# Patient Record
Sex: Female | Born: 1970 | Race: White | Smoking: Never smoker
Health system: Northeastern US, Community
[De-identification: ages and names within clinical notes are randomized; demographics above are authoritative.]

## PROBLEM LIST (undated history)

## (undated) DIAGNOSIS — L719 Rosacea, unspecified: Secondary | ICD-10-CM

## (undated) DIAGNOSIS — B029 Zoster without complications: Secondary | ICD-10-CM

## (undated) DIAGNOSIS — E039 Hypothyroidism, unspecified: Secondary | ICD-10-CM

## (undated) DIAGNOSIS — F419 Anxiety disorder, unspecified: Secondary | ICD-10-CM

## (undated) DIAGNOSIS — F329 Major depressive disorder, single episode, unspecified: Secondary | ICD-10-CM

## (undated) DIAGNOSIS — F32A Depression, unspecified: Secondary | ICD-10-CM

## (undated) DIAGNOSIS — I10 Essential (primary) hypertension: Secondary | ICD-10-CM

## (undated) DIAGNOSIS — G90529 Complex regional pain syndrome I of unspecified lower limb: Secondary | ICD-10-CM

## (undated) HISTORY — DX: Hypothyroidism, unspecified: E03.9

## (undated) HISTORY — DX: Anxiety disorder, unspecified: F41.9

## (undated) HISTORY — PX: OB ANTEPARTUM CARE CESAREAN DLVR & POSTPARTUM: REP299

## (undated) HISTORY — PX: TOTAL ABDOMINAL HYSTERECT W/WO RMVL TUBE OVARY: REP152

## (undated) HISTORY — DX: Essential (primary) hypertension: I10

## (undated) HISTORY — DX: Rosacea, unspecified: L71.9

## (undated) HISTORY — PX: ROTATOR CUFF REPAIR: 100040

## (undated) HISTORY — DX: Major depressive disorder, single episode, unspecified: F32.9

## (undated) HISTORY — PX: ACL REPAIR: 100048

## (undated) HISTORY — DX: Depression, unspecified: F32.A

---
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
## 2009-06-17 ENCOUNTER — Encounter (HOSPITAL_BASED_OUTPATIENT_CLINIC_OR_DEPARTMENT_OTHER): Payer: Self-pay | Admitting: Internal Medicine

## 2009-06-17 ENCOUNTER — Telehealth (HOSPITAL_BASED_OUTPATIENT_CLINIC_OR_DEPARTMENT_OTHER): Payer: Self-pay

## 2009-06-17 ENCOUNTER — Ambulatory Visit (HOSPITAL_BASED_OUTPATIENT_CLINIC_OR_DEPARTMENT_OTHER): Payer: PRIVATE HEALTH INSURANCE | Admitting: Internal Medicine

## 2009-06-17 VITALS — BP 110/74 | HR 112 | Temp 98.7°F | Wt 148.8 lb

## 2009-06-17 DIAGNOSIS — Z0000 Encounter for general adult medical examination without abnormal findings: Secondary | ICD-10-CM

## 2009-06-17 DIAGNOSIS — IMO0002 Reserved for concepts with insufficient information to code with codable children: Secondary | ICD-10-CM

## 2009-06-17 DIAGNOSIS — N390 Urinary tract infection, site not specified: Secondary | ICD-10-CM

## 2009-06-17 DIAGNOSIS — Z717 Human immunodeficiency virus [HIV] counseling: Secondary | ICD-10-CM

## 2009-06-17 LAB — URINALYSIS
BILIRUBIN, URINE: NEGATIVE
GLUCOSE, URINE: NEGATIVE MG/DL
KETONE, URINE: NEGATIVE MG/DL
LEUKOCYTE ESTERASE: NEGATIVE
NITRITE, URINE: NEGATIVE
OCCULT BLOOD, URINE: NEGATIVE
PH URINE: 7 (ref 5.0–8.0)
PROTEIN, URINE: NEGATIVE MG/DL
SPECIFIC GRAVITY URINE: 1.01 (ref 1.003–1.035)

## 2009-06-17 LAB — URINE DIP (POINT OF CARE)
BILIRUBIN, URINE: NEGATIVE (ref 0–0)
GLUCOSE, URINE: NEGATIVE mg/dl (ref 0–0)
KETONE, URINE: NEGATIVE mg/dl (ref 0–0)
LEUKOCYTE ESTERASE: NEGATIVE (ref 0–0)
NITRITE, URINE: NEGATIVE
OCCULT BLOOD, URINE: NEGATIVE (ref 0–0)
PH URINE: 7 (ref 5.0–8.0)
PROTEIN, URINE: NEGATIVE mg/dl (ref 0–15)
SPECIFIC GRAVITY URINE: 1.02 (ref 1.003–1.030)
UROBILINOGEN URINE: 0.2 mg/dl (ref 0.2–1.0)

## 2009-06-17 MED ORDER — EPINEPHRINE 0.3 MG/0.3ML IJ DEVI
INTRAMUSCULAR | Status: AC
Start: 2009-06-17 — End: 2010-06-17

## 2009-06-17 MED ORDER — LIDODERM 5 % EX PTCH
MEDICATED_PATCH | CUTANEOUS | Status: AC
Start: 2009-06-17 — End: 2010-06-17

## 2009-06-17 MED ORDER — HYDROCHLOROTHIAZIDE 25 MG PO TABS
ORAL_TABLET | ORAL | Status: DC
Start: 2009-06-17 — End: 2010-04-01

## 2009-06-17 MED ORDER — VENTOLIN HFA 108 (90 BASE) MCG/ACT IN AERS
INHALATION_SPRAY | RESPIRATORY_TRACT | Status: DC
Start: 2009-06-17 — End: 2010-07-06

## 2009-06-17 MED ORDER — NORVASC 5 MG PO TABS
ORAL_TABLET | ORAL | Status: DC
Start: 2009-06-17 — End: 2010-07-06

## 2009-06-17 MED ORDER — B COMPLEX PO TABS
ORAL_TABLET | ORAL | Status: AC
Start: 2009-06-17 — End: 2010-06-17

## 2009-06-17 MED ORDER — ADVAIR DISKUS 500-50 MCG/DOSE IN AEPB
INHALATION_SPRAY | RESPIRATORY_TRACT | Status: DC
Start: 2009-06-17 — End: 2010-07-06

## 2009-06-17 MED ORDER — VITAMIN E 400 UNITS PO TABS
ORAL_TABLET | ORAL | Status: DC
Start: 2009-06-17 — End: 2009-06-20

## 2009-06-17 MED ORDER — TESSALON PERLES 100 MG PO CAPS
ORAL_CAPSULE | ORAL | Status: DC
Start: 2009-06-17 — End: 2009-10-23

## 2009-06-17 MED ORDER — FLONASE 50 MCG/ACT NA SUSP
NASAL | Status: AC
Start: 2009-06-17 — End: 2010-06-17

## 2009-06-17 MED ORDER — OMEGA 3 1000 MG PO CAPS
ORAL_CAPSULE | ORAL | Status: AC
Start: 2009-06-17 — End: 2010-06-17

## 2009-06-17 MED ORDER — M-VIT PO TABS
ORAL_TABLET | ORAL | Status: DC
Start: 2009-06-17 — End: 2009-06-17

## 2009-06-17 MED ORDER — ALPRAZOLAM XR 0.5 MG PO TB24
ORAL_TABLET | ORAL | Status: DC
Start: 2009-06-17 — End: 2010-03-02

## 2009-06-17 MED ORDER — LEVOTHYROXINE SODIUM 137 MCG PO TABS
ORAL_TABLET | ORAL | Status: DC
Start: 2009-06-17 — End: 2009-06-20

## 2009-06-17 MED ORDER — BENZACLIN 1-5 % EX GEL
CUTANEOUS | Status: AC
Start: 2009-06-17 — End: 2010-06-17

## 2009-06-17 MED ORDER — WELLBUTRIN XL 300 MG PO TB24
ORAL_TABLET | ORAL | Status: DC
Start: 2009-06-17 — End: 2010-07-06

## 2009-06-17 MED ORDER — VITAMIN D 1000 UNITS PO TABS
ORAL_TABLET | ORAL | Status: AC
Start: 2009-06-17 — End: 2010-06-17

## 2009-06-17 MED ORDER — ALBUTEROL SULFATE (2.5 MG/3ML) 0.083% IN NEBU
INHALATION_SOLUTION | RESPIRATORY_TRACT | Status: DC
Start: 2009-06-17 — End: 2009-10-23

## 2009-06-17 MED ORDER — HYDROXYZINE HCL 10 MG PO TABS
ORAL_TABLET | ORAL | Status: DC
Start: 2009-06-17 — End: 2010-05-09

## 2009-06-17 NOTE — Progress Notes (Signed)
Madison Mckay is a 39 year old female moved here from [place] about a year ago, and presents for new PCP appointment as well as for evaluation of recent urinary symptoms.     3 days ago she noted cloudy urine, no dysuria/frequency. OTC test revealed negative nitrates and positive leukocytes. The following day, she had crampy lower abdominal pain and mildy red urine.  No fevers, + right flank pain since Monday, constant sometimes sharp, no heavy lifting/trauma, not worse with movement.   Taking cranberry juice pills and drinking lots of fluids.       Review Of Systems    Skin: hives all the time, brought on by stress.   Eyes: negative  Ears/Nose/Throat: negative  Respiratory: no SOB, cough  Cardiovascular: negative  Gastrointestinal: sometimes get acid reflux   Genitourinary: see HPI  Musculoskeletal: negative  Neurologic: negative  Psychiatric: negative  Hematologic/Lymphatic/Immunologic: negative  Endocrine: weight gain, mild (also decreased exercize), no skin changes no hair changes no constipation.      Past Medical History    Hypertension     Hypothyroidism     Anxiety     Depression     Comment: no manic, no suicide attempts, on wellbutrin for years    Asthma     Comment: triggers = smoke, cold air, hospitalized several times, never intubated (refused once)     Endometriosis     Comment: resolved since TAH    Rosacea          Past Surgical History    TAH +-RMVL TUBE +-RMVL OVARY     Comment just TAH no ovary removal, 2005; laparascopies x 2 before TAH for endometriosis    ACL REPAIR     Comment left 2004, cadavaric acl    OB ANTEPARTUM CARE C DLVR&POSTPARTUM     Comment x2, 97 and 98    ROTATOR CUFF REPAIR     Comment Labral repair anchor not sure if plastic or metal but had MRI with this in place without problems, on right shoulder       Current outpatient prescriptions:hydrochlorothiazide (HYDRODIURIL) 25 MG TABS, 1 TABLET DAILY, Disp: 90 tablet, Rfl: 3;  amlodipine (NORVASC) 5 MG TABS, 1 TABLET DAILY,  Disp: 90 tablet, Rfl: 3;  levothyroxine (SYNTHROID, LEVOTHROID) 137 MCG TABS, 1 TABLET DAILY, Disp: 90 tablet, Rfl: 3;  hydrOXYzine (ATARAX) 10 MG TABS, 1 TABLET 4 TIMES DAILY AS NEEDED for itching, Disp: 360 tablet, Rfl: 3  fluticasone-salmeterol (ADVAIR DISKUS) 500-50 MCG/DOSE AEPB, 1 INHALATION EVERY 12 HOURS, Disp: 180 each, Rfl: 3;  VENTOLIN HFA 108 (90 BASE) MCG/ACT AERS, 1 puff every 4 hours as needed for shortness of breath, Disp: 3 Inhaler, Rfl: 3;  Vitamin E 400 UNIT TABS, 1 TABLET DAILY, Disp: 120 tablet, Rfl: 3;  Cholecalciferol (VITAMIN D) 1000 UNIT TABS, 1 TABLET DAILY, Disp: 1 tablet, Rfl: 0  b complex vitamins TABS, 1 tablet once daily, Disp: 1 capsule, Rfl: 0;  OMEGA 3 1000 MG CAPS, 3 CAPSULES DAILY WITH A MEAL, Disp: 30 capsule, Rfl: 0;  buPROPion (WELLBUTRIN XL) 300 MG TB24, 1 TABLET DAILY, Disp: 120 tablet, Rfl: 3;  lidocaine (LIDODERM) 5 % PTCH, 1 PATCH DAILY, Disp: 120 patch, Rfl: 3;  albuterol (PROVENTIL) (2.5 MG/3ML) 0.083% NEBU, 3 ML 4 TIMES DAILY, Disp: 225 mL, Rfl: 3  EPINEPHrine (EPIPEN ADULT 1:1000) 0.3 MG/0.3ML DEVI, 1 TIME ONLY, Disp: 1 Device, Rfl: 0;  alprazolam (ALPRAZOLAM XR) 0.5 MG TB24, 1 TABLET DAILY, Disp: 30 tablet, Rfl: 0;  fluticasone (FLONASE) 50 MCG/ACT SUSP, use 2 sprays intranasally every day, Disp: 36 g, Rfl: 3;  clindamycin-benzoyl peroxide (BENZACLIN) 1-5 % GEL, apply to affected area at night, Disp: 28 g, Rfl: 3  benzonatate (TESSALON PERLES) 100 MG CAPS, 1 CAPSULE 3 TIMES DAILY AS NEEDED, Disp: 120 capsule, Rfl: 3;  MVI (MULTIVITAMIN) TABS, 1 TABLET DAILY, Disp: , Rfl: 0  Review of Patient's Allergies indicates:   Penicillins             Hives   Oxycodone                   Comment:Hives, other narcotics OK   Erythromycin            Nausea Only    Comment:Stomach pain severe no nausea, can take             azithromycin                Physical Exam   Constitutional: She is oriented to person, place, and time. She appears well-developed and well-nourished. No distress.    HENT:   Right Ear: External ear normal.   Left Ear: External ear normal.   Mouth/Throat: Oropharynx is clear and moist.        Missing lower septum, mild irritation on remainder or septum   Neck: No tracheal deviation present. No thyromegaly present.   Cardiovascular: Normal rate, regular rhythm and normal heart sounds.  Exam reveals no gallop and no friction rub.    No murmur heard.  Pulmonary/Chest: Effort normal. No respiratory distress. She has no wheezes. She has no rales. She exhibits no tenderness.   Abdominal: Soft. Bowel sounds are normal. She exhibits no distension and no mass. No tenderness. She has no rebound and no guarding.        Mildly tender lower abdomen   Genitourinary:        No cvat tenderness   Musculoskeletal: She exhibits no edema and no tenderness.        Tender right lumbar region   Lymphadenopathy:     She has no cervical adenopathy.   Neurological: She is alert and oriented to person, place, and time. She displays normal reflexes. No cranial nerve deficit. She exhibits normal muscle tone.   Skin: Skin is warm and dry. No rash noted. She is not diaphoretic. No erythema. No pallor.        tatoo on nape of neck and lower back, no surrounding erythema/tenderness   Psychiatric:        Appears anxious       Madison Mckay is a 39 year old female here for physical and UTI symptoms.     Cloudy urine - resolved, Udip negative here. Reassured. Will send for ua/ucx given residual suprapubic mild tenderness.     625.0 Dyspareunia and feeling of vaginal wall laxity  Comment: reports no emotional difficulty with fiance, wants repair  Plan: REFERRAL TO OBSTETRICS (INT)          V70.0 Routine general medical examination at a health ca  Comment: no PAP indicated (TAH for benign reasons)  Plan: VITAMIN D,25 HYDROXY, THYROID STIM HORMONE, HIV        1 AND 2 PLUS O ANTIBODY    V65.44 Human immunodeficiency virus (HIV) counseling  Comment: Counselled, no questions.   Plan: testing today    Medications  refilled except for alprazolam (limited rx given). Pt is to bring in her records from [place] for verification that she  was on this medication prior,

## 2009-06-17 NOTE — Telephone Encounter (Signed)
Pt reported to pharmacist she has UTI & MD was going to order antibiotics, none ordered.  Will check with PCP for ? Order.

## 2009-06-17 NOTE — Telephone Encounter (Signed)
Message copied by Dineen Kid on Tue Jun 17, 2009  5:50 PM  ------       Message from: AMADO-LOUIS, LISA       Created: Tue Jun 17, 2009  4:15 PM       Regarding: meds questions         [facility]              Sandee Bernath [id number], 39 year old, female, Telephone Information:       Home Phone      [phone]       Work Phone      Not on file.       Mobile          [phone]                     [name] NUMBER: [phone]       Best time to call back:        Cell phone:        Other phone:              Available times:              Patient's language of care: English              Patient does not need an interpreter.              Patient's PCP: [name], MD              Person calling on behalf of patient: pharmacy              Calls today to speak to provider only.       with questions and concerns. About all meds were sent 19 total except antibiotics.        Please advise. thanks              Patient's Preferred Pharmacy:        [id number] [place]              Phone: [phone] Fax: [phone]

## 2009-06-18 LAB — URINE CULTURE/COLONY COUNT: URINE CULTURE/COLONY COUNT: NO GROWTH

## 2009-06-19 LAB — VITAMIN D,25 HYDROXY: VITAMIN D,25 HYDROXY: 22.4 ng/ml — ABNORMAL LOW (ref 30.0–100.0)

## 2009-06-19 LAB — HIV 1 AND 2 PLUS O ANTIBODY: HIV 1 AND 2 PLUS O SCREEN: NONREACTIVE

## 2009-06-19 LAB — TSH (THYROID STIMULATING HORMONE): TSH (THYROID STIM HORMONE): 0.13 u[IU]/mL — ABNORMAL LOW (ref 0.34–5.60)

## 2009-06-20 ENCOUNTER — Telehealth (HOSPITAL_BASED_OUTPATIENT_CLINIC_OR_DEPARTMENT_OTHER): Payer: Self-pay | Admitting: Ambulatory Care

## 2009-06-20 ENCOUNTER — Telehealth (HOSPITAL_BASED_OUTPATIENT_CLINIC_OR_DEPARTMENT_OTHER): Payer: Self-pay | Admitting: Internal Medicine

## 2009-06-20 DIAGNOSIS — Z0000 Encounter for general adult medical examination without abnormal findings: Secondary | ICD-10-CM

## 2009-06-20 MED ORDER — VITAMIN D (ERGOCALCIFEROL) 1.25 MG (50000 UT) PO CAPS
ORAL_CAPSULE | ORAL | Status: DC
Start: 2009-06-20 — End: 2010-03-02

## 2009-06-20 MED ORDER — SYNTHROID 125 MCG PO TABS
ORAL_TABLET | ORAL | Status: AC
Start: 2009-06-20 — End: 2010-06-20

## 2009-06-20 NOTE — Telephone Encounter (Signed)
Spoke to pharmacist and they said that the provider already called back and the issue was taken care of

## 2009-06-20 NOTE — Telephone Encounter (Signed)
I called pt to discuss labs. I recommended:  1. Decreasing syntrhoid to given her TSH is overcorrected, recheck in 6 weeks  2. Vitamin D supplementation with 50000IU x 8 weeks, recheck after completion of 8 weeks  3. Discontinuing vitamin E tablets if they are in MVI.   Pt agreed to plan. I have sent vitamin D and levothyroxine recs, in addition to placing lab requisitions for the above labs.   I also notified her that her urine studies and HIV Ab were negative.

## 2009-06-20 NOTE — Telephone Encounter (Signed)
Message copied by Milus Mallick on Fri Jun 20, 2009  1:46 PM  ------       Message from: [name]       Created: Fri Jun 20, 2009  1:40 PM       Regarding: Clarify Medication         [facility]              Madison Mckay [id number], 39 year old, female, Telephone Information:       Home Phone      [phone]       Work Phone      Not on file.       Mobile          [phone]                     [name] NUMBER: [phone]       Best time to call back: anytime       Cell phone:        Other phone:              Available times:              Patient's language of care: English              Patient does not need an interpreter.              Patient's PCP: [name], MD              Person calling on behalf of patient: Patient (self)              Calls today clarify medication:Vitamin D, Ergocalciferol, 50000 UNIT CAPS              Thanks                     Patient's Preferred Pharmacy:        [facility] [id number] [place]              Phone: [phone] Fax: [phone]

## 2009-06-24 NOTE — Telephone Encounter (Signed)
See note from 1\14\11, handled by S. Armed forces training and education officer.

## 2009-07-23 ENCOUNTER — Telehealth (HOSPITAL_BASED_OUTPATIENT_CLINIC_OR_DEPARTMENT_OTHER): Payer: Self-pay | Admitting: Registered Nurse

## 2009-07-23 NOTE — Telephone Encounter (Signed)
Call to patient. Left voicemail message to return RN call when able.

## 2009-07-23 NOTE — Telephone Encounter (Signed)
Message copied by Olevia Bowens on Wed Jul 23, 2009  8:07 AM  ------       Message from: Binnie Kand A.       Created: Tue Jul 22, 2009  7:16 PM         Can someone call patient and remind her that we are waiting on records from prior PCP- our front desk can help her. In addition, if she wants continuation of her anxiety medications, she can give you the pharmacy name where she last filled them and the phone number, and you can call the pharmacy/verify last rx date and dose, and let me know.               Thanks       [facility]

## 2009-07-23 NOTE — Telephone Encounter (Signed)
Unable to reach pt, LM with Pt's daughter Trinna Post, to tell mother to call clinic, nurse has a message from her doctor. See note bellow.

## 2009-09-30 ENCOUNTER — Ambulatory Visit (HOSPITAL_BASED_OUTPATIENT_CLINIC_OR_DEPARTMENT_OTHER): Payer: Self-pay | Admitting: Internal Medicine

## 2009-10-23 ENCOUNTER — Telehealth (HOSPITAL_BASED_OUTPATIENT_CLINIC_OR_DEPARTMENT_OTHER): Payer: Self-pay | Admitting: Internal Medicine

## 2009-10-23 ENCOUNTER — Ambulatory Visit (HOSPITAL_BASED_OUTPATIENT_CLINIC_OR_DEPARTMENT_OTHER): Payer: PRIVATE HEALTH INSURANCE | Admitting: Internal Medicine

## 2009-10-23 ENCOUNTER — Other Ambulatory Visit (HOSPITAL_BASED_OUTPATIENT_CLINIC_OR_DEPARTMENT_OTHER): Payer: Self-pay | Admitting: Internal Medicine

## 2009-10-23 ENCOUNTER — Telehealth (HOSPITAL_BASED_OUTPATIENT_CLINIC_OR_DEPARTMENT_OTHER): Payer: Self-pay | Admitting: Ambulatory Care

## 2009-10-23 ENCOUNTER — Encounter (HOSPITAL_BASED_OUTPATIENT_CLINIC_OR_DEPARTMENT_OTHER): Payer: Self-pay | Admitting: Internal Medicine

## 2009-10-23 VITALS — BP 100/74 | HR 106 | Temp 97.6°F | Resp 22 | Wt 144.0 lb

## 2009-10-23 DIAGNOSIS — J45909 Unspecified asthma, uncomplicated: Secondary | ICD-10-CM

## 2009-10-23 DIAGNOSIS — R059 Cough, unspecified: Secondary | ICD-10-CM

## 2009-10-23 DIAGNOSIS — R05 Cough: Secondary | ICD-10-CM

## 2009-10-23 MED ORDER — IPRATROPIUM-ALBUTEROL 0.5-2.5 (3) MG/3ML IN SOLN
2.50 mg | Freq: Four times a day (QID) | RESPIRATORY_TRACT | Status: AC
Start: 2009-10-23 — End: 2010-05-01

## 2009-10-23 MED ORDER — GUAIFENESIN-CODEINE 100-10 MG/5ML PO SYRP
5.0000 mL | ORAL_SOLUTION | Freq: Three times a day (TID) | ORAL | Status: AC | PRN
Start: 2009-10-23 — End: 2009-10-30

## 2009-10-23 MED ORDER — PREDNISONE 20 MG PO TABS
ORAL_TABLET | ORAL | Status: AC
Start: 2009-10-23 — End: 2009-10-28

## 2009-10-23 MED ORDER — PREDNISONE 20 MG PO TABS
20.0000 mg | ORAL_TABLET | Freq: Every day | ORAL | Status: DC
Start: 2009-10-23 — End: 2009-10-23
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
## 2009-10-23 NOTE — Telephone Encounter (Signed)
Pharmacy calling to clarify/rxs by Dr. Homero Fellers today.    1) unclear sig on prednisone - changed to prednisone 20mg  x 3 (60mg ) daily x 5 days, #15  2) rx for codeine-guaifenasin not received; tel order taken with promise that staff will fax over hard copy tomorrow morning (S Westly Pam, RN located rx in [employer] and will leave for Hortencia Pilar, MD to sign/fax tomorrow)

## 2009-10-23 NOTE — Telephone Encounter (Signed)
Called and spoke to pt and she said that she have a hx of Asthma and she recently moved from [place] and since she moved have not had any issues, but the past few days she have been having issues with asthma and using her albuterol inhaler with some relief, coughing and diff breathing at times.  Pt said that she also had a fever 100.9 taking tylenol last at 11 am with min relief.  No diff breathing at this time but states that when she starts to cough she brings up green phlegm and it makes the breathing harder.    appt given for today at 340 pm with Dr Homero Fellers.

## 2009-10-23 NOTE — Telephone Encounter (Signed)
Message copied by Milus Mallick on Thu Oct 23, 2009 11:51 AM  ------       Message from: Deirdre Pippins       Created: Thu Oct 23, 2009 10:56 AM       Regarding: Sick Call       Contact: [phone]         [facility]              Madison Mckay [id number], 39 year old, female, Telephone Information:       Home Phone      [phone]       Work Phone      Not on file.       Mobile          [phone]                     [name] NUMBER: [phone]       Best time to call back:  Any time       Cell phone:        Other phone:              Available times:              Patient's language of care: English              Patient does not need an interpreter.              Patient's PCP: [name], MD              Person calling on behalf of patient: Patient (self)              Calls today with a sick call, has Asthma,  Bad cough, hard to breath

## 2009-10-23 NOTE — Progress Notes (Signed)
Spoke with provider and verbal order given for Duoneb.  duoneb administered.

## 2009-11-14 NOTE — Progress Notes (Addendum)
Addended by: Binnie Kand A. on: 11/14/2009      Modules accepted: Orders

## 2009-11-26 ENCOUNTER — Other Ambulatory Visit (HOSPITAL_BASED_OUTPATIENT_CLINIC_OR_DEPARTMENT_OTHER): Payer: Self-pay | Admitting: Internal Medicine

## 2009-11-26 NOTE — Telephone Encounter (Signed)
Madison Mckay is a 39 year old female has requested a refill of prednisone 20 mg  Other Med Adult:  Most Recent BP Reading(s)  10/23/09 : 100/74        No results found for this basename: cholesterol:1    No results found for this basename: LDL:1    No results found for this basename: HDL:1    No results found for this basename: tg:1        No results found for this basename: TSHSC:1        THYROID STIM HORMONE (uIU/mL)   Date     Date  Value    06/17/2009  0.13*   ----------      No results found for this basename: hgba1c:1        No results found for this basename: INR:3       Documented [facility] [id number] [place] [phone] Fax: [phone]

## 2009-11-27 ENCOUNTER — Other Ambulatory Visit (HOSPITAL_BASED_OUTPATIENT_CLINIC_OR_DEPARTMENT_OTHER): Payer: Self-pay | Admitting: Internal Medicine

## 2009-11-27 NOTE — Telephone Encounter (Signed)
Called pt in response to refill request for prednisone 60mg . Asked her to make a sick visit  appointment with any provider at [facility]. If she is having difficulty she should be evaluated. If this is indeed another exacerbation, I would recommend adding singulair to her home regimen.

## 2009-12-14 ENCOUNTER — Emergency Department (HOSPITAL_BASED_OUTPATIENT_CLINIC_OR_DEPARTMENT_OTHER)
Admission: RE | Admit: 2009-12-14 | Disposition: A | Discharge status: Home / Self Care | Payer: Self-pay | Source: Emergency Department | Attending: Physician Assistant | Admitting: Physician Assistant

## 2009-12-14 ENCOUNTER — Encounter (HOSPITAL_BASED_OUTPATIENT_CLINIC_OR_DEPARTMENT_OTHER): Payer: Self-pay

## 2009-12-14 LAB — XR FOOT RIGHT MINIMUM 3 VIEWS

## 2009-12-14 MED ORDER — ACETAMINOPHEN 325 MG PO TABS
ORAL_TABLET | ORAL | Status: AC
Start: 2009-12-14 — End: 2009-12-14
  Filled 2009-12-14: qty 3

## 2009-12-14 MED ORDER — IBUPROFEN 800 MG PO TABS
800.0000 mg | ORAL_TABLET | Freq: Three times a day (TID) | ORAL | Status: DC | PRN
Start: 2009-12-14 — End: 2010-02-18

## 2009-12-14 NOTE — ED Notes (Signed)
Pt with a crushing injury to her R foot after a book shelf full of books fell and landed on her foot (instep) approx 1 hour PTA.

## 2009-12-16 ENCOUNTER — Encounter (HOSPITAL_BASED_OUTPATIENT_CLINIC_OR_DEPARTMENT_OTHER): Payer: Self-pay

## 2009-12-16 ENCOUNTER — Emergency Department (HOSPITAL_BASED_OUTPATIENT_CLINIC_OR_DEPARTMENT_OTHER)
Admission: RE | Admit: 2009-12-16 | Disposition: A | Discharge status: Home / Self Care | Payer: Self-pay | Source: Emergency Department | Admitting: Podiatrist

## 2009-12-16 ENCOUNTER — Ambulatory Visit (HOSPITAL_BASED_OUTPATIENT_CLINIC_OR_DEPARTMENT_OTHER): Payer: PRIVATE HEALTH INSURANCE | Admitting: Internal Medicine

## 2009-12-16 VITALS — BP 100/72 | HR 86 | Temp 98.6°F | Resp 20

## 2009-12-16 DIAGNOSIS — J45909 Unspecified asthma, uncomplicated: Secondary | ICD-10-CM | POA: Insufficient documentation

## 2009-12-16 DIAGNOSIS — S99921A Unspecified injury of right foot, initial encounter: Secondary | ICD-10-CM

## 2009-12-16 LAB — CHG CREATINE KINASE TOTAL: CREATINE KINASE TOTAL: 61 IU/L (ref 21–215)

## 2009-12-16 MED ORDER — TRAMADOL HCL 50 MG PO TABS
50.0000 mg | ORAL_TABLET | Freq: Four times a day (QID) | ORAL | Status: DC | PRN
Start: 2009-12-16 — End: 2009-12-23

## 2009-12-16 MED ORDER — BUPIVACAINE HCL (PF) 0.5 % IJ SOLN
INTRAMUSCULAR | Status: AC
Start: 2009-12-16 — End: 2009-12-16
  Filled 2009-12-16: qty 30

## 2009-12-16 MED ORDER — LIDOCAINE HCL 1 % IJ SOLN
INTRAMUSCULAR | Status: AC
Start: 2009-12-16 — End: 2009-12-16
  Filled 2009-12-16: qty 20

## 2009-12-16 MED ORDER — HYDROCODONE-ACETAMINOPHEN 5-500 MG PO TABS
1.00 | ORAL_TABLET | Freq: Four times a day (QID) | ORAL | Status: AC | PRN
Start: 2009-12-16 — End: 2009-12-23
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
## 2009-12-16 NOTE — ED Notes (Signed)
Bed:RA3<BR> Expected date:12/16/09<BR> Expected time: 6:03 PM<BR> Means of arrival:Self<BR> Comments:<BR> Braydee, Shimkus  [id number]  38yo F    R foot crush injury 2 days ago    Now with Worsening paresthesias    Podiatry aware and will see patient -- please page.

## 2009-12-16 NOTE — ED Notes (Signed)
Patient presents s/p right foot injury 2 days ago - to see Podiatry for increasing paresthesias.

## 2009-12-16 NOTE — Discharge Instructions (Signed)
Please remain non-weightbearing for the next 24 hours then advance weightbearing as tolerated in the surgical shoe provided until seen in the Podiatry clinic.  You may take Vicodin for pain as directed.  If you notice any change in coloration of the toes or worsening of symptoms please return to the emergency room immediately.  You will be contacted in the next 48 hours with a follow-up appointment to see podiatry.

## 2009-12-17 ENCOUNTER — Telehealth (HOSPITAL_BASED_OUTPATIENT_CLINIC_OR_DEPARTMENT_OTHER): Payer: Self-pay | Admitting: Family Medicine

## 2009-12-17 NOTE — Telephone Encounter (Signed)
Message copied by Dyke Maes on Wed Dec 17, 2009  3:41 PM  ------       Message from: Larena Glassman       Created: Wed Dec 17, 2009  3:37 PM       Regarding: Medication       Contact: [phone]         [facility]              Madison Mckay [id number], 39 year old, female, Telephone Information:       Home Phone      [phone]       Work Phone      Not on file.       Mobile          [phone]                     CALL BACK NUMBER: [phone]       Best time to call back:        Cell phone:        Other phone:              Available times:              Patient's language of care: English              Patient does not need an interpreter.              Patient's PCP: [name], MD              Person calling on behalf of patient: Patient (self)              Calls today        Pt saw Dr.Frank yesterday and was sent to the er for a foot injury,pt calling w/ questions about the medication she was given.              Patient's Preferred Pharmacy:        [facility] [id number]               Phone: [phone] Fax: [phone]

## 2009-12-17 NOTE — Telephone Encounter (Signed)
Letter completed and signed by MD.  Pt called and notified that letter is at [facility] ready to be picked up.

## 2009-12-17 NOTE — Telephone Encounter (Signed)
Call to pt.  Pt requesting letter for work stating she cannot go back to work until next week due to a foot injury.  Informed pt I will write letter and have Dr. Homero Fellers sign it.  Pt can pick it up at the clinic tomorrow.  Pt agrees with plan and verbalizes understanding.

## 2009-12-19 ENCOUNTER — Ambulatory Visit (HOSPITAL_BASED_OUTPATIENT_CLINIC_OR_DEPARTMENT_OTHER): Payer: Self-pay | Admitting: Occupational Medicine

## 2009-12-23 ENCOUNTER — Ambulatory Visit (HOSPITAL_BASED_OUTPATIENT_CLINIC_OR_DEPARTMENT_OTHER): Payer: No Typology Code available for payment source | Admitting: Podiatrist

## 2009-12-23 VITALS — BP 121/91 | Temp 98.7°F

## 2009-12-23 DIAGNOSIS — S99921A Unspecified injury of right foot, initial encounter: Secondary | ICD-10-CM

## 2009-12-23 DIAGNOSIS — M79671 Pain in right foot: Secondary | ICD-10-CM

## 2009-12-23 DIAGNOSIS — S9031XA Contusion of right foot, initial encounter: Secondary | ICD-10-CM

## 2009-12-23 LAB — SURG SPEC CLINIC NOTE

## 2009-12-23 MED ORDER — TRAMADOL HCL 50 MG PO TABS
50.0000 mg | ORAL_TABLET | Freq: Four times a day (QID) | ORAL | Status: DC | PRN
Start: 2009-12-23 — End: 2010-01-14

## 2009-12-23 NOTE — Progress Notes (Signed)
This office note has been dictated. Account number [id number]

## 2010-01-02 NOTE — Progress Notes (Addendum)
Addended by: Easter Schinke on: 01/02/2010      Modules accepted: Orders

## 2010-01-04 LAB — EMERGENCY ROOM NOTE

## 2010-01-06 ENCOUNTER — Other Ambulatory Visit (HOSPITAL_BASED_OUTPATIENT_CLINIC_OR_DEPARTMENT_OTHER): Payer: Self-pay | Admitting: Podiatrist

## 2010-01-07 ENCOUNTER — Ambulatory Visit (HOSPITAL_BASED_OUTPATIENT_CLINIC_OR_DEPARTMENT_OTHER): Payer: PRIVATE HEALTH INSURANCE

## 2010-01-07 DIAGNOSIS — S9000XA Contusion of unspecified ankle, initial encounter: Secondary | ICD-10-CM

## 2010-01-07 LAB — MRI FOOT RIGHT WO CONTRAST

## 2010-01-07 NOTE — Patient Instructions (Signed)
Rehabilitation Treatment Flowsheet    Precautions:    Date: 01/07/10         Initials: CJ         Visit #: 1         POC Due Date:          Time: 17         HEP yes         Treatment(s)                 IE complete                Pt education

## 2010-01-07 NOTE — Progress Notes (Signed)
OUTPATIENT EVALUATION    REFERRING PROVIDER: Naoma Diener, DPM  [address] - PODIATRY  [place], [place] [address]   Hx OF PRESENT ILLNESS: Patient was stacking boxes on a shelf on 12/14/09 when the shelf collapsed and everything fell onto her foot.  She was seen in the emergency room where x-ray were negative for a fracture.  She d/c to home but continued to have severe pain and excessive swelling and hematoma.  She went to her PCP where she was given a walking boot and referred to PT.  I assessed her today 01/07/10 and swelling was significantly reduced.  There were no visual deformities or discoloration, and I was able to get a dorsal pedal pulse and a post tib pulse. The patients pain was extreme with any time of compressive forces along her first met, plantar aspect of her foot along the MTP joints, and the dorsal aspect of the foot along the first met.  It was difficult to assess motion and strength 2/2 exacerbated pain. 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 the latter. She had an MRI taken yesterday and we are awaiting results.    PRECAUTIONS:    MEDICATIONS (Rx Comments, concerns): For a list of current medications review the Medication activity. See chart   LEARNS BEST: verbal   MENTAL STATUS/COMMUNICATION: WNL     PRIMARY LANGUAGE: English     REQUIRES INTERPRETER: No   INTERPRETER PRESENT DURING EVAL: No   DRESSING/GROOMING: IMPAIRED: patient has difficulty with ADLs as she is unable to bear full weight on her foot     DRIVING: IMPAIRED: patient unable to drive 2/2 pain     SLEEPING: IMPAIRED: patient wakes frequently and is unable to fall asleep due to pain and decreased activity     POSTURE/ALIGNMENT: patient unable to completely WB on R foot 2/2 pain     OTHER: patient uses a rocker boot when ambulating  outside her household, calluses noted along the plantar first hallux     NEUROLOGICAL: IMPAIRED: patient has decreased sensation along the medial border of her foot, and dorsalmedial aspect of her foot, and the lateral plantar aspect of the foot.  Sensation is Absent in the plantar and dorsal hallux.     PALPATION: incomplete assessment 2/2 pain     GAIT: antalgic with rocker boot. Patient does not pronate nor foot-flat, keeps all pressure on her heel     SKIN INTEGRITY: DRY & INTACT       Please Note: Only populated fields were assessed by provider, fields left blank were not assessed.    GIRTH LEFT RIGHT GIRTH LEFT RIGHT GIRTH LEFT RIGHT   Left and right figure 8 were equal                        OTHER:    USE IMAGES ACTIVITY. IMAGE AVAILABLE: No      LEFT RIGHT  LEFT RIGHT   SHOULD A/PROM MMT A/PROM MMT HIP A/PROM MMT A/PROM MMT   FLEX.     FLEX.       EXT.     EXT.       IR     IR       ER     ER  ABD     ABD       H. ABD     ADD       H. ADD     KNEE       ELBOW     FLEX       FLEX.     EXT       EXT.     ANKLE       WRIST     DF -5      FLEX.     PF 10      EXT.     IV 3      SUP/  PRON     EV -3         SPECIAL TEST LEFT RIGHT SPECIAL TEST LEFT RIGHT SPECIAL TEST LEFT RIGHT                                      The patients ROM and MMTs are incomplete 2/2 severity of pain.  She is unable to dorsiflex to neutral, unable to plantarflex against pressure, and unable to actively invert or evert.  PROM was extremely painful as apply any type of compressive force to any portion of her foot was pain provoking.      Physical Therapy Plan of Care    ZO:XWRU Angelique Blonder, MD  Referring Provider: Naoma Diener DPM  Diagnosis: No diagnosis found.    Assessment/Objective Findings: Patient is a 39 year old female who sustained a significant crush injury after boxes/shelves >75lbs fell onto her foot.  X-rays negative for fracture.  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  She will benefit from skilled PT to restore motion, strength, sensation, functional mobility, pain tolerance. She has been educated that neural regeneration takes significant time, and that she is encouraged to try to send as much neural input to her foot at all times during the day.  We will use electrical stimulation and other techniques to increase neural signals.  Short Term Goals:  Decrease pain to a 4/10 in 2-3 weeks  Increase ROM 25% in 3 weeks  Able to tolerate MMT in 3 weeks  Able to distinguish Sharp/Dull sensation along plantar and dorsal aspect of the foot  Long Term Goal:   Walking without walker boot in 8-12 weeks  Full sensation throughout the foot in 8-12 weeks  Full MMT and ROM in 8-12 weeks  Full functional mobility in 8-12 weeks    Treatment Plan: ** Stretching/ROM Exercise  ** Therapeutic Exercise  ** Home Exercise Program  ** Soft Tissue Mobilization  ** Electrical Stimulation/TENS  ** Hot/Cold Rx  ** Gait Training    Recommend Physical Therapy be continued 3 times per week for 12 weeks.  The rehabilitation potential for this patient is good    Patient is agreeable to treatment plan and is aware of attendance policy.    Derry Lory, PT

## 2010-01-12 ENCOUNTER — Ambulatory Visit (HOSPITAL_BASED_OUTPATIENT_CLINIC_OR_DEPARTMENT_OTHER): Payer: No Typology Code available for payment source

## 2010-01-12 DIAGNOSIS — S9000XA Contusion of unspecified ankle, initial encounter: Secondary | ICD-10-CM

## 2010-01-12 NOTE — Patient Instructions (Signed)
Rehabilitation Treatment Flowsheet    Precautions:    Date: 01/07/10 8/8        Initials: CJ CJ        Visit #: 1 2        POC Due Date:          Time: 63 30        HEP yes         Treatment(s)                 IE complete E-stim x 10 minutes               Pt education Sensory stimulation                marble pick up                Gentle weight bearing

## 2010-01-12 NOTE — Progress Notes (Signed)
S: reports pain has persisted, unable to anteriorly WB, walking with antalgic gait in walker boot  O: Refer to Rehabilitation Treatment Flowsheet  A: the patient's sensation has made slight progress. She is able to recognize light touch throughout her entire foot, but it is still weaker L vs R. She is still extremely sensitive to any pressure and has little to no motor control.  During "marble pick-up" there-ex the patient displayed slight muscular activiation.  P: continue sensory and motor neural input with desensitizing techniques as well. I will confer with OT's as to different strategies for managing nerve damage

## 2010-01-13 ENCOUNTER — Ambulatory Visit (HOSPITAL_BASED_OUTPATIENT_CLINIC_OR_DEPARTMENT_OTHER): Payer: PRIVATE HEALTH INSURANCE

## 2010-01-13 DIAGNOSIS — S9000XA Contusion of unspecified ankle, initial encounter: Secondary | ICD-10-CM

## 2010-01-13 NOTE — Patient Instructions (Signed)
Rehabilitation Treatment Flowsheet    Precautions:    Date: 01/07/10 8/8 01/13/10       Initials: Irven Easterly CJ CJ       Visit #: 1 2 3        POC Due Date:          Time: 60 30 30       HEP yes         Treatment(s)                 IE complete E-stim x 10 minutes Tens unit x 20 minutes               Pt education Sensory stimulation Sensory stimulation with different textures               marble pick up Marble pick up               Gentle weight bearing

## 2010-01-13 NOTE — Progress Notes (Signed)
S: pain has been persistent, patient reports more pins and needles versus pain    O: Refer to Rehabilitation Treatment Flowsheet  A: increased pain tolerance to passive planterflexion but still very pain with passive dorsiflexion, very TTP.  Trialing TENS unit to send home with patient for increasing pain tolerance.  Working on Sports administrator and dorsal aspects of the foot. Will try to encourage weight bearing  P: as above

## 2010-01-14 ENCOUNTER — Ambulatory Visit (HOSPITAL_BASED_OUTPATIENT_CLINIC_OR_DEPARTMENT_OTHER): Payer: PRIVATE HEALTH INSURANCE | Admitting: Podiatrist

## 2010-01-14 DIAGNOSIS — M79673 Pain in unspecified foot: Secondary | ICD-10-CM

## 2010-01-14 DIAGNOSIS — S99921A Unspecified injury of right foot, initial encounter: Secondary | ICD-10-CM

## 2010-01-14 DIAGNOSIS — T148XXA Other injury of unspecified body region, initial encounter: Secondary | ICD-10-CM

## 2010-01-14 MED ORDER — TRAMADOL HCL 50 MG PO TABS
50.0000 mg | ORAL_TABLET | Freq: Four times a day (QID) | ORAL | Status: DC | PRN
Start: 2010-01-14 — End: 2010-02-18

## 2010-01-14 MED ORDER — METHYLPREDNISOLONE 4 MG PO KIT
PACK | ORAL | Status: AC
Start: 2010-01-14 — End: 2010-01-21

## 2010-01-14 MED ORDER — ZOLPIDEM TARTRATE 5 MG PO TABS
5.0000 mg | ORAL_TABLET | Freq: Every evening | ORAL | Status: DC | PRN
Start: 2010-01-14 — End: 2010-01-28

## 2010-01-14 NOTE — Progress Notes (Signed)
This is a 39 year old female who presents to the to the clinic for follow up after dropping a heavy metal shelf on her right foot on August 10th. Patient was ruled out for compartment syndrome and has been treated conservatively with physical therapy and a short CAM walker. Patient states that she is unable to feel anything distal to the balls of her feet plantarly and distal to the sulcus of the toes dorsally. The patient also states that TENS unit does not illicit any sensations at the plantar right foot. Patient also states that she has been having issues with falling asleep and staying asleep throughout the night due to the pain- stating she would like a prescription for a sleep aid and more tramadol.     ROS: unremarkable, no NVFCD, SOB/CP    Allergies: PCN, Oxycodone, Erythromycin    PMH/Meds reviewed with patient.     PE: A&Ox3, NAD - ambulating in short leg CAM walker  Pedal pulses palpable.  CFT < 3 seconds, no edema or erythema. No open wounds, no varicosities noted. Pain upon palpation at the dorsum of the foot- mild ecchymosis noted to the site of injury. Patient is unable to flex toes and light touch sensation decreased to the plantar and dorsal aspect of the forefoot.    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   -patient examined and evaluated.   -medrol dose pak and Tramadol Rx dispensed  -Ambien 5mg  Rx dispensed  -Neuro consult made for [facility]  -order entered for PT consult x 1 month  -letter for work   -RTC in 2 weeks

## 2010-01-15 NOTE — Progress Notes (Signed)
Patient was seen with resident Dr. Oh.  I concur with their assessment and treatment plan.

## 2010-01-16 ENCOUNTER — Ambulatory Visit (HOSPITAL_BASED_OUTPATIENT_CLINIC_OR_DEPARTMENT_OTHER): Payer: No Typology Code available for payment source

## 2010-01-16 DIAGNOSIS — S9000XA Contusion of unspecified ankle, initial encounter: Secondary | ICD-10-CM

## 2010-01-16 NOTE — Patient Instructions (Signed)
Rehabilitation Treatment Flowsheet    Precautions:    Date: 01/07/10 8/8 01/13/10 01/16/10      Initials: Irven Easterly CJ CJ CJ      Visit #: 1 2 3 4       POC Due Date:          Time: 75 30 30 30       HEP yes         Treatment(s)                 IE complete E-stim x 10 minutes Tens unit x 20 minutes  x             Pt education Sensory stimulation Sensory stimulation with different textures x              marble pick up Marble pick up x              Gentle weight bearing  Weight bearing against PB

## 2010-01-16 NOTE — Progress Notes (Signed)
S: patient states pain has been consistent, although she is displaying increased motion in her foot   O: Refer to Rehabilitation Treatment Flowsheet  A: she went for a follow up with her doctor who agrees that a TENS unit would be beneficial for her to use at home.  I will fill out the necessary paperwork and send it out for her to receive a unit.    P: as above

## 2010-01-19 ENCOUNTER — Ambulatory Visit (HOSPITAL_BASED_OUTPATIENT_CLINIC_OR_DEPARTMENT_OTHER): Payer: No Typology Code available for payment source

## 2010-01-19 DIAGNOSIS — S9000XA Contusion of unspecified ankle, initial encounter: Secondary | ICD-10-CM

## 2010-01-19 NOTE — Progress Notes (Signed)
S:Pt stated 8/10 pain in in Left Foot.  O: Refer to rehab Tx flowsheet.  A: Pt continues to have diffuse pain in L foot.  Working with TENS unit and desensitization.  Pt continues to have shooting pain up her lower leg with any contact on the plantar surface of her big toe, first med head and 4th med head.    P: Continue with PWB and desensitization.

## 2010-01-20 ENCOUNTER — Encounter (HOSPITAL_BASED_OUTPATIENT_CLINIC_OR_DEPARTMENT_OTHER): Payer: No Typology Code available for payment source | Admitting: Orthopaedic Surgery

## 2010-01-21 ENCOUNTER — Ambulatory Visit (HOSPITAL_BASED_OUTPATIENT_CLINIC_OR_DEPARTMENT_OTHER): Payer: No Typology Code available for payment source

## 2010-01-21 DIAGNOSIS — S9000XA Contusion of unspecified ankle, initial encounter: Secondary | ICD-10-CM

## 2010-01-21 NOTE — Progress Notes (Signed)
.    S: patient reports minor movements in her toes and ankle  O: Refer to Rehabilitation Treatment Flowsheet  A: 9-20 dorsiflexion-planterflexion actively.  She was given TENS unit to take home with her and encouraged to use it frequently.  We trialed walking in the parallel bars today which was painful but manageable.  Shooting pain with pressure to the met heads, soreness on the dorsal side. Unable to perform marble pick up.  P: continue with POC

## 2010-01-21 NOTE — Patient Instructions (Signed)
Rehabilitation Treatment Flowsheet    Precautions:    Date: 01/07/10 8/8 01/13/10 01/16/10 01/21/10     Initials: CJ CJ CJ CJ CJ     Visit #: 1 2 3 4 5      POC Due Date:          Time: 60 30 30 30  45     HEP yes         Treatment(s)                 IE complete E-stim x 10 minutes Tens unit x 20 minutes  x x            Pt education Sensory stimulation Sensory stimulation with different textures x x             marble pick up Marble pick up x x             Gentle weight bearing  Weight bearing against PB x                Gait training in // bars

## 2010-01-26 ENCOUNTER — Ambulatory Visit (HOSPITAL_BASED_OUTPATIENT_CLINIC_OR_DEPARTMENT_OTHER): Payer: PRIVATE HEALTH INSURANCE

## 2010-01-26 DIAGNOSIS — S9000XA Contusion of unspecified ankle, initial encounter: Secondary | ICD-10-CM

## 2010-01-26 NOTE — Progress Notes (Signed)
.    S: patient continues to experience radiating pain in her feet when weight bearing on the metatarsal heads.    O: Refer to Rehabilitation Treatment Flowsheet  A: patient has increased dorsiflexion and plantarflexion. 7-35 degrees. Began using russian electrical stimulation   P: continue trying to encourage WB through approximation and desensitization

## 2010-01-26 NOTE — Patient Instructions (Signed)
Rehabilitation Treatment Flowsheet    Precautions:    Date: 01/07/10 8/8 01/13/10 01/16/10 01/21/10 01/26/10    Initials: CJ CJ CJ CJ CJ CJ    Visit #: 1 2 3 4 5 6     POC Due Date:          Time: 60 30 30 30  45     HEP yes         Treatment(s)                 IE complete E-stim x 10 minutes Tens unit x 20 minutes  x x [place] estim to ant tibilais ms.           Pt education Sensory stimulation Sensory stimulation with different textures x x x            marble pick up Marble pick up x x             Gentle weight bearing  Weight bearing against PB x Against PODs               Gait training in // bars x

## 2010-01-28 ENCOUNTER — Ambulatory Visit (HOSPITAL_BASED_OUTPATIENT_CLINIC_OR_DEPARTMENT_OTHER): Payer: PRIVATE HEALTH INSURANCE

## 2010-01-28 ENCOUNTER — Ambulatory Visit (HOSPITAL_BASED_OUTPATIENT_CLINIC_OR_DEPARTMENT_OTHER): Payer: PRIVATE HEALTH INSURANCE | Admitting: Podiatrist

## 2010-01-28 ENCOUNTER — Encounter (HOSPITAL_BASED_OUTPATIENT_CLINIC_OR_DEPARTMENT_OTHER): Payer: Self-pay | Admitting: Podiatrist

## 2010-01-28 DIAGNOSIS — M79673 Pain in unspecified foot: Secondary | ICD-10-CM

## 2010-01-28 DIAGNOSIS — M792 Neuralgia and neuritis, unspecified: Secondary | ICD-10-CM

## 2010-01-28 DIAGNOSIS — S9030XA Contusion of unspecified foot, initial encounter: Secondary | ICD-10-CM

## 2010-01-28 DIAGNOSIS — S9000XA Contusion of unspecified ankle, initial encounter: Secondary | ICD-10-CM

## 2010-01-28 MED ORDER — ZOLPIDEM TARTRATE 5 MG PO TABS
5.0000 mg | ORAL_TABLET | Freq: Every evening | ORAL | Status: AC | PRN
Start: 2010-01-28 — End: 2010-02-28

## 2010-01-28 MED ORDER — PREGABALIN 150 MG PO CAPS
150.0000 mg | ORAL_CAPSULE | Freq: Two times a day (BID) | ORAL | Status: DC
Start: 2010-01-28 — End: 2010-02-18

## 2010-01-28 NOTE — Progress Notes (Signed)
Subjective: 39 year old female patient presents to clinic for on-going care to Right foot after dropping a heavy metal shelf on her foot on December 14, 2009 at work. Patient states that she is still having pins and needle feelings in her Right heel and distally under the ball of her foot with bearing weight. Patient also complains of shooting pain that from time to time wakes her up at night. Patient has been taking RX Tramadol and Ambien with some relief; pain still wakes her up at night requiring her to take more Ambien. Patient also states that the Medrol dose pack that was Rx last visit helped. Patient has also been using her TENS unit for 2hrs a day with some relief. Patient has had TENS unit at home for about 1 week now. Patient also states that Physical therapy is helping her ability to be more active and tolerable to the pain. She is now walking at least a mile a day and doing the thera-band stretches. Patient ranks pain 6.5/10. Patient denies any other consitutional symptoms.    Review of Patient's Allergies indicates:   Penicillins             Hives   Oxycodone                   Comment:Hives, other narcotics OK   Erythromycin            Nausea Only    Comment:Stomach pain severe no nausea, can take             azithromycin      Past Medical History    Hypertension     Hypothyroidism     Anxiety     Depression     Comment: no manic, no suicide attempts, on wellbutrin for years    Asthma     Comment: triggers = smoke, cold air, hospitalized several times, never intubated (refused once)     Endometriosis     Comment: resolved since TAH    Rosacea            Past Surgical History    TAH +-RMVL TUBE +-RMVL OVARY     Comment just TAH no ovary removal, 2005; laparascopies x 2 before TAH for endometriosis    ACL REPAIR     Comment left 2004, cadavaric acl    OB ANTEPARTUM CARE C DLVR&POSTPARTUM     Comment x2, 97 and 98    ROTATOR CUFF REPAIR     Comment Labral repair anchor not sure if plastic or metal  but had MRI with this in place without problems, on right shoulder         Current outpatient prescriptions ordered prior to encounter:  traMADol (ULTRAM) 50 MG tablet Take 1 tablet by mouth every 6 (six) hours as needed for Pain. Disp: 30 tablet Rfl: 0   ibuprofen (MOTRIN) 800 MG tablet Take 1 tablet by mouth every 8 (eight) hours as needed for Pain. with food or milk Disp: 20 tablet Rfl: 0   ALBUTEROL-IPRATROPIUM (DUO-NEB) 0.5-2.5 (3) MG/3ML SOLN Inhalation Solution Inhale 3 mLs into the lungs 4 (four) times daily. Disp: 360 mL Rfl: 1   levothyroxine (SYNTHROID) 125 MCG TABS 1 TABLET DAILY Disp: 90 tablet Rfl: 3   hydrochlorothiazide (HYDRODIURIL) 25 MG TABS 1 TABLET DAILY Disp: 90 tablet Rfl: 3   amlodipine (NORVASC) 5 MG TABS 1 TABLET DAILY Disp: 90 tablet Rfl: 3   hydrOXYzine (ATARAX) 10 MG TABS 1 TABLET 4  TIMES DAILY AS NEEDED for itching Disp: 360 tablet Rfl: 3   fluticasone-salmeterol (ADVAIR DISKUS) 500-50 MCG/DOSE AEPB 1 INHALATION EVERY 12 HOURS Disp: 180 each Rfl: 3   VENTOLIN HFA 108 (90 BASE) MCG/ACT AERS 1 puff every 4 hours as needed for shortness of breath Disp: 3 Inhaler Rfl: 3   Cholecalciferol (VITAMIN D) 1000 UNIT TABS 1 TABLET DAILY Disp: 1 tablet Rfl: 0   b complex vitamins TABS 1 tablet once daily Disp: 1 capsule Rfl: 0   OMEGA 3 1000 MG CAPS 3 CAPSULES DAILY WITH A MEAL Disp: 30 capsule Rfl: 0   buPROPion (WELLBUTRIN XL) 300 MG TB24 1 TABLET DAILY Disp: 120 tablet Rfl: 3   lidocaine (LIDODERM) 5 % PTCH 1 PATCH DAILY Disp: 120 patch Rfl: 3   EPINEPHrine (EPIPEN ADULT 1:1000) 0.3 MG/0.3ML DEVI 1 TIME ONLY Disp: 1 Device Rfl: 0   alprazolam (ALPRAZOLAM XR) 0.5 MG TB24 1 TABLET DAILY Disp: 30 tablet Rfl: 0   fluticasone (FLONASE) 50 MCG/ACT SUSP use 2 sprays intranasally every day Disp: 36 g Rfl: 3   clindamycin-benzoyl peroxide (BENZACLIN) 1-5 % GEL apply to affected area at night Disp: 28 g Rfl: 3   MVI (MULTIVITAMIN) TABS 1 TABLET DAILY Disp:  Rfl: 0   pregabalin (LYRICA) 150 MG capsule Take  1 capsule by mouth 2 (two) times daily. Disp: 60 capsule Rfl: 1   Vitamin D, Ergocalciferol, 50000 UNIT CAPS 1 CAPSULE weekly x 8 weeks Disp: 8 capsule Rfl: 0       Objective: Patient is assisted by her husband and ambulates into clinic with CAM walker on Right and crocs sandal on her left foot. Patient is AAOx3, NAD. Nails 1-5 on Right are well manicured. Skin of normal color, texture, and turgor on Right. No open lesions, ecchymosis, rashes, or webspace macerations noted on Right. Skin temperature warm to cool from proximal to distal on Right. Dorsalis pedis and posterior tibialis pedal pulses 1/4 on Right. Capillary fill time <3 seconds in all 5 digits on Right. Sensate grossly to light touch. Pain with plantarflexion of toes 1-5 on Right. Pain and tingling sensations with palpation plantar surface of heel, sub-metatarsals 1-5, and toes distally.     Assessment: s/p Right foot injury (heavy object trauma- December 14, 2009) with neuropraxia     Plan: Patient seen and evaluated. Rx: Ambien 5mg , Rx: Lyrica 150mg - patient advised to take at a titrated dose until side effects (dizziness, etc) if any are tolerable. NCV consult scheduled for 02/28/2010. A note was written to extend physical therapy for another 3 weeks. A note was also written to return to work on 01/29/2010 with restrictions. Patient to return to clinic x 3 weeks following physical therapy.

## 2010-01-28 NOTE — Progress Notes (Signed)
S: patient reports noticing increased motion in her foot.  O: Refer to Rehabilitation Treatment Flowsheet  A: DF-PF 6-36. Able to perform B weightbearing in the // bars  P: continue desensitizing and increasing ROM/weight bearing

## 2010-01-28 NOTE — Patient Instructions (Signed)
Rehabilitation Treatment Flowsheet    Precautions:    Date: 01/07/10 8/8 01/13/10 01/16/10 01/21/10 01/26/10 01/28/10   Initials: CJ CJ CJ CJ CJ CJ CJ   Visit #: 1 2 3 4 5 6 7    POC Due Date:          Time: 8 30 30 30  45  45   HEP yes         Treatment(s)                 IE complete E-stim x 10 minutes Tens unit x 20 minutes  x x [place] estim to ant tibilais ms. x          Pt education Sensory stimulation Sensory stimulation with different textures x x x x           marble pick up Marble pick up x x             Gentle weight bearing  Weight bearing against PB x Against PODs x              Gait training in // bars x x                STM to plantar foot

## 2010-01-28 NOTE — Progress Notes (Signed)
Patient was seen with PMS4 Stover.  I concur with their assessment and treatment plan.

## 2010-01-30 ENCOUNTER — Ambulatory Visit (HOSPITAL_BASED_OUTPATIENT_CLINIC_OR_DEPARTMENT_OTHER): Payer: No Typology Code available for payment source

## 2010-01-30 DIAGNOSIS — S9000XA Contusion of unspecified ankle, initial encounter: Secondary | ICD-10-CM

## 2010-01-30 NOTE — Patient Instructions (Signed)
Rehabilitation Treatment Flowsheet    Precautions:    Date: 01/30/10         Initials: CJ         Visit #: 8         POC Due Date: 02/07/10         Time: 40         HEP          Treatment(s)                 Marble pick up                Calc/STJ mobs                AA DF/PF with overpressure                Sitting weight bearing ex                Walking in //bars                                                                             Rehabilitation Treatment Flowsheet    Precautions:    Date: 01/07/10 8/8 01/13/10 01/16/10 01/21/10 01/26/10 01/28/10   Initials: CJ CJ CJ CJ CJ CJ CJ   Visit #: 1 2 3 4 5 6 7    POC Due Date:          Time: 60 30 30 30  45  45   HEP yes         Treatment(s)                 IE complete E-stim x 10 minutes Tens unit x 20 minutes  x x [place] estim to ant tibilais ms. x          Pt education Sensory stimulation Sensory stimulation with different textures x x x x           marble pick up Marble pick up x x             Gentle weight bearing  Weight bearing against PB x Against PODs x              Gait training in // bars x x                STM to plantar foot

## 2010-01-30 NOTE — Progress Notes (Signed)
S: patient reports pins and needles in her foot recently, has been using TENS unit at home for pain relief and neural stimulation  O: Refer to Rehabilitation Treatment Flowsheet  A: performed some aggressive mobilzation to calc, flexor retinaculum and AA plantarflexion/dorsiflexion.  All motions extremely painful  P: continue with POC

## 2010-02-02 ENCOUNTER — Ambulatory Visit (HOSPITAL_BASED_OUTPATIENT_CLINIC_OR_DEPARTMENT_OTHER): Payer: No Typology Code available for payment source

## 2010-02-02 DIAGNOSIS — S9000XA Contusion of unspecified ankle, initial encounter: Secondary | ICD-10-CM

## 2010-02-02 NOTE — Progress Notes (Signed)
.    S: patient states increased motion in her foot, able to place more weight bearing onto the forefoot   O: Refer to Rehabilitation Treatment Flowsheet  A: 5-39 degrees of df-pf  P: continue encouraging ROM and weight bearing

## 2010-02-02 NOTE — Patient Instructions (Signed)
Rehabilitation Treatment Flowsheet    Precautions:    Date: 01/30/10 02/02/10        Initials: Irven Easterly CJ        Visit #: 8         POC Due Date: 02/07/10         Time: 40 30        HEP          Treatment(s)                 Marble pick up                Calc/STJ mobs x               AA DF/PF with overpressure x               Sitting weight bearing ex x               Walking in //bars x                                                                            Rehabilitation Treatment Flowsheet    Precautions:    Date: 01/07/10 8/8 01/13/10 01/16/10 01/21/10 01/26/10 01/28/10   Initials: CJ CJ CJ CJ CJ CJ CJ   Visit #: 1 2 3 4 5 6 7    POC Due Date:          Time: 60 30 30 30  45  45   HEP yes         Treatment(s)                 IE complete E-stim x 10 minutes Tens unit x 20 minutes  x x [place] estim to ant tibilais ms. x          Pt education Sensory stimulation Sensory stimulation with different textures x x x x           marble pick up Marble pick up x x             Gentle weight bearing  Weight bearing against PB x Against PODs x              Gait training in // bars x x                STM to plantar foot

## 2010-02-03 ENCOUNTER — Ambulatory Visit (HOSPITAL_BASED_OUTPATIENT_CLINIC_OR_DEPARTMENT_OTHER): Payer: No Typology Code available for payment source | Admitting: Rehabilitative and Restorative Service Providers"

## 2010-02-06 ENCOUNTER — Ambulatory Visit (HOSPITAL_BASED_OUTPATIENT_CLINIC_OR_DEPARTMENT_OTHER): Payer: No Typology Code available for payment source

## 2010-02-11 ENCOUNTER — Ambulatory Visit (HOSPITAL_BASED_OUTPATIENT_CLINIC_OR_DEPARTMENT_OTHER): Payer: PRIVATE HEALTH INSURANCE

## 2010-02-11 DIAGNOSIS — S9030XA Contusion of unspecified foot, initial encounter: Secondary | ICD-10-CM

## 2010-02-11 NOTE — Progress Notes (Signed)
[name]  OUTPATIENT Re-EVALUATION    REFERRING PROVIDER: Naoma Diener, DPM  [address] - PODIATRY  [place], [place] [address]   Hx OF PRESENT ILLNESS: Patient was stacking boxes on a shelf on 12/14/09 when the shelf collapsed and everything fell onto her foot.  She was seen in the emergency room where x-ray were negative for a fracture.  She d/c to home but continued to have severe pain and excessive swelling and hematoma.  She went to her PCP where she was given a walking boot and referred to PT.  I assessed her today 01/07/10 and swelling was significantly reduced.  There were no visual deformities or discoloration, and I was able to get a dorsal pedal pulse and a post tib pulse. The patients pain was extreme with any time of compressive forces along her first met, plantar aspect of her foot along the MTP joints, and the dorsal aspect of the foot along the first met.  It was difficult to assess motion and strength 2/2 exacerbated pain. 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 the latter. She had an MRI taken yesterday and we are awaiting results.     02/11/10  Patient has made good progress in her therapy. She is displaying significant ROM improvements, increased pain threshold, increased sensation, and increased WB capacity.  Today however she comes in poorly rested, with her foot atrophied, stiff and discolored.  The patient has recently returned to work where she is on her feet 40+ hours a week, and is unable to take her boot off until she gets home. She has noticed increased swelling at night, increased pain, and difficulty falling asleep. All this is accountable to her work schedule. I spoke at length with the patient, and emailed Dr. Rosilyn Mings about discontinuing her rigorous work schedule. I will follow up.    PRECAUTIONS:    MEDICATIONS (Rx Comments, concerns): For a list of current medications review the Medication activity. See chart   LEARNS BEST: verbal   MENTAL STATUS/COMMUNICATION: WNL     PRIMARY LANGUAGE: English     REQUIRES INTERPRETER: No   INTERPRETER PRESENT DURING EVAL: No   DRESSING/GROOMING: IMPAIRED: patient has difficulty with ADLs as she is unable to bear full weight on her foot     DRIVING: IMPAIRED: patient unable to drive 2/2 pain     SLEEPING: IMPAIRED: patient wakes frequently and is unable to fall asleep due to pain and decreased activity    02/11/10  Patient reported better sleeping habits lately, up until this past week     POSTURE/ALIGNMENT: patient unable to completely WB on R foot 2/2 pain     OTHER: patient uses a rocker boot when ambulating outside her household, calluses noted along the plantar first hallux    02/11/10  Patient's callus pattern significantly decreased.      NEUROLOGICAL: IMPAIRED: patient has decreased sensation along the medial border of her foot, and dorsalmedial aspect of her foot, and the lateral plantar aspect of the foot.  Sensation is Absent in the plantar and dorsal hallux.    02/11/10  Patient with improved light touch sensation plantar aspect of foot. Sensation intact on the hallux.  Patient with improved sharp/dull recognition     PALPATION: incomplete assessment 2/2 pain    02/11/10  Patient with increased sensitive to palpation plantar aspect of foot. Stiff tibialias anterior tendon, peroneals and achilles tendons.   GAIT: antalgic with rocker boot. Patient does not pronate nor foot-flat, keeps all pressure on her heel    02/11/10  Patient has displayed increased WB capacity. Still unable to completely pronate or toe-off   SKIN INTEGRITY: DRY & INTACT       Please Note: Only populated fields were assessed by provider, fields left blank were not assessed.    GIRTH LEFT RIGHT GIRTH LEFT RIGHT GIRTH LEFT RIGHT   Left and right figure 8 were equal           <1 cm diff R  vs L             OTHER:    USE IMAGES ACTIVITY. IMAGE AVAILABLE: No      LEFT RIGHT  LEFT RIGHT   SHOULD A/PROM MMT A/PROM MMT HIP A/PROM MMT A/PROM MMT   FLEX.     FLEX.       EXT.     EXT.       IR     IR       ER     ER       ABD     ABD       H. ABD     ADD       H. ADD     KNEE       ELBOW     FLEX       FLEX.     EXT       EXT.     ANKLE       WRIST     DF   -2    FLEX.     PF   35    EXT.     IV   10    SUP/  PRON     EV   0       SPECIAL TEST LEFT RIGHT SPECIAL TEST LEFT RIGHT SPECIAL TEST LEFT RIGHT                                          Physical Therapy Plan of Care    ZO:[id number] Angelique Blonder, MD  Referring Provider: Naoma Diener DPM  Diagnosis: No diagnosis found.    Assessment/Objective Findings: Patient is a 39 year old female who sustained a significant crush injury after boxes/shelves >75lbs fell onto her foot.  X-rays negative for fracture.  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  She will benefit from skilled PT to restore motion, strength, sensation, functional mobility, pain tolerance. She has been educated that neural regeneration takes significant time, and that she is encouraged to try to send as much neural input to her foot at all times during the day.  We will use electrical stimulation and other techniques to increase neural signals.    The patient has progressing well with her rehabilitation. She displays increased ROM, increased WB capacity, increased sensation, increased joint mobility and increased functional mobility.  The patient continues to have difficulty fully WB due to pain, still has pins in needles in her foot, and has some decreased senstion and strength in her foot and ankle. Most recently she has had minor setback due to her rejoining her  work, but we will address that with her MD and work. She will continue to benefit from skilled PT to address her deficits and return her foot to function.    Short  Term Goals:  Decrease pain to a 4/10 in 2-3 weeks  Increase ROM 25% in 3 weeks met  Able to tolerate MMT in 3 weeks not met  Able to distinguish Sharp/Dull sensation along plantar and dorsal aspect of the foot met  Long Term Goal:   Walking without walker boot in 8-12 weeks not met  Full sensation throughout the foot in 8-12 weeks not met  Full MMT and ROM in 8-12 weeks not met  Full functional mobility in 8-12 weeks not met    Treatment Plan: ** Stretching/ROM Exercise  ** Therapeutic Exercise  ** Home Exercise Program  ** Soft Tissue Mobilization  ** Electrical Stimulation/TENS  ** Hot/Cold Rx  ** Gait Training    Recommend Physical Therapy be continued 2-3 times per week for 12 weeks.  The rehabilitation potential for this patient is good    Patient is agreeable to treatment plan and is aware of attendance policy.    Derry Lory, PT

## 2010-02-11 NOTE — Patient Instructions (Addendum)
Rehabilitation Treatment Flowsheet    Precautions:    Date: 01/30/10 02/02/10 02/11/10       Initials: Trenton Gammon CJ       Visit #: 8         POC Due Date: 02/07/10         Time: 40 30 30       HEP          Treatment(s)                 Marble pick up                Calc/STJ mobs x               AA DF/PF with overpressure x               Sitting weight bearing ex x               Walking in //bars x                                                                            Rehabilitation Treatment Flowsheet    Precautions:    Date: 01/07/10 8/8 01/13/10 01/16/10 01/21/10 01/26/10 01/28/10   Initials: CJ CJ CJ CJ CJ CJ CJ   Visit #: 1 2 3 4 5 6 7    POC Due Date:          Time: 60 30 30 30  45  45   HEP yes         Treatment(s)                 IE complete E-stim x 10 minutes Tens unit x 20 minutes  x x [place] estim to ant tibilais ms. x          Pt education Sensory stimulation Sensory stimulation with different textures x x x x           marble pick up Marble pick up x x             Gentle weight bearing  Weight bearing against PB x Against PODs x              Gait training in // bars x x                STM to plantar foot                                                                                       Rehabilitation Treatment Flowsheet    Precautions:    Date: 01/30/10 02/02/10 02/07/10       Initials: Trenton Gammon CJ       Visit #: 8         POC Due Date: 02/07/10         Time: 40 30 30  HEP          Treatment(s)                 Marble pick up  Re-eval complete              Calc/STJ mobs x STM, gentle ROM              AA DF/PF with overpressure x               Sitting weight bearing ex x               Walking in //bars x                                                                            Rehabilitation Treatment Flowsheet    Precautions:    Date: 01/07/10 8/8 01/13/10 01/16/10 01/21/10 01/26/10 01/28/10   Initials: CJ CJ CJ CJ CJ CJ CJ   Visit #: 1 2 3 4 5 6 7    POC Due Date:          Time: 60 30 30 30  45  45   HEP yes         Treatment(s)                  IE complete E-stim x 10 minutes Tens unit x 20 minutes  x x [place] estim to ant tibilais ms. x          Pt education Sensory stimulation Sensory stimulation with different textures x x x x           marble pick up Marble pick up x x             Gentle weight bearing  Weight bearing against PB x Against PODs x              Gait training in // bars x x                STM to plantar foot

## 2010-02-11 NOTE — Progress Notes (Deleted)
.    S: patient with poor mobility in her foot today. Reports loss of sleep. Reports spending prolonged periods on her feet, in the boot. Noted atrophy. Noted stiffness. Decreased AROM. Unable to perform any therapy today due to exacerbation of pain, and poor physical and general and emotional health. Will re-eval next tx.   O: Refer to Rehabilitation Treatment Flowsheet  A: discussed at length the detriments of her returning to work. Emailed Dr. Rosilyn Mings. Advised to rest her foot as much as possible. Possibly cancel next appointment in lieu of recent setback  P: as above

## 2010-02-13 ENCOUNTER — Ambulatory Visit (HOSPITAL_BASED_OUTPATIENT_CLINIC_OR_DEPARTMENT_OTHER): Payer: No Typology Code available for payment source

## 2010-02-13 DIAGNOSIS — S9030XA Contusion of unspecified foot, initial encounter: Secondary | ICD-10-CM

## 2010-02-13 NOTE — Progress Notes (Deleted)
[name]  OUTPATIENT RE-EVALUATION    REFERRING PROVIDER: Naoma Diener, DPM  [address] - PODIATRY  [place], [place] [address]   Hx OF PRESENT ILLNESS: Patient was stacking boxes on a shelf on 12/14/09 when the shelf collapsed and everything fell onto her foot.  She was seen in the emergency room where x-ray were negative for a fracture.  She d/c to home but continued to have severe pain and excessive swelling and hematoma.  She went to her PCP where she was given a walking boot and referred to PT.  I assessed her today 01/07/10 and swelling was significantly reduced.  There were no visual deformities or discoloration, and I was able to get a dorsal pedal pulse and a post tib pulse. The patients pain was extreme with any time of compressive forces along her first met, plantar aspect of her foot along the MTP joints, and the dorsal aspect of the foot along the first met.  It was difficult to assess motion and strength 2/2 exacerbated pain. 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 the latter. She had an MRI taken yesterday and we are awaiting results.     02/13/10  Patient has made progress in her rehab. She displays an increased pain tolerance, much greater ROM, increased muscle bulk and increased weight bearing capacity. She states she has been wearing her TENS unit consistently for pain and neural regeneration. She has to continue walking in her walker boot for protection as her foot continues to be extremely sensitive to impact. The patient returned to work, however it was obviously detrimental to her health as her foot function sharply decreased in the clinic.   PRECAUTIONS:    MEDICATIONS (Rx Comments, concerns): For a list of current medications review the Medication activity. See chart   LEARNS BEST: verbal   MENTAL  STATUS/COMMUNICATION: WNL     PRIMARY LANGUAGE: English     REQUIRES INTERPRETER: No   INTERPRETER PRESENT DURING EVAL: No   DRESSING/GROOMING: IMPAIRED: patient has difficulty with ADLs as she is unable to bear full weight on her foot     DRIVING: IMPAIRED: patient unable to drive 2/2 pain     SLEEPING: IMPAIRED: patient wakes frequently and is unable to fall asleep due to pain and decreased activity    02/13/10  Improved sleeping patterns, still wakes occasionally due to pain   POSTURE/ALIGNMENT: patient unable to completely WB on R foot 2/2 pain     OTHER: patient uses a rocker boot when ambulating outside her household, calluses noted along the plantar first hallux    02/13/10  No callus pattern     NEUROLOGICAL: IMPAIRED: patient has decreased sensation along the medial border of her foot, and dorsalmedial aspect of her foot, and the lateral plantar aspect of the foot.  Sensation is Absent in the plantar and dorsal hallux.     PALPATION: incomplete assessment 2/2 pain     GAIT: antalgic with rocker boot. Patient does not pronate nor foot-flat, keeps all pressure on her heel     SKIN INTEGRITY: DRY & INTACT       Please Note: Only populated fields were assessed by provider, fields left blank were not assessed.    GIRTH LEFT RIGHT GIRTH LEFT RIGHT GIRTH LEFT RIGHT  Left and right figure 8 were equal                        OTHER:    USE IMAGES ACTIVITY. IMAGE AVAILABLE: No      LEFT RIGHT  LEFT RIGHT   SHOULD A/PROM MMT A/PROM MMT HIP A/PROM MMT A/PROM MMT   FLEX.     FLEX.       EXT.     EXT.       IR     IR       ER     ER       ABD     ABD       H. ABD     ADD       H. ADD     KNEE       ELBOW     FLEX       FLEX.     EXT       EXT.     ANKLE       WRIST     DF -5      FLEX.     PF 10      EXT.     IV 3      SUP/  PRON     EV -3         SPECIAL TEST LEFT RIGHT SPECIAL TEST LEFT RIGHT SPECIAL TEST LEFT RIGHT                                      The patients ROM and MMTs are incomplete 2/2 severity of pain.  She is  unable to dorsiflex to neutral, unable to plantarflex against pressure, and unable to actively invert or evert.  PROM was extremely painful as apply any type of compressive force to any portion of her foot was pain provoking.      Physical Therapy Plan of Care    IH:[id number] Angelique Blonder, MD  Referring Provider: Naoma Diener DPM  Diagnosis: No diagnosis found.    Assessment/Objective Findings: Patient is a 39 year old female who sustained a significant crush injury after boxes/shelves >75lbs fell onto her foot.  X-rays negative for fracture.  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  She will benefit from skilled PT to restore motion, strength, sensation, functional mobility, pain tolerance. She has been educated that neural regeneration takes significant time, and that she is encouraged to try to send as much neural input to her foot at all times during the day.  We will use electrical stimulation and other techniques to increase neural signals.  Short Term Goals:  Decrease pain to a 4/10 in 2-3 weeks  Increase ROM 25% in 3 weeks  Able to tolerate MMT in 3 weeks  Able to distinguish Sharp/Dull sensation along plantar and dorsal aspect of the foot  Long Term Goal:   Walking without walker boot in 8-12 weeks  Full sensation throughout the foot in 8-12 weeks  Full MMT and ROM in 8-12 weeks  Full functional mobility in 8-12 weeks    Treatment Plan: ** Stretching/ROM Exercise  ** Therapeutic Exercise  ** Home Exercise Program  ** Soft Tissue Mobilization  ** Electrical Stimulation/TENS  ** Hot/Cold Rx  ** Gait Training    Recommend Physical Therapy be  continued 3 times per week for 12 weeks.  The rehabilitation potential for this patient is good    Patient is agreeable to treatment plan and is aware of attendance policy.    Derry Lory, PT

## 2010-02-13 NOTE — Progress Notes (Signed)
.    S: patient's foot in much better condition vs. Last treatment. She has no callus patterns, her tendons are less stiff and normal color has been restored  O: Refer to Rehabilitation Treatment Flowsheet  A: continue encouraging weight bearing, trial hot/cold sharp/dull sensation testing  P: as above

## 2010-02-13 NOTE — Patient Instructions (Signed)
Rehabilitation Treatment Flowsheet    Precautions:    Date: 01/30/10 02/02/10 02/11/10 02/13/10      Initials: Clint Bolder CJ      Visit #: 8         POC Due Date:    03/15/10      Time: 40 30 30 45      HEP          Treatment(s)                 Marble pick up   x             Calc/STJ mobs x  x             AA DF/PF with overpressure x  x             Sitting weight bearing ex x  x             Walking in //bars x  x                MFR flexor retinaculum and ankle fascia                                                          Rehabilitation Treatment Flowsheet    Precautions:    Date: 01/07/10 8/8 01/13/10 01/16/10 01/21/10 01/26/10 01/28/10   Initials: CJ CJ CJ CJ CJ CJ CJ   Visit #: 1 2 3 4 5 6 7    POC Due Date:          Time: 60 30 30 30  45  45   HEP yes         Treatment(s)                 IE complete E-stim x 10 minutes Tens unit x 20 minutes  x x [place] estim to ant tibilais ms. x          Pt education Sensory stimulation Sensory stimulation with different textures x x x x           marble pick up Marble pick up x x             Gentle weight bearing  Weight bearing against PB x Against PODs x              Gait training in // bars x x                STM to plantar foot                                                                                       Rehabilitation Treatment Flowsheet    Precautions:    Date: 01/30/10 02/02/10 02/07/10       Initials: Trenton Gammon CJ       Visit #: 8         POC Due Date: 02/07/10  Time: 40 30 30       HEP          Treatment(s)                 Marble pick up  Re-eval complete              Calc/STJ mobs x STM, gentle ROM              AA DF/PF with overpressure x               Sitting weight bearing ex x               Walking in //bars x                                                                            Rehabilitation Treatment Flowsheet    Precautions:    Date: 01/07/10 8/8 01/13/10 01/16/10 01/21/10 01/26/10 01/28/10   Initials: CJ CJ CJ CJ CJ CJ CJ   Visit #: 1 2 3 4 5 6 7    POC Due Date:           Time: 60 30 30 30  45  45   HEP yes         Treatment(s)                 IE complete E-stim x 10 minutes Tens unit x 20 minutes  x x [place] estim to ant tibilais ms. x          Pt education Sensory stimulation Sensory stimulation with different textures x x x x           marble pick up Marble pick up x x             Gentle weight bearing  Weight bearing against PB x Against PODs x              Gait training in // bars x x                STM to plantar foot

## 2010-02-16 ENCOUNTER — Ambulatory Visit (HOSPITAL_BASED_OUTPATIENT_CLINIC_OR_DEPARTMENT_OTHER): Payer: No Typology Code available for payment source

## 2010-02-16 DIAGNOSIS — S9030XA Contusion of unspecified foot, initial encounter: Secondary | ICD-10-CM

## 2010-02-16 NOTE — Patient Instructions (Addendum)
Rehabilitation Treatment Flowsheet    Precautions:    Date: 01/30/10 02/02/10 02/11/10 02/13/10      Initials: Clint Bolder CJ      Visit #: 8         POC Due Date:    03/15/10      Time: 40 30 30 45      HEP          Treatment(s)                 Marble pick up   x             Calc/STJ mobs x  x             AA DF/PF with overpressure x  x             Sitting weight bearing ex x  x             Walking in //bars x  x                MFR flexor retinaculum and ankle fascia                                                          Rehabilitation Treatment Flowsheet    Precautions:    Date: 01/07/10 8/8 01/13/10 01/16/10 01/21/10 01/26/10 01/28/10   Initials: CJ CJ CJ CJ CJ CJ CJ   Visit #: 1 2 3 4 5 6 7    POC Due Date:          Time: 60 30 30 30  45  45   HEP yes         Treatment(s)                 IE complete E-stim x 10 minutes Tens unit x 20 minutes  x x [place] estim to ant tibilais ms. x          Pt education Sensory stimulation Sensory stimulation with different textures x x x x           marble pick up Marble pick up x x             Gentle weight bearing  Weight bearing against PB x Against PODs x              Gait training in // bars x x                STM to plantar foot                                                                                       Rehabilitation Treatment Flowsheet    Precautions:    Date: 01/30/10 02/02/10 02/07/10 02/16/10      Initials: Clint Bolder CJ      Visit #: 8   11      POC Due Date: 02/07/10  Time: 40 30 30 45      HEP          Treatment(s)                 Marble pick up  Re-eval complete              Calc/STJ mobs x STM, gentle ROM x             AA DF/PF with overpressure x  x             Sitting weight bearing ex x  x             Walking in //bars x  x                Marble pick-up                HEP program for hip strength                                          Rehabilitation Treatment Flowsheet    Precautions:    Date: 01/07/10 8/8 01/13/10 01/16/10 01/21/10 01/26/10 01/28/10   Initials: CJ CJ CJ CJ  CJ CJ CJ   Visit #: 1 2 3 4 5 6 7    POC Due Date:          Time: 60 30 30 30  45  45   HEP yes         Treatment(s)                 IE complete E-stim x 10 minutes Tens unit x 20 minutes  x x [place] estim to ant tibilais ms. x          Pt education Sensory stimulation Sensory stimulation with different textures x x x x           marble pick up Marble pick up x x             Gentle weight bearing  Weight bearing against PB x Against PODs x              Gait training in // bars x x                STM to plantar foot

## 2010-02-16 NOTE — Progress Notes (Signed)
.    S: patient worked 8 hour day yesterday and her foot was visibly stiffened, tendons were taut and foot appeared atrophied. She also reports pain in B hips, likely due to her impaired gait mechanics. Will continue to manage and monitor  O: Refer to Rehabilitation Treatment Flowsheet  A: DF-PF 6-30  P: continue per POC

## 2010-02-18 ENCOUNTER — Ambulatory Visit (HOSPITAL_BASED_OUTPATIENT_CLINIC_OR_DEPARTMENT_OTHER): Payer: PRIVATE HEALTH INSURANCE | Admitting: Podiatrist

## 2010-02-18 ENCOUNTER — Ambulatory Visit (HOSPITAL_BASED_OUTPATIENT_CLINIC_OR_DEPARTMENT_OTHER): Payer: No Typology Code available for payment source

## 2010-02-18 DIAGNOSIS — S9030XA Contusion of unspecified foot, initial encounter: Secondary | ICD-10-CM

## 2010-02-18 DIAGNOSIS — S99921A Unspecified injury of right foot, initial encounter: Secondary | ICD-10-CM

## 2010-02-18 DIAGNOSIS — G9050 Complex regional pain syndrome I, unspecified: Secondary | ICD-10-CM

## 2010-02-18 DIAGNOSIS — M792 Neuralgia and neuritis, unspecified: Secondary | ICD-10-CM

## 2010-02-18 MED ORDER — IBUPROFEN 800 MG PO TABS
800.0000 mg | ORAL_TABLET | Freq: Three times a day (TID) | ORAL | Status: AC | PRN
Start: 2010-02-18 — End: 2010-05-01

## 2010-02-18 MED ORDER — TRAMADOL HCL 50 MG PO TABS
50.0000 mg | ORAL_TABLET | Freq: Four times a day (QID) | ORAL | Status: DC | PRN
Start: 2010-02-18 — End: 2010-05-19

## 2010-02-18 MED ORDER — PREGABALIN 75 MG PO CAPS
75.00 mg | ORAL_CAPSULE | Freq: Two times a day (BID) | ORAL | Status: AC
Start: 2010-02-18 — End: 2010-05-01

## 2010-02-18 NOTE — Patient Instructions (Signed)
Rehabilitation Treatment Flowsheet    Precautions:    Date: 01/30/10 02/02/10 02/07/10 02/16/10 02/18/10     Initials: Ludwig Lean CJ     Visit #: 8   11 12      POC Due Date: 02/07/10         Time: 40 30 30 45 30     HEP          Treatment(s)                 Marble pick up  Re-eval complete              Calc/STJ mobs x STM, gentle ROM x x            AA DF/PF with overpressure x  x x            Sitting weight bearing ex x  x x            Walking in //bars x  x                Marble pick-up                HEP program for hip strength x                FR hip and LE                         Rehabilitation Treatment Flowsheet    Precautions:    Date: 01/07/10 8/8 01/13/10 01/16/10 01/21/10 01/26/10 01/28/10   Initials: CJ CJ CJ CJ CJ CJ CJ   Visit #: 1 2 3 4 5 6 7    POC Due Date:          Time: 60 30 30 30  45  45   HEP yes         Treatment(s)                 IE complete E-stim x 10 minutes Tens unit x 20 minutes  x x [place] estim to ant tibilais ms. x          Pt education Sensory stimulation Sensory stimulation with different textures x x x x           marble pick up Marble pick up x x             Gentle weight bearing  Weight bearing against PB x Against PODs x              Gait training in // bars x x                STM to plantar foot

## 2010-02-18 NOTE — Progress Notes (Signed)
.    S: patient c/o hip and leg pain and numbness.  O: Refer to Rehabilitation Treatment Flowsheet  A: gave patient extensive FR exercises today  P: as indicated

## 2010-02-19 LAB — SURG SPEC CLINIC NOTE

## 2010-02-19 NOTE — Progress Notes (Signed)
This office note has been dictated. Account number [id number]

## 2010-02-20 ENCOUNTER — Ambulatory Visit (HOSPITAL_BASED_OUTPATIENT_CLINIC_OR_DEPARTMENT_OTHER): Payer: No Typology Code available for payment source

## 2010-02-23 ENCOUNTER — Ambulatory Visit (HOSPITAL_BASED_OUTPATIENT_CLINIC_OR_DEPARTMENT_OTHER): Payer: No Typology Code available for payment source

## 2010-02-25 ENCOUNTER — Ambulatory Visit (HOSPITAL_BASED_OUTPATIENT_CLINIC_OR_DEPARTMENT_OTHER): Payer: No Typology Code available for payment source

## 2010-02-25 ENCOUNTER — Ambulatory Visit (HOSPITAL_BASED_OUTPATIENT_CLINIC_OR_DEPARTMENT_OTHER): Payer: PRIVATE HEALTH INSURANCE | Admitting: Podiatrist

## 2010-02-25 DIAGNOSIS — S9030XA Contusion of unspecified foot, initial encounter: Secondary | ICD-10-CM

## 2010-02-25 DIAGNOSIS — M792 Neuralgia and neuritis, unspecified: Secondary | ICD-10-CM

## 2010-02-25 DIAGNOSIS — G90529 Complex regional pain syndrome I of unspecified lower limb: Secondary | ICD-10-CM

## 2010-02-25 NOTE — Progress Notes (Signed)
This is a 39 year old female who presents to the office for follow up of RSD. Patient has been seen in the office for serial posterior tibial nerve injections. Patient states that she is taking Lyrica 75mg  daily before going to bed to control the pain - she admits that she's tolerating this much better. Patient is also taking motrin and tramadol as needed. She also admits to walking as much as possible and states that overall her foot feels much improved.     ROS: unremarkable, no NVFCD, SOB    Allergies: PCN, Oxycodone, Erythromycin    PMH/Meds: no changes since the last visit.     PE: Gen A&Ox3, NAD  Pedal pulses palpable, hair growth present with CFT < 3 seconds. Nails and webspaces intact and within normal limits. No open wounds, no varicosities noted. No edema or erythema. Mild decrease in light touch sensation at the forefoot region with no pain upon palpation. No gross deformities noted otherwise.     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

## 2010-02-27 ENCOUNTER — Ambulatory Visit (HOSPITAL_BASED_OUTPATIENT_CLINIC_OR_DEPARTMENT_OTHER): Payer: No Typology Code available for payment source

## 2010-02-27 DIAGNOSIS — S9030XA Contusion of unspecified foot, initial encounter: Secondary | ICD-10-CM

## 2010-02-27 NOTE — Progress Notes (Signed)
Patient was seen with resident Dr. Oh.  I concur with their assessment and treatment plan.

## 2010-02-27 NOTE — Progress Notes (Signed)
.    S: patient received a nerve block from Dr Rosilyn Mings and reports significant relief of pain  O: Refer to Rehabilitation Treatment Flowsheet  A: continue with POC. Patient going for nerve conduction study on Monday  P: per POC

## 2010-02-27 NOTE — Patient Instructions (Addendum)
Rehabilitation Treatment Flowsheet    Precautions:    Date: 01/30/10 02/02/10 02/07/10 02/16/10 02/18/10 02/27/10    Initials: Irven Easterly CJ CJ CJ CJ CJ    Visit #: 8   11 12 13     POC Due Date: 02/07/10         Time: 40 30 30 45 30 30    HEP          Treatment(s)                 Marble pick up  Re-eval complete              Calc/STJ mobs x STM, gentle ROM x x            AA DF/PF with overpressure x  x x x           Sitting weight bearing ex x  x x Standing SLS on airex           Walking in //bars x  x  Walking on treadmill              Marble pick-up  Rope workout               HEP program for hip strength x Manual gastroc/ soleus stretch               FR hip and LE                         Rehabilitation Treatment Flowsheet    Precautions:    Date: 01/07/10 8/8 01/13/10 01/16/10 01/21/10 01/26/10 01/28/10   Initials: CJ CJ CJ CJ CJ CJ CJ   Visit #: 1 2 3 4 5 6 7    POC Due Date:          Time: 60 30 30 30  45  45   HEP yes         Treatment(s)                 IE complete E-stim x 10 minutes Tens unit x 20 minutes  x x [place] estim to ant tibilais ms. x          Pt education Sensory stimulation Sensory stimulation with different textures x x x x           marble pick up Marble pick up x x             Gentle weight bearing  Weight bearing against PB x Against PODs x              Gait training in // bars x x                STM to plantar foot                                                                                                           Rehabilitation Treatment Flowsheet    Precautions:    Date: 01/30/10 02/02/10 02/07/10 02/16/10 02/18/10  Initials: CJ CJ CJ CJ CJ     Visit #: 8   11 12      POC Due Date: 02/07/10         Time: 40 30 30 45 30     HEP          Treatment(s)                 Marble pick up  Re-eval complete              Calc/STJ mobs x STM, gentle ROM x x            AA DF/PF with overpressure x  x x            Sitting weight bearing ex x  x x            Walking in //bars x  x                Marble pick-up                 HEP program for hip strength x                FR hip and LE                         Rehabilitation Treatment Flowsheet    Precautions:    Date: 01/07/10 8/8 01/13/10 01/16/10 01/21/10 01/26/10 01/28/10   Initials: CJ CJ CJ CJ CJ CJ CJ   Visit #: 1 2 3 4 5 6 7    POC Due Date:          Time: 60 30 30 30  45  45   HEP yes         Treatment(s)                 IE complete E-stim x 10 minutes Tens unit x 20 minutes  x x [place] estim to ant tibilais ms. x          Pt education Sensory stimulation Sensory stimulation with different textures x x x x           marble pick up Marble pick up x x             Gentle weight bearing  Weight bearing against PB x Against PODs x              Gait training in // bars x x                STM to plantar foot

## 2010-03-02 ENCOUNTER — Ambulatory Visit (HOSPITAL_BASED_OUTPATIENT_CLINIC_OR_DEPARTMENT_OTHER): Payer: PRIVATE HEALTH INSURANCE | Admitting: Internal Medicine

## 2010-03-02 VITALS — BP 100/78 | HR 80

## 2010-03-02 DIAGNOSIS — S9030XA Contusion of unspecified foot, initial encounter: Secondary | ICD-10-CM

## 2010-03-02 MED ORDER — CAPSAICIN 0.025 % EX CREA
TOPICAL_CREAM | Freq: Two times a day (BID) | CUTANEOUS | Status: AC
Start: 2010-03-02 — End: 2010-05-02

## 2010-03-02 NOTE — Progress Notes (Signed)
Neurology Clinic Note    Patient Name: Madison Mckay  MRN:   [id number]  Date:  03/02/2010    Reason for Visit  Pain in the right foot and leg    Dear Dr. Michele Rockers,      Madison Mckay is a 39 year old right-handed woman with history of asthma, Raynaud's syndrome, and HTN who presents to neurology clinic today for pain in the right foot and leg.    The patient injured her right foot on December 14, 2009.  The patient was in the basement at work and heavy files fell on her foot.  She came to [facility] after this.  As the swelling came down, the pain in the patient's foot transformed from a pressure feeling to a sharp stabbing feeling.  This sharp pain radiates from the right foot up to the leg (medial side involving the lower and upper legs). The sharp pain is triggered by the patient rolling her foot onto the ball of her foot. The patient has had lower back pain since the injury to her foot (she thinks this is because she walks awkwardly now 2/2 the pain in her right foot).  The patient has had numbness/tingling in her right foot since the injury, this is present all the time.  The patient has been having the sharp pain in her foot/leg about 15-30 times a day.  There has been no sensation loss in the right leg.    There are no symptoms in the left leg.  The right foot gets red and swollen at times, the patient is unsure whether this happens before or after she has pain in the foot and leg, but the foot/leg get red when the foot/leg are painful.        ROS:  There has been no visual loss.  No reduction in hearing.  No headaches, neck pain, vertigo, weakness, numbness, difficulty with comprehension or speaking, difficulty with swallowing, or difficulty with balance or gait.  On general review of systems, there have been no fevers, chills, rashes, change in weight, energy level or appetite, chest pain, palpitations, shortness of breath, cough, abdominal pain, nausea, vomiting, or change in bowel/bladder  habits (i.e. incontinence).    PMHx  Asthma, HTN, Raynaud's syndrome (patient had white hands)    Medications  Motrin prn, ultram prn, lyrica 75 mg BID, duo-neb, synthroid, HCTZ, norvasc, atarax, advair, ventolin, vitamin D, vitamin B complex vitamins, omega 3, wellbutrin XL, lidoderm, flonase, MVI    Allergies/Adverse reactions  PCN, oxycodone, erythromycin    Family History  No family history of Raynaud's.  Grandmother with Rheumatoid arthritis.  No family history of lupus.  +family history of cardiovascular disease.    Social History  Lives in [place], [place].  Works in Administrator, sports.  Denies smoking.  Denies alcohol.    Physical Exam  General:    Appearance: NAD  Skin:  Right foot appears slightly more red compared to left.  HEENT:  No evidence of trauma to the head or neck, neck supple.    Mental status:  Gen:   Alert, appropriately interactive, normal affect.  Orientation:  Full.  Attention:  Names months backwards correctly.  Speech/Lang: Fluent w/o paraphasic errors; Follows simple and complex commands without  L/R confusion.     CN:  I:  Not tested.  II:  VFFC. PERRL 4 mm   2 mm. No RAPD.  III,IV,VI:  EOMI w/o nystagmus (or diplopia). No ptosis.  V:   Sensation intact to  LT.   VII:   Face symmetric without weakness.  VIII:  Hears finger rub equally and bilaterally.  IX,X:  Voice normal. Palate elevates symmetrically.  XI:  SCM and trapezii full.  XII:  Tongue protrudes midline.    Motor:  Normal bulk and tone; no tremor, rigidity, or bradykinesia. No pronator drift.   Give way weakness in the right ankle to dorsiflexion/plantarflexion/inversion and eversion.  5/5 right hip flexors and right leg extension/flexion.  5/5 left ankle dorsi/plantar flexion/inversion/eversion.  5/5 left hip flexors and left leg extension/flexion.    Coord:  Finger-to-nose-finger movements without dysmetria. No truncal ataxia.    Reflex:  2+ biceps, triceps, brachioradialis, patella bilaterally, 1+ ankle jerks bilaterally, toes  downgoing bilaterally.    Sens:   Diminished sensation to LT, temperature, and pinprick in the entire right foot, as well as medial right lower/upper leg.   Intact LT, temperature, and pinprick sensation in the left foot.  Normal vibratory sensation in the feet bilaterally.  Normal LT and temperature sensation in the hands bilaterally.      Gait:  Antalgic gait.  Romberg negative.    Assessment and Plan  Madison Mckay is a 39 year old right-handed woman with history of asthma, Raynaud's syndrome, and HTN who presents to neurology clinic today for pain in the right foot and leg.  The patient had an injury to her right foot (files and a shelf fell on her right foot) about 3 months ago.  Since this injury, the patient has had episodic pain in her right foot with sharp pain radiating into her right lower and upper leg.  She says the skin of her right foot often appears more red compared to the left and at times her right foot becomes swollen.      On exam, the patient has give-way weakness in the right foot at the ankle (dorsiflexion/plantarflexion/ankle inversion/eversion) secondary to pain.  There is also weakness of toe extension and flexion in the right foot, again, limited by pain.  The patient has diminished sensation to LT, temperature, and pinprick in the entire right foot, as well as medial right lower/upper leg.  Reflexes are normal throughout in the right and left legs.  The patient's right foot is slightly more red compared to the left.    Given the history and examination, this sounds like reflex sympathetic dystrophy (RSD).  RSD often evolves as has happened in this patient, usually after an injury to a limb.  RSD is thought to result from an injury, and then a automonic reflex arc.  This reflex arc is thought to be produced from the pain of an injury, and as a result, the patient's limb often is in sympathetic overdrive (limb can turn red, be painful, and be swollen, as we see in this patient).  Other  possibilities for this patient's pain include a lumbo-sacral radiculopathy.  The patient does have some back pain since injuring her foot 3 months ago, but the patient feels this is because she is now walking differently (because of the foot pain).  Her reflexes are normal today, arguing against a radiculopathy, and her sensory loss is not exactly in a dermatome (the sensory loss is almost in the L4 dermatome).  Given the lack of symptoms/signs in any of the other extremities, I think a peripheral neuropathy is unlikely. A central nervous system process (stroke, ICH) is unlikely given the lack of signs on exam.       I told the patient that  agents like lyrica (which the patient is already on) are best to treat the neuropathic-type pain.  I told her that if lyrica is unsuccessful, we could always try other neuropathic medications (e.g. neurontin or tegretol).  I will start capsaicin cream today for symptomatic relief.  I told the patient that if this is RSD, it will get better with time.  I told her that if she develops any new weakness or worsened back pain, that she should return to see me for further work-up.  I would hold on checking neuropathy labs at this time given there is no sensation loss in the other extremities, and given the patient's age and lack of comorbidities.      Plan:  -Sent capsaicin cream to patient's pharmacy  -If lyrica fails, you could try gabapentin, if gabapentin fails, could try tegretol  -No indication for neuroimaging at this time given non-focal exam and given story as above  -Patient may return to this clinic on a PRN basis    I have spent 60 minutes in face to face time with this patient/patient proxy of which > 50% was in counseling or coordination of care regarding right foot/leg pain.    Charlean Sanfilippo, MD  Neurology

## 2010-03-03 ENCOUNTER — Ambulatory Visit (HOSPITAL_BASED_OUTPATIENT_CLINIC_OR_DEPARTMENT_OTHER): Payer: No Typology Code available for payment source | Admitting: Podiatrist

## 2010-03-04 ENCOUNTER — Ambulatory Visit (HOSPITAL_BASED_OUTPATIENT_CLINIC_OR_DEPARTMENT_OTHER): Payer: PRIVATE HEALTH INSURANCE | Admitting: Podiatrist

## 2010-03-04 DIAGNOSIS — G9050 Complex regional pain syndrome I, unspecified: Secondary | ICD-10-CM

## 2010-03-04 DIAGNOSIS — M792 Neuralgia and neuritis, unspecified: Secondary | ICD-10-CM

## 2010-03-04 NOTE — Progress Notes (Signed)
This is a 39 year old female who presents to the office for follow up of RSD. Patient has been seen in the office for serial posterior tibial nerve injections. Patient states that she is taking Lyrica 75mg  daily before going to bed to control the pain - she admits that she's tolerating this much better. Patient is also taking motrin and tramadol as needed. She still complains of numbness of the medial leg and tingling of the plantar foot.    ROS: unremarkable, no NVFCD, SOB   Allergies: PCN, Oxycodone, Erythromycin   PMH/Meds: no changes since the last visit.   PE: Gen A&Ox3, NAD   Pedal pulses palpable, hair growth present with CFT < 3 seconds. Nails and webspaces intact and within normal limits. No open wounds, no varicosities noted. No edema or erythema. Mild decrease in light touch sensation at the forefoot region with no pain upon palpation. No gross deformities noted otherwise.   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

## 2010-03-08 NOTE — Progress Notes (Signed)
Patient was seen with resident Dr. Riordan.  I concur with their assessment and treatment plan.

## 2010-03-11 ENCOUNTER — Ambulatory Visit (HOSPITAL_BASED_OUTPATIENT_CLINIC_OR_DEPARTMENT_OTHER): Payer: No Typology Code available for payment source | Admitting: Podiatrist

## 2010-03-11 DIAGNOSIS — S9030XA Contusion of unspecified foot, initial encounter: Secondary | ICD-10-CM

## 2010-03-11 DIAGNOSIS — M792 Neuralgia and neuritis, unspecified: Secondary | ICD-10-CM

## 2010-03-11 DIAGNOSIS — G9050 Complex regional pain syndrome I, unspecified: Secondary | ICD-10-CM

## 2010-03-11 MED ORDER — ZOLPIDEM TARTRATE ER 12.5 MG PO TBCR
12.5000 mg | EXTENDED_RELEASE_TABLET | Freq: Every evening | ORAL | Status: DC | PRN
Start: 2010-03-11 — End: 2010-04-01

## 2010-03-11 NOTE — Progress Notes (Signed)
This is a 39 year old female who presents to the office for follow up of RSD. Patient has been seen in the office for serial posterior tibial nerve injections. Patient states that she is taking Lyrica 75mg  daily before going to bed to control the pain - she admits that she's tolerating this much better. Pt states that she is having longer periods of relief, but when she does get pain it is more unbearable. Pt would also like Ambien CR so that she can sleep longer.  She still complains of numbness of the medial leg and tingling of the plantar foot.    ROS: unremarkable, no NVFCD, SOB   Allergies: PCN, Oxycodone, Erythromycin   PMH/Meds: no changes since the last visit.   PE: Gen A&Ox3, NAD   Pedal pulses palpable, hair growth present with CFT < 3 seconds. Nails and webspaces intact and within normal limits. No open wounds, no varicosities noted. No edema or erythema. Mild decrease in light touch sensation at the forefoot region with tenderness upon palpation plantarly. No gross deformities noted otherwise.   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

## 2010-03-12 NOTE — Progress Notes (Addendum)
Addended byRosilyn Mings, Loghan Kurtzman on: 03/12/2010      Modules accepted: Orders

## 2010-03-18 ENCOUNTER — Ambulatory Visit (HOSPITAL_BASED_OUTPATIENT_CLINIC_OR_DEPARTMENT_OTHER): Payer: PRIVATE HEALTH INSURANCE | Admitting: Podiatrist

## 2010-03-18 DIAGNOSIS — M792 Neuralgia and neuritis, unspecified: Secondary | ICD-10-CM

## 2010-03-18 DIAGNOSIS — G9050 Complex regional pain syndrome I, unspecified: Secondary | ICD-10-CM

## 2010-03-18 DIAGNOSIS — S9030XA Contusion of unspecified foot, initial encounter: Secondary | ICD-10-CM

## 2010-03-18 NOTE — Progress Notes (Signed)
This is a 39 year old female returning to clinic for follow of up RIGHT extremity Reflex Sympathetic Dystrophy secondary to traumatic episode involving a shelf falling on her foot in July. Patient's last visit was 03/11/10 at which point a PT block was performed. Patient was also provided an Rx for Ambien. Patient has noticed increased symptomatic relief post-PT injections. She has been able to sleep better with Ambien. She reports no adverse events associated with Ambien or the PT-block. Patient still complains of tingling, burning, shooting pain in the ball of her foot and pulp of the hallux. She notes improvement since the initial presentation in July of 2011 at the onset of injury (a shelf fell on her foot -- no subsequent fracture or dislocations - both radiographs and MRI unremarkable).    [id number] fever, nausea, vomiting, chills, malaise, shortness of breath, chest pain.      Past Medical History    Hypertension     Hypothyroidism     Anxiety     Depression     Comment: no manic, no suicide attempts, on wellbutrin for years    Asthma     Comment: triggers = smoke, cold air, hospitalized several times, never intubated (refused once)     Endometriosis     Comment: resolved since TAH    Rosacea        Review of Patient's Allergies indicates:   Penicillins             Hives   Oxycodone                   Comment:Hives, other narcotics OK   Erythromycin            Nausea Only    Comment:Stomach pain severe no nausea, can take             azithromycin        Past Surgical History    TAH +-RMVL TUBE +-RMVL OVARY     Comment just TAH no ovary removal, 2005; laparascopies x 2 before TAH for endometriosis    ACL REPAIR     Comment left 2004, cadavaric acl    OB ANTEPARTUM CARE C DLVR&POSTPARTUM     Comment x2, 97 and 98    ROTATOR CUFF REPAIR     Comment Labral repair anchor not sure if plastic or metal but had MRI with this in place without problems, on right shoulder       LE PE: DP/PT 2/4 B/L;  Capillary Refill Time < 3 seconds x 10 toes; Right foot is more erythematous and colder compared to the left. There are no varicosities, no telangiectasias. No lymphangitis, no lymphangitic streaking. Neuro: Achilles DTR 0/5 Right, 2/5 Left. Vibratory sensation is absent from the medial malleolus up to the tibial tuberosity on the right but present on the left. Light touch sensation is absent right foot, present left. No tinel's sign present at flexor canal B/L. Babinski sign is absent B/L. Muscle Power: 2/5 Right and 5/5 Left for everters, inverters, plantarflexors, dorsiflexors. There are no open lesions or signs of infection present B/L.    A/P: Right Reflex Sympathetic Dystrophy    1. Patient evaluated and chart reviewed  2. PT-block with 10-cc total of 0.5% Marcaine and 0.5-cc of Decadron 4 mg/mL in to the right foot. She tolerated the procedure well with no complications.  3. Continue Lyrica and Ambien.  4. Patient to return to clinic in 2 weeks for follow up

## 2010-03-18 NOTE — Progress Notes (Signed)
Patient was seen with resident Dr. Riordan.  I concur with their assessment and treatment plan.

## 2010-03-19 ENCOUNTER — Ambulatory Visit (HOSPITAL_BASED_OUTPATIENT_CLINIC_OR_DEPARTMENT_OTHER): Payer: No Typology Code available for payment source | Admitting: Podiatrist

## 2010-03-21 DIAGNOSIS — G9050 Complex regional pain syndrome I, unspecified: Secondary | ICD-10-CM | POA: Insufficient documentation

## 2010-03-21 NOTE — Progress Notes (Signed)
Patient was seen with PMS4 Nodelman.  I concur with their assessment and treatment plan.

## 2010-03-25 NOTE — Progress Notes (Addendum)
Addended byRosilyn Mings, Khalilah Hoke on: 03/25/2010      Modules accepted: Orders

## 2010-03-30 ENCOUNTER — Telehealth (HOSPITAL_BASED_OUTPATIENT_CLINIC_OR_DEPARTMENT_OTHER): Payer: Self-pay

## 2010-03-30 NOTE — Telephone Encounter (Signed)
C\O tight breathing with cough, "knows it's my asthma, taking all medications ordered."  No apt available today with Dr. Homero Fellers, needs to come after work after 4 pm.  Requesting apt tomorrow, scheduled with Dr. Delories Heinz after pt's work.  Stressed the severity asthma attacks can cause, advised to go straight to ED if symptoms worsen.Madison Mckay

## 2010-04-01 ENCOUNTER — Ambulatory Visit (HOSPITAL_BASED_OUTPATIENT_CLINIC_OR_DEPARTMENT_OTHER): Payer: No Typology Code available for payment source | Admitting: Podiatrist

## 2010-04-01 DIAGNOSIS — G9050 Complex regional pain syndrome I, unspecified: Secondary | ICD-10-CM

## 2010-04-01 DIAGNOSIS — I10 Essential (primary) hypertension: Secondary | ICD-10-CM

## 2010-04-01 MED ORDER — ZOLPIDEM TARTRATE ER 12.5 MG PO TBCR
12.5000 mg | EXTENDED_RELEASE_TABLET | Freq: Every evening | ORAL | Status: DC | PRN
Start: 2010-04-01 — End: 2010-05-19

## 2010-04-01 MED ORDER — HYDROCHLOROTHIAZIDE 25 MG PO TABS
25.0000 mg | ORAL_TABLET | Freq: Every day | ORAL | Status: DC
Start: 2010-04-01 — End: 2010-07-06

## 2010-04-01 NOTE — Progress Notes (Signed)
This is a 39 year old female returning to clinic for follow of up RIGHT extremity Reflex Sympathetic Dystrophy secondary to traumatic episode involving a shelf falling on her foot in July. Patient's last visit was 2 weeks ago for which she received an PT injection. She is getting relief after the injection and states that the amount of time before she the pain sets in again has been increasing. She reports no adverse events associated with Ambien or the PT-block.  She notes improvement since the initial presentation in July of 2011 at the onset of injury.    ZOX:[id number] fever, nausea, vomiting, chills, malaise, shortness of breath, chest pain.        LE PE: DP/PT 2/4 B/L; Capillary Refill Time < 3 seconds x 10 toes; Right foot is more erythematous and colder compared to the left. There are no varicosities, no telangiectasias. No lymphangitis, no lymphangitic streaking. Neuro: Achilles DTR 0/5 Right, 2/5 Left. Vibratory sensation is absent from the medial malleolus up to the tibial tuberosity on the right but present on the left. Light touch sensation is absent right foot, present left. No tinel's sign present at flexor canal B/L. Babinski sign is absent B/L. Muscle Power: 2/5 Right and 5/5 Left for everters, inverters, plantarflexors, dorsiflexors. There are no open lesions or signs of infection present B/L.    A/P: Right Reflex Sympathetic Dystrophy    1. Patient evaluated and chart reviewed  2. PT-block with 10-cc total of 0.5% Marcaine and 0.5-cc of Decadron 4 mg/mL in to the right foot. She tolerated the procedure well with no complications.  3. Continue Lyrica and Ambien; rx for ambien.  4. Patient to return to clinic in 2 weeks for follow up

## 2010-04-06 NOTE — Progress Notes (Signed)
Patient was seen with resident Dr. Castro.  I concur with their assessment and treatment plan.

## 2010-04-13 ENCOUNTER — Encounter (HOSPITAL_BASED_OUTPATIENT_CLINIC_OR_DEPARTMENT_OTHER): Payer: Self-pay | Admitting: Internal Medicine

## 2010-04-15 ENCOUNTER — Ambulatory Visit (HOSPITAL_BASED_OUTPATIENT_CLINIC_OR_DEPARTMENT_OTHER): Payer: PRIVATE HEALTH INSURANCE | Admitting: Podiatrist

## 2010-04-15 DIAGNOSIS — S9030XA Contusion of unspecified foot, initial encounter: Secondary | ICD-10-CM

## 2010-04-15 DIAGNOSIS — G9050 Complex regional pain syndrome I, unspecified: Secondary | ICD-10-CM

## 2010-04-15 DIAGNOSIS — M792 Neuralgia and neuritis, unspecified: Secondary | ICD-10-CM

## 2010-04-15 NOTE — Progress Notes (Signed)
This office note has been dictated. Account number [id number]

## 2010-04-28 ENCOUNTER — Ambulatory Visit (HOSPITAL_BASED_OUTPATIENT_CLINIC_OR_DEPARTMENT_OTHER): Payer: PRIVATE HEALTH INSURANCE | Admitting: Podiatrist

## 2010-04-28 DIAGNOSIS — M792 Neuralgia and neuritis, unspecified: Secondary | ICD-10-CM

## 2010-04-28 DIAGNOSIS — G9050 Complex regional pain syndrome I, unspecified: Secondary | ICD-10-CM

## 2010-04-28 DIAGNOSIS — M25579 Pain in unspecified ankle and joints of unspecified foot: Secondary | ICD-10-CM

## 2010-04-28 DIAGNOSIS — M722 Plantar fascial fibromatosis: Secondary | ICD-10-CM

## 2010-04-28 NOTE — Progress Notes (Signed)
This office note has been dictated. Account number [id number]

## 2010-04-29 LAB — SURG SPEC CLINIC NOTE

## 2010-05-09 ENCOUNTER — Other Ambulatory Visit (HOSPITAL_BASED_OUTPATIENT_CLINIC_OR_DEPARTMENT_OTHER): Payer: Self-pay | Admitting: Internal Medicine

## 2010-05-11 NOTE — Telephone Encounter (Signed)
Madison Mckay is a 39 year old female has requested a refill of hydroxyzine 10 mg tablet.  Other Med Adult:  Most Recent BP Reading(s)  03/02/10 : 100/78        No results found for this basename: cholesterol:1    No results found for this basename: LDL:1    No results found for this basename: HDL:1    No results found for this basename: tg:1        No results found for this basename: TSHSC:1        THYROID STIM HORMONE (uIU/mL)   Date     Date  Value    06/17/2009  0.13*   ----------      No results found for this basename: hgba1c:1        No results found for this basename: INR:3       Documented [facility] [id number] [place] [phone] Fax: [phone]

## 2010-05-19 ENCOUNTER — Ambulatory Visit (HOSPITAL_BASED_OUTPATIENT_CLINIC_OR_DEPARTMENT_OTHER): Payer: PRIVATE HEALTH INSURANCE | Admitting: Podiatrist

## 2010-05-19 DIAGNOSIS — M7740 Metatarsalgia, unspecified foot: Secondary | ICD-10-CM

## 2010-05-19 DIAGNOSIS — S9030XA Contusion of unspecified foot, initial encounter: Secondary | ICD-10-CM

## 2010-05-19 DIAGNOSIS — G9050 Complex regional pain syndrome I, unspecified: Secondary | ICD-10-CM

## 2010-05-19 DIAGNOSIS — S99921A Unspecified injury of right foot, initial encounter: Secondary | ICD-10-CM

## 2010-05-19 MED ORDER — TRAMADOL HCL 50 MG PO TABS
50.0000 mg | ORAL_TABLET | Freq: Four times a day (QID) | ORAL | Status: AC | PRN
Start: 2010-05-19 — End: 2010-06-19

## 2010-05-19 MED ORDER — ZOLPIDEM TARTRATE ER 12.5 MG PO TBCR
12.5000 mg | EXTENDED_RELEASE_TABLET | Freq: Every evening | ORAL | Status: DC | PRN
Start: 2010-05-19 — End: 2010-05-19

## 2010-05-19 MED ORDER — ZOLPIDEM TARTRATE ER 12.5 MG PO TBCR
12.5000 mg | EXTENDED_RELEASE_TABLET | Freq: Every evening | ORAL | Status: AC | PRN
Start: 2010-05-19 — End: 2010-06-18

## 2010-05-22 LAB — SURG SPEC CLINIC NOTE

## 2010-05-22 NOTE — Progress Notes (Signed)
This office note has been dictated. Account number [id number]

## 2010-06-03 ENCOUNTER — Other Ambulatory Visit (HOSPITAL_BASED_OUTPATIENT_CLINIC_OR_DEPARTMENT_OTHER): Payer: Self-pay | Admitting: Internal Medicine

## 2010-06-03 NOTE — Telephone Encounter (Signed)
Message copied by Wolfgang Phoenix on Wed Jun 03, 2010  4:02 PM  ------       Message from: Ihor Dow       Created: Wed Jun 03, 2010 11:47 AM       Regarding: refill         [facility]              Person calling on behalf of patient: Pharmacist              May list multiple medications in this section       Medicine Name: hydrochlorothiazide (HYDRODIURIL)        Dosage: 25 MG tablet        Frequency (how many pills, how many times a day): Take 1 tablet by mouth daily              Documented patient preferred pharmacies:       [facility] [id number] [place]              Phone: [phone] Fax: [phone]                     Patient's language of care: English              Patient does not need an interpreter.

## 2010-06-03 NOTE — Telephone Encounter (Signed)
Madison Mckay is a 39 year old female has requested a refill of Hydrochlorothiazide.  HTN Med:    Most Recent BP Reading(s)  03/02/10 : 100/78  12/23/09 : 121/91  12/16/09 : 130/96    Documented patient preferred pharmacies:  [facility] [id number] [place] [phone] Fax: [phone]

## 2010-06-03 NOTE — Telephone Encounter (Signed)
Madison Mckay is a 39 year old female has requested a refill of Hydrochlorothiazide.  HTN Med:    Most Recent BP Reading(s)  03/02/10 : 100/78  12/23/09 : 121/91  12/16/09 : 130/96    Documented patient preferred pharmacies:  [facility] [id number] [place]: [phone] Fax: [phone]

## 2010-06-05 NOTE — Telephone Encounter (Signed)
I called pharmacy and left Rx on machine for HCTZ 25mg  take one tablet once daily dispense 30 refill 1. I will ask staff to assist me in getting this patient in for a visit - it is time for her yearly exam.

## 2010-06-21 ENCOUNTER — Other Ambulatory Visit (HOSPITAL_BASED_OUTPATIENT_CLINIC_OR_DEPARTMENT_OTHER): Payer: Self-pay | Admitting: Internal Medicine

## 2010-06-23 NOTE — Telephone Encounter (Signed)
Madison Mckay is a 40 year old female has requested a refill of:     - SYNTHROID 125 MCG tablet     Thyroid Med:  TSH:  THYROID STIM HORMONE (uIU/mL)   Date     Date  Value    06/17/2009  0.13*   ---------- TSH Screen:  No results found for this basename: TSHSC:1    Documented patient preferred pharmacies:  [facility] [id number] [place]: [phone] Fax: [phone]

## 2010-07-06 ENCOUNTER — Ambulatory Visit (HOSPITAL_BASED_OUTPATIENT_CLINIC_OR_DEPARTMENT_OTHER): Payer: 59 | Admitting: Internal Medicine

## 2010-07-06 ENCOUNTER — Ambulatory Visit (HOSPITAL_BASED_OUTPATIENT_CLINIC_OR_DEPARTMENT_OTHER): Payer: PRIVATE HEALTH INSURANCE | Admitting: Podiatrist

## 2010-07-06 VITALS — BP 120/90 | HR 88 | Temp 99.6°F | Resp 16 | Ht 64.76 in | Wt 144.0 lb

## 2010-07-06 DIAGNOSIS — E039 Hypothyroidism, unspecified: Secondary | ICD-10-CM

## 2010-07-06 DIAGNOSIS — S9030XA Contusion of unspecified foot, initial encounter: Secondary | ICD-10-CM

## 2010-07-06 DIAGNOSIS — I10 Essential (primary) hypertension: Secondary | ICD-10-CM

## 2010-07-06 DIAGNOSIS — L709 Acne, unspecified: Secondary | ICD-10-CM

## 2010-07-06 DIAGNOSIS — M25511 Pain in right shoulder: Secondary | ICD-10-CM | POA: Insufficient documentation

## 2010-07-06 DIAGNOSIS — Z0000 Encounter for general adult medical examination without abnormal findings: Secondary | ICD-10-CM

## 2010-07-06 DIAGNOSIS — M7740 Metatarsalgia, unspecified foot: Secondary | ICD-10-CM

## 2010-07-06 DIAGNOSIS — M79673 Pain in unspecified foot: Secondary | ICD-10-CM

## 2010-07-06 DIAGNOSIS — J45909 Unspecified asthma, uncomplicated: Secondary | ICD-10-CM

## 2010-07-06 DIAGNOSIS — S43439A Superior glenoid labrum lesion of unspecified shoulder, initial encounter: Secondary | ICD-10-CM

## 2010-07-06 DIAGNOSIS — L989 Disorder of the skin and subcutaneous tissue, unspecified: Secondary | ICD-10-CM

## 2010-07-06 MED ORDER — VENTOLIN HFA 108 (90 BASE) MCG/ACT IN AERS
2.0000 | INHALATION_SPRAY | RESPIRATORY_TRACT | Status: DC | PRN
Start: 2010-07-06 — End: 2010-10-30

## 2010-07-06 MED ORDER — AMLODIPINE BESYLATE 5 MG PO TABS
5.0000 mg | ORAL_TABLET | Freq: Every day | ORAL | Status: DC
Start: 2010-07-06 — End: 2010-12-22

## 2010-07-06 MED ORDER — HYDROCHLOROTHIAZIDE 25 MG PO TABS
25.0000 mg | ORAL_TABLET | Freq: Every day | ORAL | Status: DC
Start: 2010-07-06 — End: 2010-12-22

## 2010-07-06 MED ORDER — EPINEPHRINE 0.3 MG/0.3ML IJ DEVI
0.3000 mg | Freq: Once | INTRAMUSCULAR | Status: DC | PRN
Start: 2010-07-06 — End: 2011-07-21

## 2010-07-06 MED ORDER — BENZONATATE 100 MG PO CAPS
100.0000 mg | ORAL_CAPSULE | Freq: Three times a day (TID) | ORAL | Status: DC | PRN
Start: 2010-07-06 — End: 2010-10-30

## 2010-07-06 MED ORDER — SYNTHROID 125 MCG PO TABS
125.0000 ug | ORAL_TABLET | Freq: Every morning | ORAL | Status: DC
Start: 2010-07-06 — End: 2011-07-20

## 2010-07-06 MED ORDER — FLUTICASONE-SALMETEROL 500-50 MCG/DOSE IN AEPB
1.0000 | INHALATION_SPRAY | Freq: Two times a day (BID) | RESPIRATORY_TRACT | Status: DC
Start: 2010-07-06 — End: 2010-10-30

## 2010-07-06 MED ORDER — ALBUTEROL SULFATE (2.5 MG/3ML) 0.083% IN NEBU
2.5000 mg | INHALATION_SOLUTION | Freq: Four times a day (QID) | RESPIRATORY_TRACT | Status: DC | PRN
Start: 2010-07-06 — End: 2010-10-30

## 2010-07-06 MED ORDER — HYDROXYZINE HCL 10 MG PO TABS
10.0000 mg | ORAL_TABLET | ORAL | Status: DC | PRN
Start: 2010-07-06 — End: 2010-10-30

## 2010-07-06 MED ORDER — BUPROPION HCL ER (XL) 300 MG PO TB24
300.0000 mg | ORAL_TABLET | Freq: Every day | ORAL | Status: DC
Start: 2010-07-06 — End: 2010-12-22

## 2010-07-06 MED ORDER — LIDOCAINE 5 % EX PTCH
1.0000 | MEDICATED_PATCH | CUTANEOUS | Status: DC
Start: 2010-07-06 — End: 2011-06-10

## 2010-07-06 MED ORDER — CLINDAMYCIN PHOS-BENZOYL PEROX 1-5 % EX GEL
Freq: Two times a day (BID) | CUTANEOUS | Status: AC
Start: 2010-07-06 — End: 2012-10-23
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
## 2010-07-08 LAB — VITAMIN D,25 HYDROXY: VITAMIN D,25 HYDROXY: 26.8 ng/ml — ABNORMAL LOW (ref 30.0–100.0)

## 2010-07-08 LAB — TSH (THYROID STIMULATING HORMONE): TSH (THYROID STIM HORMONE): 0.46 u[IU]/mL (ref 0.34–5.60)

## 2010-07-14 NOTE — Progress Notes (Signed)
This office note has been dictated. Account number [id number]

## 2010-07-29 LAB — SURG SPEC CLINIC NOTE

## 2010-07-30 ENCOUNTER — Other Ambulatory Visit (HOSPITAL_BASED_OUTPATIENT_CLINIC_OR_DEPARTMENT_OTHER): Payer: Self-pay

## 2010-07-30 NOTE — Telephone Encounter (Signed)
Pharmacy called for refill on tramadol.  Patient was recently seen for foot pain but was not given any refill.  She was last given an rx by Dr. Rosilyn Mings for tramadol 50mg  in December.  The refill was authorized.

## 2010-08-05 ENCOUNTER — Ambulatory Visit (HOSPITAL_BASED_OUTPATIENT_CLINIC_OR_DEPARTMENT_OTHER): Payer: PRIVATE HEALTH INSURANCE | Admitting: Podiatrist

## 2010-08-05 DIAGNOSIS — M792 Neuralgia and neuritis, unspecified: Secondary | ICD-10-CM

## 2010-08-05 DIAGNOSIS — G9050 Complex regional pain syndrome I, unspecified: Secondary | ICD-10-CM

## 2010-08-05 MED ORDER — DIAZEPAM 5 MG PO TABS
5.0000 mg | ORAL_TABLET | Freq: Three times a day (TID) | ORAL | Status: DC | PRN
Start: 2010-08-05 — End: 2010-09-02

## 2010-08-05 MED ORDER — ZOLPIDEM TARTRATE ER 12.5 MG PO TBCR
12.5000 mg | EXTENDED_RELEASE_TABLET | Freq: Every evening | ORAL | Status: DC | PRN
Start: 2010-08-05 — End: 2010-09-02

## 2010-08-06 NOTE — Progress Notes (Signed)
This office note has been dictated. Account number [id number]

## 2010-08-11 LAB — SURG SPEC CLINIC NOTE

## 2010-08-19 ENCOUNTER — Ambulatory Visit (HOSPITAL_BASED_OUTPATIENT_CLINIC_OR_DEPARTMENT_OTHER): Payer: 59 | Admitting: Podiatrist

## 2010-08-19 DIAGNOSIS — G9050 Complex regional pain syndrome I, unspecified: Secondary | ICD-10-CM

## 2010-08-19 MED ORDER — AZITHROMYCIN 250 MG PO TABS
250.00 mg | ORAL_TABLET | Freq: Every day | ORAL | Status: AC
Start: 2010-08-19 — End: 2010-08-24

## 2010-08-29 NOTE — Progress Notes (Signed)
This office note has been dictated. Account number [id number]

## 2010-08-31 LAB — SURG SPEC CLINIC NOTE

## 2010-09-02 ENCOUNTER — Ambulatory Visit (HOSPITAL_BASED_OUTPATIENT_CLINIC_OR_DEPARTMENT_OTHER): Payer: 59 | Admitting: Podiatrist

## 2010-09-02 DIAGNOSIS — M79673 Pain in unspecified foot: Secondary | ICD-10-CM

## 2010-09-02 DIAGNOSIS — G9050 Complex regional pain syndrome I, unspecified: Secondary | ICD-10-CM

## 2010-09-02 DIAGNOSIS — M7740 Metatarsalgia, unspecified foot: Secondary | ICD-10-CM

## 2010-09-02 LAB — XR FOOT RIGHT MINIMUM 3 VIEWS

## 2010-09-02 MED ORDER — DIAZEPAM 5 MG PO TABS
5.0000 mg | ORAL_TABLET | Freq: Three times a day (TID) | ORAL | Status: AC | PRN
Start: 2010-09-02 — End: 2010-10-02

## 2010-09-02 MED ORDER — OXYCODONE-ACETAMINOPHEN 5-325 MG PO TABS
1.0000 | ORAL_TABLET | Freq: Three times a day (TID) | ORAL | Status: DC | PRN
Start: 2010-09-02 — End: 2010-09-17

## 2010-09-02 MED ORDER — ZOLPIDEM TARTRATE ER 12.5 MG PO TBCR
12.5000 mg | EXTENDED_RELEASE_TABLET | Freq: Every evening | ORAL | Status: DC | PRN
Start: 2010-09-02 — End: 2010-10-07

## 2010-09-02 NOTE — Progress Notes (Signed)
Patient was seen with resident Dr. Ben-Ad.  I concur with their assessment and treatment plan.

## 2010-09-02 NOTE — Progress Notes (Signed)
40 year old female presents for follow up of RSD, right foot. Patient states that the injection given at last visit relieved her pain for approximately 24/48 hours. She states that last week she went on a bike ride and was only able to ride for about 3 miles until having to stop due to the pain. She states pain is on the bottom of her foot and radiates up the medial side of her ankle up to the level of her knee. Patient states she is currently in significant pain, as she also worked for 55 hours on her feet last week. She wears her TENS unit for 6 hours a day and takes diazepam as needed, but states she only has minimal relief with this. Patient states she has become very discouraged as she is unable to perform her desired activities/ work outs.   ROS: Denies fevers, chills. Foot pain per above HPI.     PMH, Meds, Allergies reviewed on epic. Patient denies any changes since last visit.     PE:  - Gen: A&Ox3, NAD  - RLE: DP/PT pulses palpable. CFT WNL. Digits are cool. Sensation is grossly intact. She has exquisite tenderness at the 2nd, 3rd, and 4th metatarsal interspaces.    A/P: RSD, right foot  - Discussed further plan with the patient. At this point, it seems reasonable to proceed with an MRI to see if there is anything else that may be causing her pain. Patient was given another PT injection consisting of 3cc of 0.5% Marcaine, 1% Lidocaine pl, and 1cc of Dexamethasone. Refill for diazepam given as well as rx for ambien and percocet. She will follow up after her MRI.

## 2010-09-16 ENCOUNTER — Encounter (HOSPITAL_BASED_OUTPATIENT_CLINIC_OR_DEPARTMENT_OTHER): Payer: Self-pay | Admitting: Podiatrist

## 2010-09-17 ENCOUNTER — Other Ambulatory Visit (HOSPITAL_BASED_OUTPATIENT_CLINIC_OR_DEPARTMENT_OTHER): Payer: Self-pay | Admitting: Podiatrist

## 2010-09-17 DIAGNOSIS — M792 Neuralgia and neuritis, unspecified: Secondary | ICD-10-CM

## 2010-09-17 MED ORDER — OXYCODONE-ACETAMINOPHEN 5-325 MG PO TABS
1.0000 | ORAL_TABLET | Freq: Three times a day (TID) | ORAL | Status: DC | PRN
Start: 2010-09-17 — End: 2010-12-29

## 2010-09-23 ENCOUNTER — Ambulatory Visit (HOSPITAL_BASED_OUTPATIENT_CLINIC_OR_DEPARTMENT_OTHER): Payer: 59 | Admitting: Podiatrist

## 2010-09-23 DIAGNOSIS — G5760 Lesion of plantar nerve, unspecified lower limb: Secondary | ICD-10-CM

## 2010-09-23 DIAGNOSIS — M792 Neuralgia and neuritis, unspecified: Secondary | ICD-10-CM

## 2010-09-23 DIAGNOSIS — S9030XA Contusion of unspecified foot, initial encounter: Secondary | ICD-10-CM

## 2010-09-23 DIAGNOSIS — G9050 Complex regional pain syndrome I, unspecified: Secondary | ICD-10-CM

## 2010-09-23 NOTE — Progress Notes (Signed)
S:  40 year old female presents for follow up of RSD, right foot. Patient states that the injection given at last visit relieved her pain for approximately 48 hours. She reports the pain continues to be most severe at the 3rd and 4th interspaces of her R foot, most notably on the plantar aspect.  She continues to wear her TENS unit for at least 4+ hours daily.  She also reports taking Percocet and ambien to help with sleep, yet reports only mild relief.  She takes diazepam as needed with minimal relief.  She has scheduled an MRI on 4/24.  She has no other complaints at this time.     ROS: Denies fevers, chills. Foot pain per above HPI.     PMH, Meds, Allergies reviewed on epic. Patient denies any changes since last visit.     PE:   - Gen: A&Ox3, NAD   - RLE: DP/PT pulses palpable. CFT WNL. Digits are cool. Sensation is grossly intact. She has exquisite tenderness at the 2nd, 3rd, and 4th metatarsal interspaces, R foot.    A/P: RSD, right foot   - Discussed etiology of condition and plan with the patient.   - Performed an injection into interspaces 3 and 4 consisting of 0.5 cc of 0.5% marcaine, 0.5 cc of 1% lidocaine, 0.5 cc dexamethasone, 0.5 cc kenalog 10.  Pt tolerated the injection without complication.  - Pt to complete MRI on 4/24.  - Cont at home prn medications to assist with pain  - Pt will RTC in 2 weeks to evaluate MRI results

## 2010-10-01 NOTE — Progress Notes (Signed)
Patient was seen with resident Dr. Cornell Barman.  I concur with their assessment and treatment plan.    Pt  Symptoms seem to be most consistent with a neuroma today.    An injection consisting of 1/2 cc of dexamethasone (4mg /ml), 1/2 cc of Kenalog 10, and 1 cc of local anesthetic without epinephrine was administered to the right foot, third interspace.  It should be noted that prior to administration of the injection, I discussed the potential risks and complications associated with steroid injections, including transient elevation in blood glucose, change in pigmentation, fat pad atrophy, steroid flare, and pain.  Patient indicates that they understand and would like to proceed.    Physical Exam:  Pt has palpable dorsalis pedis and posterior tibial pulses.  Sensorium is grossly intact.  Pt has 5/5 muscle strength bilaterally, for lower extremity based on manual muscle testing of DF/PF, Inversion/Eversion, External/Internal rotation, and contraction/extension of feet.  Pt is (-) for calf pain with direct compression.  Pt denies f/c/n/v/sob. Pt is awake, alert and oriented, and is able to communicate their concerns and appears to understand my responses.    Pt is in no acute distress, and does not report any acute trauma.    PMH is discussed, and is unchanged since their last doctor's appointment.

## 2010-10-02 ENCOUNTER — Ambulatory Visit (HOSPITAL_BASED_OUTPATIENT_CLINIC_OR_DEPARTMENT_OTHER): Payer: Self-pay | Admitting: Podiatrist

## 2010-10-02 LAB — MRI FOOT RIGHT WO CONTRAST

## 2010-10-06 ENCOUNTER — Telehealth (HOSPITAL_BASED_OUTPATIENT_CLINIC_OR_DEPARTMENT_OTHER): Payer: Self-pay

## 2010-10-06 NOTE — Telephone Encounter (Signed)
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  She states that she is having extreme foot pain and has no pain medication remaining.  She is scheduled to see Dr. Rosilyn Mings tomorrow in clinic, yet requires medication during the interim.  She was called in to another pharmacist and Rx Oxycodone 5 mg #5 tablets.  The patient will pick up the hard copy of the Rx tomorrow in clinic to deliver to the pharmacist.

## 2010-10-07 ENCOUNTER — Ambulatory Visit (HOSPITAL_BASED_OUTPATIENT_CLINIC_OR_DEPARTMENT_OTHER): Payer: 59 | Admitting: Podiatrist

## 2010-10-07 DIAGNOSIS — M792 Neuralgia and neuritis, unspecified: Secondary | ICD-10-CM

## 2010-10-07 DIAGNOSIS — M79673 Pain in unspecified foot: Secondary | ICD-10-CM

## 2010-10-07 DIAGNOSIS — G9050 Complex regional pain syndrome I, unspecified: Secondary | ICD-10-CM

## 2010-10-07 MED ORDER — PREDNISONE 10 MG PO TABS
20.00 mg | ORAL_TABLET | ORAL | Status: AC
Start: 2010-10-07 — End: 2010-10-22

## 2010-10-07 MED ORDER — PREDNISONE 10 MG PO TABS
20.0000 mg | ORAL_TABLET | ORAL | Status: DC
Start: 2010-10-07 — End: 2010-10-07

## 2010-10-07 MED ORDER — DIAZEPAM 5 MG PO TABS
5.0000 mg | ORAL_TABLET | Freq: Two times a day (BID) | ORAL | Status: DC | PRN
Start: 2010-10-07 — End: 2010-11-04

## 2010-10-07 MED ORDER — OXYCODONE HCL 5 MG PO TABS
5.00 mg | ORAL_TABLET | ORAL | Status: AC | PRN
Start: 2010-10-07 — End: 2010-10-21

## 2010-10-07 MED ORDER — ZOLPIDEM TARTRATE ER 12.5 MG PO TBCR
12.5000 mg | EXTENDED_RELEASE_TABLET | Freq: Every evening | ORAL | Status: DC | PRN
Start: 2010-10-07 — End: 2010-10-22

## 2010-10-07 NOTE — Progress Notes (Signed)
S: 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 She received a steroid injection in the 3rd and 4th interspaces at her last visit and she states her pain was completely relieved for 7-10 days and she was able to go to both work and to the gym. Yesterday while at work she began having tingling pain in her foot which became quite severe and radiated up her entire leg. She describes the pain as burning, tingling and is associated with severe muscle spasms.  She continues to use her TENS unit 4 hrs a day and attempted this last night as well as ice and oxycodone with minimal relief. She states her current pain is 9/10.    ROS: Patient relates right foot and leg pain. She denies any other complaints.    Physical:  GEN: NAD, A&Ox3  VASC: Dorsalis pedis and posterior tibial pulses are palpable bilateral. The feet are cool to touch bilateral. There is slight ruberous appearance to the right foot compared to left. Capillary fill time is less than 3 seconds bilateral.  NEURO: There is heightened sensation to all aspects of the right foot. She has a radiating pain up her leg with percussion of the right tibial nerve.   INTEG: Skin is cool and moist, cooler on the right compared to the left foot. There are no open lesions or interdigital macerations.  MSK: Severe pain on slight palpation of the right foot.      A:  1. 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 Recommend further neurology consultation. She will be referred to an outside facility clinic which specializes in pain management  2. Rx Oxycodone 5 mg #30  3. Rx Ambien 1 month supply  4. Rx Valium 5 mg #30 PRN muscle spasm  5. Rx Prednisone taper from 80 mg x 3 days, 60 mg x 3 days, 40 mg x 3 days, 20 mg x 3 days, 10 mg x 3 days  6. Patient to follow up in 3 weeks

## 2010-10-10 ENCOUNTER — Encounter (HOSPITAL_BASED_OUTPATIENT_CLINIC_OR_DEPARTMENT_OTHER): Payer: Self-pay | Admitting: Podiatrist

## 2010-10-12 ENCOUNTER — Encounter (HOSPITAL_BASED_OUTPATIENT_CLINIC_OR_DEPARTMENT_OTHER): Payer: Self-pay

## 2010-10-13 ENCOUNTER — Encounter (HOSPITAL_BASED_OUTPATIENT_CLINIC_OR_DEPARTMENT_OTHER): Payer: Self-pay | Admitting: Podiatrist

## 2010-10-13 ENCOUNTER — Telehealth (HOSPITAL_BASED_OUTPATIENT_CLINIC_OR_DEPARTMENT_OTHER): Payer: Self-pay

## 2010-10-13 NOTE — Telephone Encounter (Signed)
Patient called on Sunday stating that the prednisone taper she was on was making her extremely agitated. She stated she was able to bear the agitation while at work, but when she gets home it is unbearable. Patient was hesitant to discontinue or lower dosage as the prednisone was significantly improving her pain. She works at [employer] and after discussion with her pharmacist, a rx for Ativan 1mg  was prescribed for her to take at night while on the taper. She was also give a rx for Vistaril to take during the day.

## 2010-10-13 NOTE — Telephone Encounter (Signed)
Patient called stating that she is still feeling very agitated on her prednisone. She is now on 60 mg prednisone daily and has tried Atarax, Ativan, Valium without relief. I discussed with her pharmacy as well as Dr. Rosilyn Mings and we are going to taper her off the high dose, down to 30 mg tomorrow, then 20 and 10 and she was instructed to maintain a longer lower dose. She is instructed to call if there are any problems.

## 2010-10-14 NOTE — Progress Notes (Signed)
Patient was seen with resident Dr. Buchanan.  I concur with their assessment and treatment plan.

## 2010-10-22 ENCOUNTER — Ambulatory Visit (HOSPITAL_BASED_OUTPATIENT_CLINIC_OR_DEPARTMENT_OTHER): Payer: PRIVATE HEALTH INSURANCE | Admitting: Podiatrist

## 2010-10-22 DIAGNOSIS — S9030XA Contusion of unspecified foot, initial encounter: Secondary | ICD-10-CM

## 2010-10-22 DIAGNOSIS — M792 Neuralgia and neuritis, unspecified: Secondary | ICD-10-CM

## 2010-10-22 DIAGNOSIS — G9050 Complex regional pain syndrome I, unspecified: Secondary | ICD-10-CM

## 2010-10-22 MED ORDER — PREDNISONE 5 MG PO TABS
5.00 mg | ORAL_TABLET | Freq: Three times a day (TID) | ORAL | Status: AC
Start: 2010-10-22 — End: 2010-11-21

## 2010-10-22 MED ORDER — ZOLPIDEM TARTRATE ER 12.5 MG PO TBCR
12.5000 mg | EXTENDED_RELEASE_TABLET | Freq: Every evening | ORAL | Status: AC | PRN
Start: 2010-10-22 — End: 2010-11-21

## 2010-10-22 NOTE — Progress Notes (Signed)
40 year old female presents for follow up for right foot pain due to RSD. Patient is at the end of a large prednisone taper. She is currently taking 10mg , and has two more days left of this. She states that up until her 20mg  dose, she did not experience any pain in her foot and did not require any narcotic pain medication. She states that at 20mg , she began to experience some "pins and needles" and that now on the 10mg  dose, she does experience some shooting pain. Pain is significantly improved from prior to beginning the taper. She states she is on her feet for more than 8 hours a day, and this makes the pain worse. She did experience some agitation while on the higher doses of the steroid, but this is now subsided. She continues to use the TENS unit.  ROS: Foot pain per above.     PMH, Meds, Allergies reviewed. Patient denies any changes and states that her asthma symptoms have actually significantly improved since being on the prednisone.     PE:  - Gen: A&Ox3, NAD  - RLE: Neurovascular status is intact. There is no increase or decrease in temperature of the foot. The is exquisite tenderness with palpation at the 3rd interspace, both proximally and distally (but worse distally). She also has generalized diffuse pain at the plantar aspect of the forefoot, as well as the dorsal aspect of the forefoot and midfoot.     A/P: RSD; Post-traumatic, right foot  - Discussed treatment options with patient. Considering she has gotten significant relief with the prednisone, it was decided to continue her on a lower dose for another month to see how she does. She will begin with 15mg  daily and either increase to 20mg  or decrease to 10mg  as necessary. She will continue with the TENS unit. Follow up in 4 weeks.

## 2010-10-26 ENCOUNTER — Encounter (HOSPITAL_BASED_OUTPATIENT_CLINIC_OR_DEPARTMENT_OTHER): Payer: Self-pay | Admitting: Podiatrist

## 2010-10-29 ENCOUNTER — Encounter (HOSPITAL_BASED_OUTPATIENT_CLINIC_OR_DEPARTMENT_OTHER): Payer: Self-pay | Admitting: Internal Medicine

## 2010-10-29 ENCOUNTER — Emergency Department (HOSPITAL_BASED_OUTPATIENT_CLINIC_OR_DEPARTMENT_OTHER)
Admission: RE | Admit: 2010-10-29 | Disposition: A | Discharge status: Home / Self Care | Payer: Self-pay | Source: Emergency Department | Attending: Emergency Medicine | Admitting: Emergency Medicine

## 2010-10-29 ENCOUNTER — Encounter (HOSPITAL_BASED_OUTPATIENT_CLINIC_OR_DEPARTMENT_OTHER): Payer: Self-pay

## 2010-10-29 LAB — BASIC METABOLIC PANEL
ANION GAP: 9 mmol/L (ref 2–25)
BUN (UREA NITROGEN): 12 mg/dl (ref 6–20)
CALCIUM: 9.2 mg/dl (ref 8.6–10.3)
CARBON DIOXIDE: 29 mmol/L (ref 22–32)
CHLORIDE: 98 mmol/L — ABNORMAL LOW (ref 101–111)
CREATININE: 1 mg/dl (ref 0.4–1.2)
ESTIMATED GLOMERULAR FILT RATE: >60 mL/min (ref >60)
Glucose Random: 128 mg/dl (ref 74–160)
POTASSIUM: 3 mmol/L — ABNORMAL LOW (ref 3.5–5.1)
SODIUM: 136 mmol/L (ref 135–144)

## 2010-10-29 LAB — CBC WITH PLATELET
HEMATOCRIT: 38.5 % (ref 36.0–48.0)
HEMOGLOBIN: 13.4 g/dl (ref 12.0–16.0)
MEAN CORP HGB CONC: 34.9 g/dl (ref 32.0–36.0)
MEAN CORPUSCULAR HGB: 30.7 pg (ref 27.0–33.0)
MEAN CORPUSCULAR VOL: 87.9 fl (ref 80.0–100.0)
MEAN PLATELET VOLUME: 9 fl (ref 6.4–10.8)
PLATELET COUNT: 172 10*3/uL (ref 150–400)
RBC DISTRIBUTION WIDTH: 12.1 % (ref 11.5–14.3)
RED BLOOD CELL COUNT: 4.37 M/uL — ABNORMAL LOW (ref 4.50–5.10)
WHITE BLOOD CELL COUNT: 8.6 10*3/uL (ref 4.0–10.8)

## 2010-10-29 LAB — XR CHEST PORTABLE

## 2010-10-29 LAB — HOLD BLUE TOP TUBE

## 2010-10-29 MED ORDER — PREDNISONE 20 MG PO TABS
40.00 mg | ORAL_TABLET | Freq: Every day | ORAL | Status: AC
Start: 2010-10-29 — End: 2010-11-03

## 2010-10-29 MED ORDER — IPRATROPIUM-ALBUTEROL 0.5-2.5 (3) MG/3ML IN SOLN
3.00 mL | Freq: Once | RESPIRATORY_TRACT | Status: AC
Start: 2010-10-29 — End: 2010-10-29
  Administered 2010-10-29: 3 mL via RESPIRATORY_TRACT

## 2010-10-29 MED ORDER — IPRATROPIUM-ALBUTEROL 0.5-2.5 (3) MG/3ML IN SOLN
3.00 mL | Freq: Once | RESPIRATORY_TRACT | Status: AC
Start: 2010-10-29 — End: 2010-10-29
  Administered 2010-10-29: 3 mL via RESPIRATORY_TRACT
  Filled 2010-10-29: qty 3

## 2010-10-29 MED ORDER — METHYLPREDNISOLONE SODIUM SUCC 125 MG IJ SOLR
125.00 mg | Freq: Once | INTRAMUSCULAR | Status: AC
Start: 2010-10-29 — End: 2010-10-29
  Administered 2010-10-29: 125 mg via INTRAVENOUS
  Filled 2010-10-29: qty 125

## 2010-10-29 MED ORDER — LORAZEPAM 1 MG PO TABS
1.00 mg | ORAL_TABLET | Freq: Once | ORAL | Status: AC
Start: 2010-10-29 — End: 2010-10-29
  Administered 2010-10-29: 1 mg via ORAL
  Filled 2010-10-29: qty 1

## 2010-10-29 MED ORDER — LORAZEPAM 0.5 MG PO TABS
0.5000 mg | ORAL_TABLET | Freq: Once | ORAL | Status: DC
Start: 2010-10-29 — End: 2010-10-29

## 2010-10-29 MED ORDER — LIDOCAINE VISCOUS 2 % MT SOLN
5.00 mL | Freq: Once | OROMUCOSAL | Status: AC
Start: 2010-10-29 — End: 2010-10-29
  Administered 2010-10-29: 5 mL via OROMUCOSAL
  Filled 2010-10-29: qty 20

## 2010-10-29 MED ORDER — POTASSIUM CHLORIDE 20 MEQ OR TBCR
20.00 meq | EXTENDED_RELEASE_TABLET | Freq: Once | ORAL | Status: AC
Start: 2010-10-29 — End: 2010-10-29
  Administered 2010-10-29: 20 meq via ORAL
  Filled 2010-10-29: qty 1

## 2010-10-29 NOTE — ED Triage Note (Signed)
Pt works at [employer] and inhaled some bleach setting off asthma/airway reaction.Has had 2 alboterol and 2 atrovent en route to the ED.

## 2010-10-29 NOTE — Discharge Instructions (Signed)
Rest.  Meds as directed.  Call your doctor tomorrow with a progress report, and to arrange follow up.  Return at any time if worse, or for new or different symptoms.       Index Illustration Illustration Related topics       Asthma   What is asthma?   Asthma is a lung condition that causes wheezing, coughing, and shortness of breath. It is caused by inflammation (swelling) of the lining of the airways in your lungs. Asthma is a chronic condition, which means you may have it the rest of your life.   You may start coughing or wheezing when you breathe in irritants or something you are allergic to. Cold air, chemicals, perfume, and smoke are examples of irritants. Examples of things you might be allergic to, called allergens, are dust, pollen, molds, and animal dander. A cold or the flu might also bring on an asthma attack.   Some people have coughing or wheezing only during or after physical activity. This is called exercise-induced asthma.   Asthma may be mild, moderate, or severe. An asthma attack may last a few minutes or for days. Attacks can happen anywhere and at any time. Severe asthma attacks can be fatal. It is very important to get prompt treatment for asthma attacks and to learn to manage your asthma so you can live a healthy, active life.   About 20 million Americans have asthma, and the number of people who have asthma is increasing worldwide.   How does it occur?   If you have asthma, the airways in your lungs are always somewhat inflamed, even when you do not have any symptoms. When your airways are exposed to irritants or allergens, the airways become more swollen and make more mucus. The tiny muscles in the walls of the airways contract. These reactions cause the airway openings to become smaller, making it harder for air to move in and out. Wheezing is the sound of air moving through the narrowed air passages. The extra mucus in the airways causes coughing.   Some of the factors that may increase the  risk of developing asthma are:   · low birth weight   · having one or more close family members who have asthma   · exposure to secondhand smoke or a lot of environmental pollutants (for example, in a large city)   · on-the-job exposure to chemicals, such as chemicals used in the manufacturing industry, farming, and hairdressing   · obesity.   What are the symptoms?   Symptoms are:   · wheezing (a high-pitched whistling sound when you breathe in or out)   · coughing   · shortness of breath, or feeling like you cannot get enough air   · chest tightness   You may find that there are things you used to do that you can no longer do because of your trouble with breathing.   How is it diagnosed?   A single attack of wheezing does not mean you have asthma. Some infections and chemicals can cause wheezing that lasts for a short time and then does not happen again. Your healthcare provider will ask about your history of breathing problems. You will have a physical exam. You may have one or more breathing tests. You may be tested before and after taking medicine to see how your symptoms respond to medicine.   How is it treated?   The goal of treatment is to allow you to live a   normal, active life. Proper treatment can reduce your day-to-day asthma problems as well as the chance that you will have a bad asthma attack or more problems in the future. Treatment will probably include prescribed medicines and the removal of obvious allergy-causing substances or irritants from your home.   Three types of medicines are used to control asthma:   · quick-relief medicines   · steroid medicines   · long-term-control medicines.   Quick-relief medicines (also called reliever, rescue, or quick-acting medicines)   Albuterol is the generic name of the most widely used quick-relief medicine. It is a type of medicine called a bronchodilator. Bronchodilators relax the muscles in the airways. When the muscles are relaxed, the airways become  larger, so there is more space for air to move in and out. You inhale the medicine by breathing it into your lungs as you spray it into your mouth. You use this medicine when you start to have an asthma attack. In some cases your provider may recommend that you use it on a regular schedule.   You should always carry a bronchodilator with you to use when you begin to wheeze. If you have exercise-induced asthma, you should use the medicine before exercise to prevent wheezing.   Steroid medicines for prolonged, severe attacks   Steroids are another type of medicine that may be used to control asthma symptoms. They are a type of anti-inflammatory medicine that is often used for treatment of a bad attack that has not responded to other treatment. The medicine is usually prednisone and you are usually given between 4 and 10 days of the steroid tablets to take with your other medicines to get your asthma back under control.   Long-term control-medicines (also called controller medicines)   In addition to using a quick-relief bronchodilator when you have asthma attacks, you may need to combine different types of long-term control medicines for the best control of your wheezing. Several types of medicines help prevent asthma attacks. These medicines are now considered the best and safest way to have long-term control of asthma. They help reduce the inflammation in your airways. They are taken on a regular schedule whether or not you are having symptoms.   Long-term-control medicine should be used all the time for year-round asthma. For seasonal asthma, your healthcare provider may instruct you to take a long-term-control medicine just during the months when you usually have asthma symptoms. The medicine does not work well if you start and stop it frequently, for example, every week or two.   Long-term-control medicines cannot stop attacks of wheezing after you have started wheezing. You must use a quick-relief medicine, such  as albuterol, when you are wheezing.   The goals of controller medicines are to:   · prevent asthma attacks   · prevent chronic asthma symptoms, such as shortness of breath   · let you have a fully active life, including participation in sports and other physical activities.   The medicines used most often for long-term control of asthma are:   · a long-acting, inhaled bronchodilator, such as salmeterol (Serevent) used 2 times a day   · inhaled steroids, such as Azmacort and Flovent, used 1 to 4 times a day   · a type of medicine called leukotriene modifiers, including zafirlukast (Accolate), zileuton (Zyflo), or montelukast (Singulair) pills taken daily.   Some of these medicines are available as combinations.   Other, less often used controller medicines include:   · theophylline, a pill   often taken at bedtime to prevent nighttime wheezing   · cromolyn or nedocromil, inhaled 3 to 4 times a day.   If you have severe persistent asthma, your healthcare provider may prescribe low-dose oral steroids for long-term control of the asthma. This medicine may be taken as tablets or a syrup.   You need to work closely with your healthcare provider to find the treatment right for you. Make sure you understand how to use each of your medicines. Some are quick-acting and meant to be used when you have an asthma attack. Others are slow acting and help prevent attacks but they do not help when you are having an attack.   Inhalers   Make sure you know how to use your inhaler correctly. Some inhalers work best if you hold them 2 inches in front of your mouth when you spray. This helps the medicine get to your lungs rather than getting stuck in your mouth. However, for some inhalers you need to put your mouth on the inhaler.   If you have an inhaler that should be used a couple of inches in front of your mouth and it is hard for you to hold the inhaler in the right position, ask your healthcare provider for a spacer tube. You can put  one end of the spacer in your mouth and attach the inhaler to the other end. This allows you to breathe in slowly and fully and to inhale more of the asthma medicine.   Be sure you ask your healthcare provider or pharmacist how your inhalers are supposed to be used. Read the directions that come with the inhalers. Also ask your pharmacist how you can know when your inhaler is empty so you can avoid getting caught without medicine.   Peak flow meter   Your breathing ability can change from day to day. For example, illness or seasonal allergies may make your airways more inflamed than usual. Your healthcare provider may prescribe a peak flow meter. You can use the peak flow meter to measure how well you are breathing. It can help you and your healthcare provider know when you might need to increase your dosage of medicine to prevent severe attacks of wheezing.   How long will the effects last?   Asthma is a chronic condition, even though you might not have any symptoms for decades. Asthma is more common in children than adults. People who had asthma as children often have no symptoms once they become adults, but the symptoms may come back later in life. Asthma that develops for the first time in mid- or late life usually continues to be a problem for the rest of your life.   How can I take care of myself?   Learn about asthma and its treatment.   · Learn to recognize signs and symptoms of an asthma attack. Many people with asthma either don't recognize their asthma symptoms or underestimate the severity of their symptoms. Pay attention to your symptoms and, if your healthcare provider recommends it, check your breathing with a peak flow meter.   · Work with your healthcare provider to develop a written asthma action plan. Following the plan will help you manage your asthma every day. It will help you recognize and handle asthma problems.   · Take your medicines exactly as prescribed. Always have your rescue medicine  on hand and available. If you are taking a long-term-controller medicine, be sure to take it every day, just as your provider   tells you. This prevents asthma attacks. If you are having wheezing even though you are using your controller medicines, let your healthcare provider know. Don't just stop them. If you are using both quick-relief and long-term control inhalers at the same time, use the quick relief inhaler first. Then wait several minutes before using the control inhaler.   · Learn what can trigger your symptoms and how to stay away from them. For example, you may need to cover your mattress, box springs, and pillows with zippered plastic covers. It may help to stay indoors when the humidity or pollen count is high. Avoid cigarette smoke.   You should also:   · See your healthcare provider for regular checkups as often as recommended.   · Ask your provider if it is OK for you to take aspirin. Some people with asthma are allergic to aspirin and it causes them to wheeze. Aspirin is more likely to cause problems than other anti-inflammatory medicines, such as ibuprofen or naproxen, but sometimes they can cause wheezing, too. Acetaminophen (Tylenol) does not cause wheezing.   · Get a flu vaccine every October.   If you often have problems with acid indigestion--a condition called gastroesophageal reflux disease, or GERD--your asthma symptoms may increase, especially at night. When you have GERD, stomach acid flows backward into the throat, irritating the upper airway and causing heartburn. If you have heartburn often, talk to your healthcare provider about it. Your provider may prescribe a medicine that reduces the acid in your stomach to help relieve GERD and asthma symptoms. You can also prevent or relieve symptoms of GERD by:   · losing weight if you are overweight.   · sleeping with your head elevated at least 4 inches.   Asthma can be a life-threatening condition. If your medicines do not seem to be working  to keep you breathing comfortably, contact your healthcare provider. If you are having an asthma attack and using your albuterol inhaler has not relieved your symptoms, you must get medical care right away. This may mean going to the emergency room or calling 911.   You can get more information from:   · American College of Allergy, Asthma and Immunology   Phone: 1-847-427-1200   Web site: http://www.acaai.org   · American Lung Association   Phone: 1-800-Lung-USA (586-4872)   Web site: http://www.lungusa.org   · Asthma and Allergy Foundation of America (AAFA)   Phone: 1-800-7ASTHMA (727-8462)   Web site: http://www.aafa.org   Developed by [employer].   Published by [employer].   Last modified: 2008-06-14   Last reviewed: 2008-01-05   This content is reviewed periodically and is subject to change as new health information becomes available. The information is intended to inform and educate and is not a replacement for medical evaluation, advice, diagnosis or treatment by a healthcare professional.   Adult Health Advisor 2010.1 Index  © 2010 [employer] and/or its affiliates. All Rights Reserved.

## 2010-10-29 NOTE — ED Notes (Signed)
Date of Visit: 10/29/2010      MEDICAL DOCTOR EVALUATION TIME:  9:58 p.m.    Nursing note reviewed.  Medication list reviewed.  The patient was seen in conjunction with the physician's assistant, Eileen Stanford Comeau.    HISTORY OF PRESENT ILLNESS:  The patient is a 40 year old woman who states that some chlorine bleach was spilled on the floor at her work place this morning.  Within about 15 minutes, the patient developed severe shortness of breath, wheezing, and a burning sensation in her airway.  She complained of chest tightness.  She states that she used her metered dose inhaler many times without adequate improvement.  The patient went to her primary care clinic, which was nearby.  They administered a nebulizer and called the ambulance.  She was given a total of 3 nebulizer treatments en route prior to arrival at the Emergency Department.  The patient has a history of asthma and has required hospitalization a number of times in the past.  She states that her asthma usually has a trigger like pollen or smoke.  She also has a history of hypothyroidism and hypertension.  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    REVIEW OF SYSTEMS:  All other systems are reviewed and are negative.    SOCIAL HISTORY:  She does not smoke cigarettes.  She does not drink alcohol.  She does not take street drugs.    PHYSICAL EXAMINATION:  GENERAL:  The patient is alert and appropriate.  She appears moderately dyspneic.  VITAL SIGNS:  Reviewed.  The pulse is 109.  The respiratory rate is 34.  The pulse oximetry is 100%.  SKIN:  Warm and dry.  The skin color and turgor are normal.  HEENT:  The eyes are clear.  ENT is clear.  The mucous membranes are moist.  The airway is open.  LYMPHATICS:  No palpable cervical lymphadenopathy.  LUNGS:  Show wheezes bilaterally without frank signs of consolidation.  CARDIOVASCULAR:  Regular rate and rhythm without murmurs.  ABDOMEN:  Benign.  MUSCULOSKELETAL:  The extremities are grossly normal  x4.  NEUROLOGIC:  Grossly normal.  GENITOURINARY:  No CVAT.  PSYCHIATRIC:  Normal affect.    HOSPITAL COURSE:  The patient was placed into bed 11 for evaluation and treatment.  The patient was given 3 sequential DuoNeb nebulizer treatments.  An intravenous was established.  The patient was given Solu-Medrol 125 mg IV.  Blood was obtained for laboratory analysis.  The basic metabolic profile showed a serum potassium decreased at 3.0.  The chloride was decreased at 98.  The remainder was unremarkable.  The white blood count was normal at 8.6.  The hematocrit was normal at 39.  A chest x-ray was obtained.  There was no acute cardiopulmonary abnormality.  The patient remained wheezy and dyspneic for quite some time.  After several hours of treatment, she did improve.  On reexam, the lungs were clear.  She was no longer dyspneic, and the respiratory rate had normalized to 18.  She was felt to be satisfactory for discharge.  The patient will be continued on prednisone 40 mg daily for 5 additional days.  She states she has plenty of DuoNebs for her home nebulizer as well as a metered dose inhaler.  She is advised to call her primary care provider tomorrow with a progress report and to arrange followup.  She may return to the Emergency Department at any time if worse or if she develops new or different symptoms.  She  is given printed aftercare instructions for asthma.  The chlorine fumes are respiratory irritants, and there is no concern for systemic toxicity.    DIAGNOSIS:  Acute asthma.    ___________________________  Reviewed and Electronically Signed By: Freddie Breech MD  Sig Date: 10/30/2010  Sig Time: 10:53:05  Dictated By: Freddie Breech MD  Dict Date: 10/29/2010 Dict Time: 05 42 PM    Dictation Date and Time:10/29/2010 17:42:18  Transcription Date and Time:10/29/2010 17:57:24  eScription Dictation id: [id number] Confirmation # :[id number]    DICTATED BY: Earlyne Iba Shin Lamour MDRALPH H Karthika Glasper MDD:10/29/2010 17:42:18  T:10/29/2010 17:57:24 KF Job#: [id number]

## 2010-10-30 ENCOUNTER — Ambulatory Visit (HOSPITAL_BASED_OUTPATIENT_CLINIC_OR_DEPARTMENT_OTHER): Payer: PRIVATE HEALTH INSURANCE

## 2010-10-30 ENCOUNTER — Encounter (HOSPITAL_BASED_OUTPATIENT_CLINIC_OR_DEPARTMENT_OTHER): Payer: Self-pay

## 2010-10-30 ENCOUNTER — Encounter (HOSPITAL_BASED_OUTPATIENT_CLINIC_OR_DEPARTMENT_OTHER): Payer: Self-pay | Admitting: Internal Medicine

## 2010-10-30 VITALS — BP 110/70 | HR 105 | Temp 98.5°F | Resp 18 | Ht 64.0 in | Wt 148.0 lb

## 2010-10-30 DIAGNOSIS — J45901 Unspecified asthma with (acute) exacerbation: Secondary | ICD-10-CM

## 2010-10-30 MED ORDER — FLUTICASONE-SALMETEROL 500-50 MCG/DOSE IN AEPB
1.0000 | INHALATION_SPRAY | Freq: Two times a day (BID) | RESPIRATORY_TRACT | Status: DC
Start: 2010-10-30 — End: 2010-12-22

## 2010-10-30 MED ORDER — IPRATROPIUM-ALBUTEROL 0.5-2.5 (3) MG/3ML IN SOLN
3.0000 mL | Freq: Four times a day (QID) | RESPIRATORY_TRACT | Status: DC | PRN
Start: 2010-10-30 — End: 2010-11-12

## 2010-10-30 MED ORDER — VENTOLIN HFA 108 (90 BASE) MCG/ACT IN AERS
2.0000 | INHALATION_SPRAY | RESPIRATORY_TRACT | Status: DC | PRN
Start: 2010-10-30 — End: 2010-12-22

## 2010-10-30 MED ORDER — AEROCHAMBER MAX W/MASK MEDIUM MISC
Status: AC
Start: 2010-10-30 — End: 2011-10-30

## 2010-10-30 MED ORDER — BENZONATATE 100 MG PO CAPS
100.0000 mg | ORAL_CAPSULE | Freq: Three times a day (TID) | ORAL | Status: DC | PRN
Start: 2010-10-30 — End: 2010-11-19

## 2010-10-30 MED ORDER — NEBULIZER MISC
1.00 | Status: AC | PRN
Start: 2010-10-30 — End: 2011-10-30

## 2010-10-30 MED ORDER — HYDROXYZINE HCL 25 MG PO TABS
25.0000 mg | ORAL_TABLET | Freq: Three times a day (TID) | ORAL | Status: DC | PRN
Start: 2010-10-30 — End: 2011-07-20

## 2010-10-30 MED ORDER — ALBUTEROL SULFATE (2.5 MG/3ML) 0.083% IN NEBU
2.5000 mg | INHALATION_SOLUTION | Freq: Four times a day (QID) | RESPIRATORY_TRACT | Status: AC | PRN
Start: 2010-10-30 — End: 2011-09-30

## 2010-10-30 NOTE — Progress Notes (Signed)
HPI Comments: Madison Mckay is a 40 year old female her for f/u asthma exac    Treated in ED yest after exposure to bleach yest which triggered attack  Given Solumedrol, and several Duonebs  Sent home with Prednisone burst rx  Was already taking steroid taper for foot prob but stopped this today  At home uses Advair BID, Ventolin (using this once daily since change of seasons)  Asthma hx: had it since childhood, mult hospitalizations/ICU admissions, no intubations  Triggers: bleach, environmental exposure, smoke    Today feeling unchanged from when left ED yest though better from acute attack  Coughing, having chest tightness and back pain from coughing  Didn;'t sleep well 2/2 to agitation from steroid  Needs new rx's b/c this is under workman's comp  Last Duoneb 7:00 am       Physical Exam   Vitals reviewed.  Constitutional: She appears well-developed and well-nourished. No distress.        Sitting comfortably on exam table  Speaking in full sentences, no acces mm use   HENT:   Mouth/Throat: Oropharynx is clear and moist.   Cardiovascular: Normal rate, regular rhythm and normal heart sounds.    No murmur heard.  Pulmonary/Chest: Effort normal. No respiratory distress. She has no rales. She exhibits no tenderness.        Reasonable air movement throughout, few scattered exp wheeze           493.75M Asthma exacerbation  (primary encounter diagnosis)  Comment: improved from yesterday's ED visit, no acute distress, peak flow not back to baseline yet  Pt knowledgeable about dz  Plan: VENTOLIN HFA 108 (90 BASE) MCG/ACT inhaler,         fluticasone-salmeterol (ADVAIR DISKUS) 500-50         MCG/DOSE AEPB Aerosol Powder, albuterol         (PROVENTIL) (2.5 MG/3ML) 0.083% nebulizer         solution, ipratropium-albuterol (DUO-NEB)         0.5-2.5 (3) MG/3ML SOLN Inhalation Solution,         hydrOXYzine (ATARAX) 25 MG tablet, AIRWAY         INHALATION TREATMENT, Spacer/Aero-Holding         Chambers (AEROCHAMBER MAX W/MASK  MEDIUM)         device, benzonatate (TESSALON) 100 MG capsule        Refilled meds as needs new rx for workman's comp        Cont Duoneb QID, Alb neb in b/t Duoneb, cont Advair and Prednisone burst        Re-eval early next week for need to extend Prednisone        Seek care immed if feels breathing worsens despite home meds, sudden SOB    *Reviewed treatment plan with patient.  All questions answered.  PT agrees with plan.  *AVS printed and reviewed with pt.

## 2010-10-30 NOTE — Progress Notes (Signed)
.    Pt. Safe at home

## 2010-10-30 NOTE — Progress Notes (Signed)
40 yo woman here for FU acute asthma exacerbation     HPI:   Was seen in ED for acute asthma exacerbation yesterday after bleach spill at work   Sx have not changed since being discharged from the ED yesterday   C/o Chest tightness, radiating to her back, feels pressure on her chest   Nonproductive cough   +Wheezing   Didn't sleep well, feels fatigue  Prednisone has been causing some anxiety and agitation     Needs new scripts for asthma control since now under workman's comp    ROS: As above, additionally --   No fever, shaking chills, night sweats  No sore throat, ear pain   No chest pain  No nausea, vomiting, diarrhea, constipation, black or bloody stool   No headaches, numbness or tingling   No urinary symptoms including dysuria, frequency   No skin changes    Past medical history, medications, allergies reviewed in electronic medical record.     Physical Exam:   BP 110/70   Pulse 105   Temp(Src) 98.5 F (36.9 C) (Temporal)   Resp 18   Ht 5\' 4"  (1.626 m)   Wt 148 lb (67.132 kg)   BMI 25.40 kg/m2   SpO2 98%  In general the patient is well appearing in no respiratory distress  HEENT: Normocephalic, atraumatic. Pupils are equal, round, and reactive to light. Extraocular movements are intact.  Oropharynx is clear without lesions or erythema. Mucous membranes are moist.   Neck is supple with no lymphadenopathy.   Heart is regular rate and rhythm, S1, S2, no murmurs, gallops or rubs.   Chest- No use of accessory muscles of respiration. Can communicate in full sentences. Mild wheezes are heart bilaterally, but there is good air movement.    Assessment and Plan:   This is a 40 year old woman here for FU of acute asthma exacerbation.     She is not currently in any respiratory distress. She is instructed to continue her medications and follow up in 1 week, sooner PRN.

## 2010-10-30 NOTE — Telephone Encounter (Signed)
Pt here @ [facility] for apt with Dr. Delories Heinz.

## 2010-11-01 ENCOUNTER — Encounter (HOSPITAL_BASED_OUTPATIENT_CLINIC_OR_DEPARTMENT_OTHER): Payer: Self-pay

## 2010-11-01 NOTE — Progress Notes (Signed)
Patient was seen with resident Dr. Ben-Ad.  I concur with their assessment and treatment plan.

## 2010-11-03 ENCOUNTER — Other Ambulatory Visit (HOSPITAL_BASED_OUTPATIENT_CLINIC_OR_DEPARTMENT_OTHER): Payer: Self-pay | Admitting: Internal Medicine

## 2010-11-03 DIAGNOSIS — J45909 Unspecified asthma, uncomplicated: Secondary | ICD-10-CM

## 2010-11-03 MED ORDER — POTASSIUM CHLORIDE 10 MEQ PO TBCR
10.0000 meq | EXTENDED_RELEASE_TABLET | Freq: Every day | ORAL | Status: DC
Start: 2010-11-03 — End: 2010-12-08

## 2010-11-03 MED ORDER — FUROSEMIDE 20 MG PO TABS
20.0000 mg | ORAL_TABLET | Freq: Two times a day (BID) | ORAL | Status: DC
Start: 2010-11-03 — End: 2010-12-08

## 2010-11-03 NOTE — Telephone Encounter (Signed)
Madison Mckay is a 40 year old female has requested a refill of Diazepam 5 mg. This medication was last prescribed on 10/07/2010 with an end date of 11/07/2010    Other Med Adult:  Most Recent BP Reading(s)  10/30/10 : 110/70        No results found for this basename: cholesterol:1    No results found for this basename: LDL:1    No results found for this basename: HDL:1    No results found for this basename: tg:1        No results found for this basename: TSHSC:1        THYROID STIM HORMONE (uIU/mL)   Date     Date  Value    07/06/2010  0.46    ----------      No results found for this basename: hgba1c:1        No results found for this basename: INR:3       Documented [facility] [address] [id number] [phone] Fax: [phone]

## 2010-11-03 NOTE — Telephone Encounter (Signed)
Message from MyChart:  Original authorizing provider: Tillman Sers, DPM, DPM    [name] would like a refill of the following medications:  diazepam (VALIUM) 5 MG tablet Tillman Sers, DPM, DPM]    Preferred pharmacy: [facility] [id number] [place]    Comment:

## 2010-11-03 NOTE — Telephone Encounter (Signed)
Message copied by Threasa Heads on Tue Nov 03, 2010 12:24 PM  ------       Message from: Natalia Leatherwood       Created: Tue Nov 03, 2010 10:45 AM       Regarding: refill          [facility]              Person calling on behalf of patient: Pharmacy [facility] Pharmacist               May list multiple medications in this section       Medicine Name:        1) KLOR CON 10mg        2) FUROSEMIDE 20mg         Dosage:        fequency (how many pills, how many times a day):        Number of pills left:        Documented patient preferred pharmacies:       [facility] [id number] [place]              Phone: [phone] Fax: [phone]              Pharmacy Name:        Pharmacy Telephone Number:        Pharmacy  Fax Number:        CALL BACK NUMBER:        Cell phone:        Other phone:              Available times:              Patient's language of care: English              Patient does not need an interpreter.

## 2010-11-04 ENCOUNTER — Other Ambulatory Visit (HOSPITAL_BASED_OUTPATIENT_CLINIC_OR_DEPARTMENT_OTHER): Payer: Self-pay | Admitting: Podiatrist

## 2010-11-04 ENCOUNTER — Ambulatory Visit (HOSPITAL_BASED_OUTPATIENT_CLINIC_OR_DEPARTMENT_OTHER): Payer: PRIVATE HEALTH INSURANCE | Admitting: Internal Medicine

## 2010-11-04 ENCOUNTER — Encounter (HOSPITAL_BASED_OUTPATIENT_CLINIC_OR_DEPARTMENT_OTHER): Payer: Self-pay | Admitting: Internal Medicine

## 2010-11-04 VITALS — BP 110/70 | HR 109 | Temp 98.3°F | Resp 16 | Wt 146.0 lb

## 2010-11-04 DIAGNOSIS — J45901 Unspecified asthma with (acute) exacerbation: Secondary | ICD-10-CM

## 2010-11-04 DIAGNOSIS — J45909 Unspecified asthma, uncomplicated: Secondary | ICD-10-CM

## 2010-11-04 MED ORDER — PREDNISONE 20 MG PO TABS
20.00 mg | ORAL_TABLET | Freq: Every day | ORAL | Status: AC
Start: 2010-11-04 — End: 2010-11-18

## 2010-11-04 MED ORDER — DIAZEPAM 5 MG PO TABS
5.0000 mg | ORAL_TABLET | Freq: Two times a day (BID) | ORAL | Status: DC | PRN
Start: 2010-11-04 — End: 2010-11-04

## 2010-11-04 MED ORDER — LORAZEPAM 1 MG PO TABS
1.0000 mg | ORAL_TABLET | Freq: Three times a day (TID) | ORAL | Status: DC | PRN
Start: 2010-11-04 — End: 2010-11-19

## 2010-11-04 NOTE — Progress Notes (Signed)
Pt is safe at home
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
## 2010-11-05 ENCOUNTER — Telehealth (HOSPITAL_BASED_OUTPATIENT_CLINIC_OR_DEPARTMENT_OTHER): Payer: Self-pay | Admitting: Internal Medicine

## 2010-11-05 ENCOUNTER — Encounter (HOSPITAL_BASED_OUTPATIENT_CLINIC_OR_DEPARTMENT_OTHER): Payer: Self-pay | Admitting: Internal Medicine

## 2010-11-05 ENCOUNTER — Ambulatory Visit (HOSPITAL_BASED_OUTPATIENT_CLINIC_OR_DEPARTMENT_OTHER): Payer: PRIVATE HEALTH INSURANCE | Admitting: Internal Medicine

## 2010-11-05 ENCOUNTER — Ambulatory Visit (HOSPITAL_BASED_OUTPATIENT_CLINIC_OR_DEPARTMENT_OTHER): Payer: Self-pay | Admitting: Internal Medicine

## 2010-11-05 VITALS — BP 120/70 | HR 118 | Temp 97.0°F | Resp 32 | Wt 146.0 lb

## 2010-11-05 DIAGNOSIS — J45909 Unspecified asthma, uncomplicated: Secondary | ICD-10-CM

## 2010-11-05 DIAGNOSIS — Z5689 Other problems related to employment: Secondary | ICD-10-CM

## 2010-11-05 LAB — XR CHEST 2 VIEWS

## 2010-11-05 NOTE — Telephone Encounter (Signed)
Placed call to nicole adams  [employer].   [phone]  Claim [id number]    Left clear and detail message with all info necessary. Regarding if meds were approved.

## 2010-11-05 NOTE — Telephone Encounter (Signed)
Prior authorization required for medication: pa for lorazepam (ATIVAN) 1 MG tablet  Membership ID (insurance): [id number]  if Medicare insurance , Prescription drug plan:
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
## 2010-11-06 ENCOUNTER — Encounter (HOSPITAL_BASED_OUTPATIENT_CLINIC_OR_DEPARTMENT_OTHER): Payer: Self-pay | Admitting: Internal Medicine

## 2010-11-06 ENCOUNTER — Ambulatory Visit (HOSPITAL_BASED_OUTPATIENT_CLINIC_OR_DEPARTMENT_OTHER): Payer: 59

## 2010-11-07 ENCOUNTER — Encounter (HOSPITAL_BASED_OUTPATIENT_CLINIC_OR_DEPARTMENT_OTHER): Payer: Self-pay | Admitting: Internal Medicine

## 2010-11-09 ENCOUNTER — Telehealth (HOSPITAL_BASED_OUTPATIENT_CLINIC_OR_DEPARTMENT_OTHER): Payer: Self-pay | Admitting: Registered Nurse

## 2010-11-09 DIAGNOSIS — J45909 Unspecified asthma, uncomplicated: Secondary | ICD-10-CM

## 2010-11-09 MED ORDER — NYSTATIN 100000 UNIT/ML MT SUSP
500000.00 [IU] | Freq: Four times a day (QID) | OROMUCOSAL | Status: AC
Start: 2010-11-09 — End: 2010-11-23

## 2010-11-09 NOTE — Telephone Encounter (Signed)
Message copied by Ulla Potash on Mon Nov 09, 2010  3:10 PM  ------       Message from: AMADO-LOUIS, LISA       Created: Mon Nov 09, 2010  3:04 PM       Regarding: questions nurse.          [facility]              Tonika Eden [id number], 40 year old, female, Telephone Information:       Home Phone      [phone]       Work Phone      Not on file.       Mobile          [phone]                     CALL BACK NUMBER: [phone]       Best time to call back:        Cell phone:        Other phone:              Available times:              Patient's language of care: English              Patient does not need an interpreter.              Patient's PCP: Mitchell Heir, MD              Person calling on behalf of patient: Patient (self)              Calls today to speak to nurse only.       with questions and concerns.              Patient's Preferred Pharmacy:        [facility] [address] [facility] [id number]               Phone: [phone] Fax: [phone]

## 2010-11-09 NOTE — Telephone Encounter (Signed)
Spoke with patient  1.  Was feeling a little better for only a day or two  But now bad again, thinks the rain is worsen her symptoms  Madison Mckay to work today and yesterday and 'thought I was going to die'  Needs a note to stay home from work for the next couple for days  2.  Also workers comp isn't paying for the meds  Needs the visit diagnosis to be work related   Work related, bleach and chemical exposure related asthma exacerbation  3.  Now also with thrush from the inhalers  Would like something for treatment    RN to notify provider  Agrees with paln

## 2010-11-09 NOTE — Telephone Encounter (Signed)
Ordered nystatin swish and swallow for thrush. Will check with occ health re diagnosis and will get back to her.

## 2010-11-10 ENCOUNTER — Telehealth (HOSPITAL_BASED_OUTPATIENT_CLINIC_OR_DEPARTMENT_OTHER): Payer: Self-pay | Admitting: Registered Nurse

## 2010-11-10 ENCOUNTER — Encounter (HOSPITAL_BASED_OUTPATIENT_CLINIC_OR_DEPARTMENT_OTHER): Payer: Self-pay | Admitting: Internal Medicine

## 2010-11-10 NOTE — Telephone Encounter (Signed)
Letter written and left at front desk for pick up.

## 2010-11-10 NOTE — Telephone Encounter (Signed)
','<  More Detail >>   From Mitchell Heir   [facility] Tuesday November 10, 2010 11:58 AM   To Elouise Munroe   Phone [phone]   Subject FW: Need a Letter/Form   Patient Madison Mckay [[id number]] (DOB: 07/16/1970)   Phone Entered Pt Home     [phone]    Message [name] with me.    thanks  ----- Message -----  From: Elouise Munroe  Sent: 11/10/2010 8:03 AM  To: Mitchell Heir  Subject: FW: Need a Letter/Form     [name]:  If you ok, I will write.  Tx  ----- Message -----  From: [name]  Sent: 11/10/2010 7:57 AM  To: Ec Mychart Messages  Subject: Need a Letter/Form     I need a letter excusing me from work today 11/10/2010 to 11/11/2010. I woke up several times in the night with SOB and tightness in my chest. It was very difficult to make it though work yesterday. In my 7hour shift I needed 4 nebulizer treatments and had constant SOB and cough. If you have any questions you can call me at [phone]. Please let me know when the letter is ready and thank you for calling in the thrush medicine.    Darl Pikes

## 2010-11-11 ENCOUNTER — Telehealth (HOSPITAL_BASED_OUTPATIENT_CLINIC_OR_DEPARTMENT_OTHER): Payer: Self-pay | Admitting: Internal Medicine

## 2010-11-11 ENCOUNTER — Encounter (HOSPITAL_BASED_OUTPATIENT_CLINIC_OR_DEPARTMENT_OTHER): Payer: Self-pay

## 2010-11-11 DIAGNOSIS — J45901 Unspecified asthma with (acute) exacerbation: Secondary | ICD-10-CM

## 2010-11-11 NOTE — Telephone Encounter (Signed)
Pt's appt is on 11/17/10 with Dr. Trinna Balloon @ [facility]. Pt is aware.

## 2010-11-11 NOTE — Telephone Encounter (Signed)
Voice mail to call clinic back

## 2010-11-11 NOTE — Telephone Encounter (Signed)
TC returned to patient.  Who says that she sent a my chart message to Dr. Homero Fellers;  Regarding her medication.  To let her know.  RN informs patient that   Will  Route message to her   Pt verbalizes understanding and agrees with plan.

## 2010-11-12 ENCOUNTER — Other Ambulatory Visit (HOSPITAL_BASED_OUTPATIENT_CLINIC_OR_DEPARTMENT_OTHER): Payer: Self-pay | Admitting: Registered Nurse

## 2010-11-12 ENCOUNTER — Telehealth (HOSPITAL_BASED_OUTPATIENT_CLINIC_OR_DEPARTMENT_OTHER): Payer: Self-pay | Admitting: Internal Medicine

## 2010-11-12 ENCOUNTER — Other Ambulatory Visit (HOSPITAL_BASED_OUTPATIENT_CLINIC_OR_DEPARTMENT_OTHER): Payer: Self-pay | Admitting: Internal Medicine

## 2010-11-12 DIAGNOSIS — J45901 Unspecified asthma with (acute) exacerbation: Secondary | ICD-10-CM

## 2010-11-12 MED ORDER — IPRATROPIUM-ALBUTEROL 0.5-2.5 (3) MG/3ML IN SOLN
3.0000 mL | Freq: Four times a day (QID) | RESPIRATORY_TRACT | Status: DC | PRN
Start: 2010-11-12 — End: 2010-11-12

## 2010-11-12 MED ORDER — IPRATROPIUM-ALBUTEROL 0.5-2.5 (3) MG/3ML IN SOLN
3.0000 mL | Freq: Four times a day (QID) | RESPIRATORY_TRACT | Status: DC | PRN
Start: 2010-11-12 — End: 2010-12-22

## 2010-11-12 NOTE — Telephone Encounter (Signed)
Message copied by Evangeline Gula on Thu Nov 12, 2010 11:10 AM  ------       Message from: Larena Glassman       Created: Thu Nov 12, 2010 10:06 AM       Regarding: Refill       Contact: [phone]         [facility]              [facility] on [facility] of [facility]              May list multiple medications in this section       Medicine Name: ipratropium-albuterol (DUO-NEB) 0.5-2.5 (3) MG/3ML SOLN Inhalation Solution       Dosage:        Frequency (how many pills, how many times a day):        Number of pills left:        Documented patient preferred pharmacies:       [facility] [address] [id number] [place]              Phone: [phone] Fax: [phone]              Pharmacy Name:        Pharmacy Telephone Number:        Pharmacy  Fax Number:               CALL BACK NUMBER:[phone]        Cell phone:        Other phone:              Available times:              Patient's language of care: English              Patient does not need an interpreter.

## 2010-11-12 NOTE — Telephone Encounter (Signed)
Madison Mckay is a 40 year old female has requested a refill of Duoneb.  [facility] Normal script from 5/25. Please resend electronically.  Other Med Adult:  Most Recent BP Reading(s)  11/05/10 : 120/70        No results found for this basename: cholesterol:1    No results found for this basename: LDL:1    No results found for this basename: HDL:1    No results found for this basename: tg:1        No results found for this basename: TSHSC:1        THYROID STIM HORMONE (uIU/mL)   Date     Date  Value    07/06/2010  0.46    ----------      No results found for this basename: hgba1c:1        No results found for this basename: INR:3       Documented patient preferred pharmacies[facility]  [id number] [place] [phone] Fax: [phone]

## 2010-11-12 NOTE — Telephone Encounter (Signed)
Patient would like a box, to avoid over charging on co-pay  Please approve large dispense amount  Patient understands usage

## 2010-11-12 NOTE — Telephone Encounter (Signed)
Madison Mckay is a 40 year old female has requested a refill of DuoNeb.    -Patient is requesting a box to avoid over charging on copay    Other Med Adult:  Most Recent BP Reading(s)  11/05/10 : 120/70        No results found for this basename: cholesterol:1    No results found for this basename: LDL:1    No results found for this basename: HDL:1    No results found for this basename: tg:1        No results found for this basename: TSHSC:1        THYROID STIM HORMONE (uIU/mL)   Date     Date  Value    07/06/2010  0.46    ----------      No results found for this basename: hgba1c:1        No results found for this basename: INR:3       Documented patient preferred pharmacies:  [id number] [place] [phone] Fax: [phone]

## 2010-11-12 NOTE — Telephone Encounter (Signed)
Rx approved
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
## 2010-11-12 NOTE — Telephone Encounter (Signed)
[facility]   Everlean Patterson and diazepam June 4th  Lorazepam May 31st   Hydroxyzine 29th 10 day supply

## 2010-11-12 NOTE — Telephone Encounter (Signed)
Message from MyChart:  Original authorizing provider: Dorothy Puffer. Nilsa Nutting would like a refill of the following medications:  hydrOXYzine (ATARAX) 25 MG tablet [Nicole N. Weathers]    Preferred pharmacy: [facility] [id number] [place]    Comment:  I am requesting the quantity of my prescription for albuterol to be increased to 300 and the Duoneb to 360. I am running out of medication and have to pay through private insurance and I pay the same for 1 box as I do for 4. Also I am requesting some viscous lidocaine because my throat is very sore and it is hard to swallow. I am seeing the pulmonary MD Tuesday so they will be able to order my medications after this. Thank you in advance, Madison Mckay
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
## 2010-11-13 ENCOUNTER — Telehealth (HOSPITAL_BASED_OUTPATIENT_CLINIC_OR_DEPARTMENT_OTHER): Payer: Self-pay | Admitting: Registered Nurse

## 2010-11-13 ENCOUNTER — Telehealth (HOSPITAL_BASED_OUTPATIENT_CLINIC_OR_DEPARTMENT_OTHER): Payer: Self-pay | Admitting: Internal Medicine
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
## 2010-11-13 NOTE — Telephone Encounter (Signed)
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   Using nebs 6X times a day, albuterol inhalers in between treatments  Her fingers shakes at work  And she isn't improving with all that Albuterol anyway  Still with respiratory struggles and cough  Duo-Neb traditionally inhalers for emergency and COPD, wonders if this is best long term usage  Not generally used in outpatient, unless Albuterol not tolerated   On advair too  Maybe all these things aren't working  Pulmocort might be be a better option, lower her albuterol intake  Wanted to make PCP aware  Not sure if she would come in for eval and discussion about alternatives  But wanted PCP to know   RN to notify provider  Agrees with plan

## 2010-11-13 NOTE — Telephone Encounter (Signed)
Appreciate her concern, however, not authorized to discuss patient with her therefore will not call back.

## 2010-11-13 NOTE — Telephone Encounter (Signed)
Message copied by Ulla Potash on Fri Nov 13, 2010  4:29 PM  ------       Message from: [name]       Created: Fri Nov 13, 2010  3:35 PM       Regarding: speak  to provider regarding  Medication         [facility]              Madison Mckay [id number], 40 year old, female, Telephone Information:       Home Phone      [phone]       Work Phone      Not on file.       Mobile          [phone]                     [name] NUMBER: [phone]       Best time to call back: anytime       Cell phone:        Other phone:              Available times:              Patient's language of care: English              Patient does not need an interpreter.              Patient's PCP: Mitchell Heir, MD              Person calling on behalf of patient: pharmacist              Calls today to speak to nurse  Regarding  Medication: ipratropium-albuterol (DUO-NEB) 0.5-2.5 (3) MG/3ML SOLN Inhalation Solution and Albuterol. Pharmacist said this medications is given to the patient frequently.              Thanks                     Patient's Preferred Pharmacy:        [facility] [id number] [place]              Phone: [phone] Fax: [phone]

## 2010-11-14 ENCOUNTER — Encounter (HOSPITAL_BASED_OUTPATIENT_CLINIC_OR_DEPARTMENT_OTHER): Payer: Self-pay

## 2010-11-14 ENCOUNTER — Emergency Department (HOSPITAL_BASED_OUTPATIENT_CLINIC_OR_DEPARTMENT_OTHER)
Admission: RE | Admit: 2010-11-14 | Disposition: A | Discharge status: Home / Self Care | Payer: Self-pay | Source: Emergency Department

## 2010-11-14 MED ORDER — METHYLPREDNISOLONE SODIUM SUCC 125 MG IJ SOLR
125.00 mg | Freq: Once | INTRAMUSCULAR | Status: AC
Start: 2010-11-14 — End: 2010-11-14
  Administered 2010-11-14: 125 mg via INTRAVENOUS
  Filled 2010-11-14: qty 125

## 2010-11-14 MED ORDER — LIDOCAINE VISCOUS 2 % MT SOLN
5.00 mL | OROMUCOSAL | Status: AC | PRN
Start: 2010-11-14 — End: 2010-11-16

## 2010-11-14 MED ORDER — IPRATROPIUM-ALBUTEROL 0.5-2.5 (3) MG/3ML IN SOLN
3.00 mL | Freq: Once | RESPIRATORY_TRACT | Status: AC
Start: 2010-11-14 — End: 2010-11-14
  Administered 2010-11-14: 3 mL via RESPIRATORY_TRACT
  Filled 2010-11-14: qty 3

## 2010-11-14 MED ORDER — ALBUTEROL SULFATE (2.5 MG/3ML) 0.083% IN NEBU
2.50 mg | INHALATION_SOLUTION | Freq: Once | RESPIRATORY_TRACT | Status: AC
Start: 2010-11-14 — End: 2010-11-14
  Administered 2010-11-14: 2.5 mg via RESPIRATORY_TRACT
  Filled 2010-11-14: qty 3

## 2010-11-14 MED ORDER — SODIUM CHLORIDE 0.9 % IV BOLUS
1000.00 mL | Freq: Once | INTRAVENOUS | Status: AC
Start: 2010-11-14 — End: 2010-11-14
  Administered 2010-11-14: 1000 mL via INTRAVENOUS
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
## 2010-11-14 NOTE — ED Notes (Signed)
Pt alert,Has slight audible wheeze.VS:96.5 -105- 24-100% RA 111/58.

## 2010-11-14 NOTE — ED Notes (Signed)
Patient Disposition    Patient education for diagnosis, medications, activity, diet and follow-up.  Patient left ED 8:52 AM.  Patient rep received written instructions.  Interpreter to provide instructions: no    Discharged to: home

## 2010-11-14 NOTE — Discharge Instructions (Signed)
·   You should follow up with Dr. Kem Kays as scheduled this coming Tuesday   Take 50 mg of prednisone daily until you see Dr. Kem Kays   If you have worsening shortness of breath or difficulty breathing, please come back to the emergency department

## 2010-11-14 NOTE — ED Notes (Signed)
Pt alert, no longer wheezing.VS:114/77 P109,RESP18, Sat 98 RA.

## 2010-11-14 NOTE — ED Triage Note (Signed)
Sob started this morning with chest tightness and cough

## 2010-11-16 ENCOUNTER — Telehealth (HOSPITAL_BASED_OUTPATIENT_CLINIC_OR_DEPARTMENT_OTHER): Payer: Self-pay | Admitting: Internal Medicine

## 2010-11-16 ENCOUNTER — Emergency Department (HOSPITAL_BASED_OUTPATIENT_CLINIC_OR_DEPARTMENT_OTHER)
Admission: RE | Admit: 2010-11-16 | Disposition: A | Discharge status: Home / Self Care | Payer: Self-pay | Source: Emergency Department | Attending: Emergency Medicine | Admitting: Emergency Medicine

## 2010-11-16 ENCOUNTER — Encounter (HOSPITAL_BASED_OUTPATIENT_CLINIC_OR_DEPARTMENT_OTHER): Payer: Self-pay

## 2010-11-16 LAB — CBC, PLATELET & DIFFERENTIAL
BASOPHIL %: 0.2 % (ref 0.0–2.0)
EOSINOPHIL %: 0 % (ref 0.0–7.0)
HEMATOCRIT: 38.4 % (ref 36.0–48.0)
HEMOGLOBIN: 13.3 g/dl (ref 12.0–16.0)
LYMPHOCYTE %: 10.7 % — ABNORMAL LOW (ref 13.0–39.0)
MEAN CORP HGB CONC: 34.7 g/dl (ref 32.0–36.0)
MEAN CORPUSCULAR HGB: 30.6 pg (ref 27.0–33.0)
MEAN CORPUSCULAR VOL: 88.3 fl (ref 80.0–100.0)
MEAN PLATELET VOLUME: 8.7 fl (ref 6.4–10.8)
MONOCYTE %: 4.6 % (ref 1.0–12.0)
NEUTROPHIL %: 84.5 % — ABNORMAL HIGH (ref 46.0–79.0)
PLATELET COUNT: 201 10*3/uL (ref 150–400)
RBC DISTRIBUTION WIDTH: 12.1 % (ref 11.5–14.3)
RED BLOOD CELL COUNT: 4.35 M/uL — ABNORMAL LOW (ref 4.50–5.10)
WHITE BLOOD CELL COUNT: 13.4 10*3/uL — ABNORMAL HIGH (ref 4.0–10.8)

## 2010-11-16 LAB — BASIC METABOLIC PANEL
ANION GAP: 10 mmol/L (ref 2–25)
BUN (UREA NITROGEN): 16 mg/dl (ref 6–20)
CALCIUM: 8.7 mg/dl (ref 8.6–10.3)
CARBON DIOXIDE: 29 mmol/L (ref 22–32)
CHLORIDE: 96 mmol/L — ABNORMAL LOW (ref 101–111)
CREATININE: 1 mg/dl (ref 0.4–1.2)
ESTIMATED GLOMERULAR FILT RATE: >60 mL/min (ref >60)
Glucose Random: 168 mg/dl — ABNORMAL HIGH (ref 74–160)
POTASSIUM: 3.2 mmol/L — ABNORMAL LOW (ref 3.5–5.1)
SODIUM: 135 mmol/L (ref 135–144)

## 2010-11-16 MED ORDER — LORAZEPAM 2 MG/ML IJ SOLN
1.00 mg | Freq: Once | INTRAMUSCULAR | Status: AC
Start: 2010-11-16 — End: 2010-11-16
  Administered 2010-11-16: 1 mg via INTRAVENOUS
  Filled 2010-11-16: qty 1

## 2010-11-16 MED ORDER — IPRATROPIUM-ALBUTEROL 0.5-2.5 (3) MG/3ML IN SOLN
3.0000 mL | RESPIRATORY_TRACT | Status: AC
Start: 2010-11-16 — End: 2010-11-16
  Administered 2010-11-16 (×3): 3 mL via RESPIRATORY_TRACT
  Filled 2010-11-16: qty 3

## 2010-11-16 MED ORDER — IPRATROPIUM-ALBUTEROL 0.5-2.5 (3) MG/3ML IN SOLN
RESPIRATORY_TRACT | Status: AC
Start: 2010-11-16 — End: 2010-11-16
  Administered 2010-11-16: 3 mL via RESPIRATORY_TRACT
  Filled 2010-11-16: qty 6

## 2010-11-16 MED ORDER — SODIUM CHLORIDE 0.9 % IV BOLUS
1000.00 mL | Freq: Once | INTRAVENOUS | Status: AC
Start: 2010-11-16 — End: 2010-11-16
  Administered 2010-11-16: 1000 mL via INTRAVENOUS

## 2010-11-16 MED ORDER — POTASSIUM CHLORIDE 20 MEQ OR TBCR
40.00 meq | EXTENDED_RELEASE_TABLET | Freq: Once | ORAL | Status: AC
Start: 2010-11-16 — End: 2010-11-16
  Administered 2010-11-16: 40 meq via ORAL
  Filled 2010-11-16: qty 2

## 2010-11-16 MED ORDER — METHYLPREDNISOLONE SODIUM SUCC 40 MG IJ SOLR
40.00 mg | Freq: Once | INTRAMUSCULAR | Status: AC
Start: 2010-11-16 — End: 2010-11-16
  Administered 2010-11-16: 40 mg via INTRAVENOUS
  Filled 2010-11-16: qty 40

## 2010-11-16 NOTE — ED Provider Notes (Signed)
I have reviewed the ED nursing notes and prior records. I have reviewed the patient's past medical history/problem list, allergies, social history and medication list.  I saw this patient primarily.    HPI:  This 40 year old female patient presents with chief complaint of several hours of SOB and wheezing, rapidly worsened and now severe.  Pt has had several months in which her asthma is markedly worse than it has been; she has been attributing it to some bleach exposure.  She has been on Prednisone 50mg  daily for some time, and has had frequent exacerbations requiring ED visits, and reports she improves when given Solumedrol.  Pt has been under a lot of stress at work and had an "incident" with someone else tonight, and became very anxious.  No fever/chills.  No CP, no cough.    ROS: Pertinent positives were reviewed as per the HPI above. All other systems were reviewed and are negative.    Past Medical History/Problem List:    Past Medical History    Hypertension     Hypothyroidism     Anxiety     Depression     Comment: no manic, no suicide attempts, on wellbutrin for years    Asthma     Comment: triggers = smoke, cold air, hospitalized several times, never intubated (refused once)     Endometriosis     Comment: resolved since TAH    Rosacea        Patient Active Problem List:     Asthma [493.90T]     Contusion, foot [924.20B]     Neuritis [729.68F]     RSD (reflex sympathetic dystrophy) [337.20L]     Hypertension [401.9AH]     Hypothyroidism [244.9Y]     Right shoulder pain [719.41X]      Past Surgical History:    Past Surgical History    TAH +-RMVL TUBE +-RMVL OVARY     Comment just TAH no ovary removal, 2005; laparascopies x 2 before TAH for endometriosis    ACL REPAIR     Comment left 2004, cadavaric acl    OB ANTEPARTUM CARE C DLVR&POSTPARTUM     Comment x2, 97 and 98    ROTATOR CUFF REPAIR     Comment Labral repair anchor not sure if plastic or metal but had MRI with this in place without problems, on right  shoulder         Medications:     No current facility-administered medications on file prior to encounter.  Current outpatient prescriptions ordered prior to encounter:  lidocaine (XYLOCAINE) 2 % solution Take 5 mLs by mouth as needed for Pain. Disp: 20 mL Rfl: 0   ipratropium-albuterol (DUO-NEB) 0.5-2.5 (3) MG/3ML SOLN Inhalation Solution Take 3 mLs by nebulization 4 (four) times daily as needed. Disp: 1000 mL Rfl: 11   nystatin (MYCOSTATIN) 100000 UNIT/ML suspension Take 5 mLs by mouth 4 (four) times daily. Disp: 280 mL Rfl: 0   lorazepam (ATIVAN) 1 MG tablet Take 1 tablet by mouth every 8 (eight) hours as needed for Anxiety. Disp: 30 tablet Rfl: 1   predniSONE (DELTASONE) 20 MG tablet Take 1 tablet by mouth daily. Disp: 30 tablet Rfl: 0   furosemide (LASIX) 20 MG tablet Take 1 tablet by mouth 2 (two) times daily. Disp: 60 tablet Rfl: 1   potassium chloride (KLOR-CON) 10 MEQ CR tablet Take 1 tablet by mouth daily. Disp: 30 tablet Rfl: 0   VENTOLIN HFA 108 (90 BASE) MCG/ACT inhaler Inhale  2 puffs into the lungs every 4 (four) hours as needed for Wheezing. NO SUBSTITUTION Disp: 2 Inhaler Rfl: 0   fluticasone-salmeterol (ADVAIR DISKUS) 500-50 MCG/DOSE AEPB Aerosol Powder Inhale 1 puff into the lungs every 12 (twelve) hours. Disp: 180 each Rfl: 4   albuterol (PROVENTIL) (2.5 MG/3ML) 0.083% nebulizer solution Take 3 mLs by nebulization every 6 (six) hours as needed for Wheezing. Disp: 75 mL Rfl: 4   hydrOXYzine (ATARAX) 25 MG tablet Take 1 tablet by mouth every 8 (eight) hours as needed for Itching and Anxiety. Disp: 30 tablet Rfl: 0   Spacer/Aero-Holding Chambers (AEROCHAMBER MAX W/MASK MEDIUM) device Use as instructed Disp: 1 Device Rfl: 0   benzonatate (TESSALON) 100 MG capsule Take 1 capsule by mouth 3 (three) times daily as needed for Cough. Disp: 42 capsule Rfl: 0   Nebulizer MISC 1 kit by Does not apply route as needed. Disp: 1 each Rfl: 0   predniSONE (DELTASONE) 5 MG tablet Take 1 tablet by mouth 3 (three)  times daily. Three times per day Disp: 90 tablet Rfl: 1   zolpidem (AMBIEN CR) 12.5 MG CR tablet Take 1 tablet by mouth nightly as needed for Sleep. Disp: 30 tablet Rfl: 0   SYNTHROID 125 MCG tablet Take 1 tablet by mouth every morning. Disp: 90 tablet Rfl: 4   hydrochlorothiazide (HYDRODIURIL) 25 MG tablet Take 1 tablet by mouth daily. Disp: 90 tablet Rfl: 4   buPROPion (WELLBUTRIN XL) 300 MG 24 hr tablet Take 1 tablet by mouth daily. Disp: 90 tablet Rfl: 4   amlodipine (NORVASC) 5 MG tablet Take 1 tablet by mouth daily. Disp: 90 tablet Rfl: 4   EPINEPHrine (EPIPEN 2-PAK) 0.3 MG/0.3ML Injection Device Inject 0.3 mg into the muscle once as needed. Disp: 1 each Rfl: 1   clindamycin-benzoyl peroxide (BENZACLIN) gel Apply  topically 2 (two) times daily. Disp: 50 g Rfl: 4   lidocaine (LIDODERM) 5 % patch Place 1 patch onto the skin daily. Disp: 90 patch Rfl: 4         Social History:     Smoking status: Never Smoker     Smokeless tobacco:     Alcohol Use: No       Allergies:  Review of Patient's Allergies indicates:   Penicillins             Hives   Oxycodone                   Comment:Hives, other narcotics OK   Erythromycin            Nausea Only    Comment:Stomach pain severe no nausea, can take             azithromycin    Physical Exam:  BP 143/104   Pulse 127   Temp 98.1 F (36.7 C)   Resp 20   SpO2 100%  GENERAL:  Mild distress, anxious.  SKIN:  Warm & Dry, no rash, no bruising.  HEAD:  Atraumatic. PERRL. EOMI. Oropharynx clear.  NECK:  Supple, no midline tenderness, no LAD.  LUNGS:  Tachypnic, diffuse exp wheeze, decreased air mvt.   HEART:  Tachy..  No murmurs, rubs, or gallops.   ABDOMEN:  Soft, flat, without distension.  Nontender to palpation.   MUSCULOSKELETAL:  No deformities.    GENITOURINARY:  No CVA tenderness.   NEUROLOGIC:  Normal speech.  alert & oriented x 3, CNsII-XII intact. Normal gait. DTRs symmetric.  PSYCHIATRIC: Anxious    ED  Course and Medical Decision-making:    The patient is 40 year old  female with apparent asthma exacerbation and anxiety.  She requests Solumedrol despite discussion about longstanding heavy steroid use.  She does agree to Ativan.    EKG: ST at 121.  Nl axis and intervals.  No acute QRS/ST/T changes.    11:04 PM  Pt has had three duonebs, ativan 1mg  IV, and requested solumedrol (given 40mg ).  Still sl tachypnic, but lungs nearly CTA and POx 99 on RA.  Pt requesting to go home, despite offer to keep pt on monitor for a time.  She reports she has an appt with Dr Kem Kays (pulmonary) tomorrow.    Will replete K prior to discharge.        Reasons to return to the ED were reviewed in detail. The patient agrees with this plan and disposition.    Disposition:  Discharged to home    Condition on  Discharge:  Improved and Stable      Diagnosis/Diagnoses:  493.72M Asthma exacerbation  300.00E Anxiety      Everlene Farrier, MD

## 2010-11-16 NOTE — Telephone Encounter (Signed)
Call back to pharmacist, Lelon Mast, regarding urgent medication request for Danette. They were talking on the phone today and patient endorsed feeling suicidal.     Waking up with shortness of breath, waking up in the middle of the night, taking duonebs and albuterol nebs - not helping.   Taking Advair twice daily.   Anxious, agitated, having trouble functioning. Has been on prednisone since May 2  Crying all the time and having trouble dealing with any pressure or stress. Feeling very overwhelmed.   Denies suicidal or homicidal ideation.     Taking prednisone 50 mg as advised by ED doctor. Advised her to decrease back to 30 mg - she already took 50 mg today. Will hold dose tomorrow am until sees Dr. Kem Kays.     Advised she needs to go to ED if feeling suicidal or homicidal and patient agrees with plan.

## 2010-11-16 NOTE — ED Notes (Signed)
Patient K;3.2 informed to Dr. Arnold Long, given 40 mEq PO.

## 2010-11-16 NOTE — Discharge Instructions (Signed)
Index   Anxiety   What is anxiety?   Anxiety is feeling uneasy, tense or apprehensive in response to stressful or threatening circumstances. Some people feel more anxiety than others. Some people feel more anxiety than others.   Anxiety is a normal reaction to stress. At times it may actually help you deal with tense situations. It may help you be more alert or careful. But when anxiety becomes an excessive, irrational dread of everyday situations, it is a disabling disorder. Examples of anxiety disorders are panic disorder, obsessive-compulsive disorder (OCD), and post-traumatic stress disorder (PTSD).   How does it occur?   The exact cause of anxiety is not known. The brain is made up of billions of neurons (cells) that communicate with each other. This affects other parts of the body. Neurotransmitters are chemical substances in the brain. The kinds and amounts of these substances control how neurons communicate. Too much or too little of these neurotransmitters may lead to anxiety.   Anxiety problems tend to run in families. Stressful life events and situations also play a major part. Anxiety is more common if you have few friends, family, and activities. Poor diet and lack of daily exercise may also make anxiety disorders more likely.   Anxiety can be brought on by:   · alcohol   · amphetamines   · cocaine   · caffeine   · some antidepressants   · steroids   · withdrawal from certain sedatives   Medical conditions can also cause anxiety. Heart problems, breathing problems, lack of vitamins, or blood sugar or thyroid problems can cause anxiety symptoms. For this reason, it is always important to discuss any long-term anxiety with your healthcare provider.   What are the symptoms?   The signs and symptoms of anxiety may be both psychological and physical. The symptoms can be mild, or they may be so intense that you feel panic.   Psychological symptoms include:   · apprehension or fear   · feeling cranky or  irritable   · panic   · impatience   · feeling of imminent danger   · feeling restless or unable to relax   · trouble concentrating   · trouble sleeping   · lack of enjoyment.   Physical signs and symptoms include:   · dry mouth or feeling like you are choking   · flushing   · nausea or vomiting   · feeling faint, lightheaded, or shaky   · diarrhea   · constipation   · muscle tension   · frequent urination   · hyperactivity   · sexual difficulties   · rapid or irregular heartbeat   · hyperventilating or feeling short of breath   · sweating, especially in the palms.   How is it diagnosed?   Your healthcare provider will ask about your symptoms. He or she will ask you about life events, daily activities, and your view of how things are going.   Your healthcare provider will also examine you. Lab tests may be done to rule out a physical problem as the reason for your anxiety. Possible tests include a blood tests, thyroid function tests, and urine tests.   How is it treated?   You and your healthcare provider will discuss your symptoms. Then he or she will suggest ways to help you deal with anxiety. Your provider may refer you to a mental health professional. Relaxation therapy, imaging, biofeedback, stress management techniques, and other forms of therapy may   be useful.   If your anxiety is severe or causing panic, your healthcare provider may prescribe a medicine to help you cope with the symptoms. Several medicines can help treat anxiety. Your healthcare provider will work with you to carefully select the best one for you.   To decrease anxiety, it is important to identify and use methods that relieve your symptoms. Your healthcare provider may want to see you regularly if your anxiety attacks include physical symptoms.   How can I take care of myself?   Be aware of how anxiety and stress affect you and learn ways to cope that work for you. Also, get enough rest, exercise, and learn to use relaxation techniques. Talk  with your healthcare provider or therapist about managing events in your life that trigger anxiety. In addition, make an effort to talk with friends and coworkers about the normal stresses of daily life.   Take antianxiety medicines exactly as your healthcare provider prescribes. Do not take more than prescribed. Do not stop taking the medicine without your healthcare provider's approval. You may have to reduce your dosage gradually. This helps to prevent withdrawal symptoms.   What can I do to help prevent anxiety?   Anxiety may occur when life's demands are greater than your ability to cope with them. Therefore, prevention means improving your coping skills or modifying the demands and expectations in your life. To prevent anxiety, try these suggestions:    Exercise for at least 20 minutes every day. For example, take a brisk walk.    Learn which activities make you feel better and do them often.    Talk to your family and friends.    Eat a healthy diet.    Avoid caffeine.    Get 7 to 9 hours of sleep per night.    Keep a regular schedule for going to sleep and getting up.    Avoid using alcohol or drugs.    Learn relaxation techniques or yoga.   Developed by Thana Ates Excell Seltzer, RN, [place], and [employer].   Published by [employer].   Last modified: 2008-07-09   Last reviewed: 2008-03-23   This content is reviewed periodically and is subject to change as new health information becomes available. The information is intended to inform and educate and is not a replacement for medical evaluation, advice, diagnosis or treatment by a healthcare professional.   Behavioral Health Advisor 2010.1 Adult Topics Index   2010 [employer] and/or its affiliates. All Rights Reserved.         Index   Relaxation Techniques   What are relaxation techniques?   Relaxation techniques are ways of quieting the body and calming the mind. They help you deal with stress, anxiety, and the pressures of everyday life.   What kinds of  people need relaxation techniques?   Anyone can use relaxation techniques when coping with stress. Relaxation techniques are especially important if you are hard-driving, tend to do too much, do not take lunch breaks, have several things to do and not enough time to do them, and cannot stand failure. You may become impatient in traffic jams or when waiting in line, often try harder than others, and get angry at others easily.   What are signs that relaxation techniques would be helpful?   A pounding heart, dry throat and mouth, and sweating may be warning signs of stress when they occur without a physical cause (such as exercise). Other signs of stress include:    general irritability  or annoyance    difficulty concentrating    an urge to run or hide    trembling    feeling "keyed up"    not sleeping or sleeping too much    being easily startled by small sounds    feeling pressure from inside yourself to be constantly productive    feeling out of control   What can I do to relax?   All methods of relaxing have one or more of the following four components:    relaxing the major muscle groups of the body    slowing down breathing    imagining a pleasant scene    creating repetitive thoughts or motions.   Deep muscle relaxation has been shown to help reduce tension headaches, as well as jaw, neck, and low back pain. Controlled breathing reduces anxiety, and can help you to go back to the issue at hand with a more relaxed and open attitude. Relaxation techniques require practice; allow at least 20 minutes a day. To practice progressive muscle relaxation:    Make sure your bladder is empty.    Sit in a quiet place in a relaxed position with both feet on the floor.    Close your eyes. Relax your arms and hands in your lap.    Start to slow down your breathing, being aware of each breath and the air as it comes in and goes out of your lungs.    Starting with the muscles of your feet, take time to relax  each muscle group of your body. Working upward, relax the muscles of your legs, bottom, stomach, lower back, arms and shoulders, neck, head, and face. Do this slowly, while breathing more and more slowly.    If any area of your body (such as your shoulders or your stomach or forehead) is apt to be tight and tense, pay special attention to relaxing it.    Say a single, relaxing word to yourself over and over again, like "relax" or "quiet."    When all the muscles of your body are relaxed, survey your body for any tension and imagine yourself breathing the tension out.    Imagine yourself floating down an escalator, and reaching deeper and deeper levels of relaxation as you go down.    At the bottom, imagine yourself in any safe, quiet place you choose, perhaps a beach with palm trees or a mountain meadow. The important thing is that it is a comfortable place for you.    Imagine all the details of your comfortable space while you are breathing slowly and with your muscles fully relaxed.    Stay there as long as you want and when you are ready, come back slowly, gradually opening your eyes, stretching, and starting to move.   What are some coping strategies for dealing with stress?    Practice a short form of the above relaxation technique, taking a few seconds or minutes to relax several times a day.    Carry a card with these words on it, and look at it several times a day: "I am calm and relaxed."    Take deep slow breaths often, while on the phone, in the car, or waiting for someone.    Say "no" when asked to do something you do not want to do.    Take time to be with nature, music, and children.    Make sure there are empty spaces in your life where nothing is demanded of  you.    Exercise regularly.    Get at least 7 to 8 hours sleep each night.    Limit caffeine, alcohol, and sugar in your diet.   If you have serious problems from muscle tension, see your healthcare provider. Your provider may  treat you or refer you to a physical therapist or physiologist.   If you would like to learn more about relaxation techniques, check your local community college or community center. They may offer classes in these and other relaxation techniques.   Written by Tomma Rakers, MSW.   Published by [employer].   Last modified: 2007-05-22   Last reviewed: 2007-05-22   This content is reviewed periodically and is subject to change as new health information becomes available. The information is intended to inform and educate and is not a replacement for medical evaluation, advice, diagnosis or treatment by a healthcare professional.   Behavioral Health Advisor 2010.1 Adult Topics Index   2010 [employer] and/or its affiliates. All Rights Reserved.        Index   Relaxation Techniques   What are relaxation techniques?   Relaxation techniques are ways of quieting the body and calming the mind. They help you deal with stress, anxiety, and the pressures of everyday life.   What kinds of people need relaxation techniques?   Anyone can use relaxation techniques when coping with stress. Relaxation techniques are especially important if you are hard-driving, tend to do too much, do not take lunch breaks, have several things to do and not enough time to do them, and cannot stand failure. You may become impatient in traffic jams or when waiting in line, often try harder than others, and get angry at others easily.   What are signs that relaxation techniques would be helpful?   A pounding heart, dry throat and mouth, and sweating may be warning signs of stress when they occur without a physical cause (such as exercise). Other signs of stress include:    general irritability or annoyance    difficulty concentrating    an urge to run or hide    trembling    feeling "keyed up"    not sleeping or sleeping too much    being easily startled by small sounds    feeling pressure from inside yourself to be constantly productive     feeling out of control   What can I do to relax?   All methods of relaxing have one or more of the following four components:    relaxing the major muscle groups of the body    slowing down breathing    imagining a pleasant scene    creating repetitive thoughts or motions.   Deep muscle relaxation has been shown to help reduce tension headaches, as well as jaw, neck, and low back pain. Controlled breathing reduces anxiety, and can help you to go back to the issue at hand with a more relaxed and open attitude. Relaxation techniques require practice; allow at least 20 minutes a day. To practice progressive muscle relaxation:    Make sure your bladder is empty.    Sit in a quiet place in a relaxed position with both feet on the floor.    Close your eyes. Relax your arms and hands in your lap.    Start to slow down your breathing, being aware of each breath and the air as it comes in and goes out of your lungs.    Starting with the muscles of your  feet, take time to relax each muscle group of your body. Working upward, relax the muscles of your legs, bottom, stomach, lower back, arms and shoulders, neck, head, and face. Do this slowly, while breathing more and more slowly.    If any area of your body (such as your shoulders or your stomach or forehead) is apt to be tight and tense, pay special attention to relaxing it.    Say a single, relaxing word to yourself over and over again, like "relax" or "quiet."    When all the muscles of your body are relaxed, survey your body for any tension and imagine yourself breathing the tension out.    Imagine yourself floating down an escalator, and reaching deeper and deeper levels of relaxation as you go down.    At the bottom, imagine yourself in any safe, quiet place you choose, perhaps a beach with palm trees or a mountain meadow. The important thing is that it is a comfortable place for you.    Imagine all the details of your comfortable space while you are  breathing slowly and with your muscles fully relaxed.    Stay there as long as you want and when you are ready, come back slowly, gradually opening your eyes, stretching, and starting to move.   What are some coping strategies for dealing with stress?    Practice a short form of the above relaxation technique, taking a few seconds or minutes to relax several times a day.    Carry a card with these words on it, and look at it several times a day: "I am calm and relaxed."    Take deep slow breaths often, while on the phone, in the car, or waiting for someone.    Say "no" when asked to do something you do not want to do.    Take time to be with nature, music, and children.    Make sure there are empty spaces in your life where nothing is demanded of you.    Exercise regularly.    Get at least 7 to 8 hours sleep each night.    Limit caffeine, alcohol, and sugar in your diet.   If you have serious problems from muscle tension, see your healthcare provider. Your provider may treat you or refer you to a physical therapist or physiologist.   If you would like to learn more about relaxation techniques, check your local community college or community center. They may offer classes in these and other relaxation techniques.   Written by Tomma Rakers, MSW.   Published by [employer].   Last modified: 2007-05-22   Last reviewed: 2007-05-22   This content is reviewed periodically and is subject to change as new health information becomes available. The information is intended to inform and educate and is not a replacement for medical evaluation, advice, diagnosis or treatment by a healthcare professional.   Behavioral Health Advisor 2010.1 Adult Topics Index   2010 [employer] and/or its affiliates. All Rights Reserved.       Follow up with Dr Kem Kays tomorrow as scheduled.  Return to the ER with any worsening symptoms.

## 2010-11-16 NOTE — ED Triage Note (Signed)
P/W SOB R/T asthma.  Was at work and had verbal confrontation with [employer]in crew and began wheezing.  Pt has been on prednisone for several weeks and was recently seen in this ER for same complaint.

## 2010-11-16 NOTE — ED Notes (Signed)
Patient Disposition    Patient education for diagnosis, medications, activity, diet and follow-up.  Patient left ED 11:15 PM.  Patient rep received written instructions.  Interpreter to provide instructions: no    Discharged to: Discharged to home

## 2010-11-17 ENCOUNTER — Encounter (HOSPITAL_BASED_OUTPATIENT_CLINIC_OR_DEPARTMENT_OTHER): Payer: Self-pay | Admitting: Pulmonary

## 2010-11-17 ENCOUNTER — Ambulatory Visit (HOSPITAL_BASED_OUTPATIENT_CLINIC_OR_DEPARTMENT_OTHER): Payer: 59 | Admitting: Pulmonary

## 2010-11-17 ENCOUNTER — Encounter (HOSPITAL_BASED_OUTPATIENT_CLINIC_OR_DEPARTMENT_OTHER): Payer: Self-pay | Admitting: Internal Medicine

## 2010-11-17 VITALS — BP 112/64 | HR 110 | Resp 18 | Ht 64.0 in | Wt 146.0 lb

## 2010-11-17 DIAGNOSIS — F419 Anxiety disorder, unspecified: Secondary | ICD-10-CM

## 2010-11-17 DIAGNOSIS — R059 Cough, unspecified: Secondary | ICD-10-CM

## 2010-11-17 DIAGNOSIS — J383 Other diseases of vocal cords: Secondary | ICD-10-CM

## 2010-11-17 DIAGNOSIS — J45909 Unspecified asthma, uncomplicated: Secondary | ICD-10-CM

## 2010-11-17 DIAGNOSIS — R05 Cough: Secondary | ICD-10-CM

## 2010-11-17 NOTE — Progress Notes (Signed)
Primary note dictated through E-Scription.  Note filed in Chart Review under the "Other" Tab, as a Medical Specialties Note.

## 2010-11-17 NOTE — Progress Notes (Signed)
New pt   H/o asthma since childhood   Pt states that she has been having an exacerbation  Of her asthma since 10/29/2010  When she was exposed to bleach  At  work    Pt has been having a problem since that time   . Pt has a peak flow meter  And has been doing it 4 x daily    Range 300- 400  Pt states that her best peak flow when not having a problem is   570.    Pt needs further evaluation and possible treatment    Lives with family    Safe at home

## 2010-11-18 ENCOUNTER — Telehealth (HOSPITAL_BASED_OUTPATIENT_CLINIC_OR_DEPARTMENT_OTHER): Payer: Self-pay | Admitting: Registered Nurse

## 2010-11-18 ENCOUNTER — Encounter (HOSPITAL_BASED_OUTPATIENT_CLINIC_OR_DEPARTMENT_OTHER): Payer: Self-pay | Admitting: Internal Medicine

## 2010-11-18 ENCOUNTER — Encounter (HOSPITAL_BASED_OUTPATIENT_CLINIC_OR_DEPARTMENT_OTHER): Payer: Self-pay | Admitting: Registered Nurse

## 2010-11-18 LAB — EKG

## 2010-11-18 NOTE — Telephone Encounter (Signed)
Message copied by Olevia Bowens on Wed Nov 18, 2010 12:44 PM  ------       Message from: AMADO-LOUIS, LISA       Created: Wed Nov 18, 2010 12:13 PM       Regarding: sick                Call back #[phone]              Interpreter:              Person Calling:self via [facility]              Reason for todays call?:                      Sick call:asthma              Symptoms:              How long have you been sick?:                             Reason to speak with a nurse only?              Reason to speak with a provider only?                            Other call: Reason for call?
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
## 2010-11-18 NOTE — Telephone Encounter (Signed)
Emailed pt again

## 2010-11-18 NOTE — Telephone Encounter (Signed)
Call to pt for further info for request of appt this week with PCP (sent via MyChart).  Left message to return RN call when able.

## 2010-11-19 ENCOUNTER — Encounter (HOSPITAL_BASED_OUTPATIENT_CLINIC_OR_DEPARTMENT_OTHER): Payer: Self-pay | Admitting: Internal Medicine

## 2010-11-19 ENCOUNTER — Telehealth (HOSPITAL_BASED_OUTPATIENT_CLINIC_OR_DEPARTMENT_OTHER): Payer: Self-pay | Admitting: Otolaryngology

## 2010-11-19 ENCOUNTER — Encounter (HOSPITAL_BASED_OUTPATIENT_CLINIC_OR_DEPARTMENT_OTHER): Payer: Self-pay | Admitting: Pulmonary

## 2010-11-19 ENCOUNTER — Ambulatory Visit (HOSPITAL_BASED_OUTPATIENT_CLINIC_OR_DEPARTMENT_OTHER): Payer: PRIVATE HEALTH INSURANCE | Admitting: Internal Medicine

## 2010-11-19 VITALS — BP 106/68 | HR 113 | Temp 98.7°F | Resp 12

## 2010-11-19 DIAGNOSIS — R059 Cough, unspecified: Secondary | ICD-10-CM

## 2010-11-19 DIAGNOSIS — J383 Other diseases of vocal cords: Secondary | ICD-10-CM

## 2010-11-19 DIAGNOSIS — R05 Cough: Secondary | ICD-10-CM

## 2010-11-19 DIAGNOSIS — J45909 Unspecified asthma, uncomplicated: Secondary | ICD-10-CM

## 2010-11-19 DIAGNOSIS — F419 Anxiety disorder, unspecified: Secondary | ICD-10-CM

## 2010-11-19 MED ORDER — CLONAZEPAM 0.5 MG PO TABS
0.5000 mg | ORAL_TABLET | Freq: Three times a day (TID) | ORAL | Status: DC | PRN
Start: 2010-11-19 — End: 2010-11-24

## 2010-11-19 MED ORDER — BENZONATATE 200 MG PO CAPS
200.00 mg | ORAL_CAPSULE | Freq: Three times a day (TID) | ORAL | Status: AC | PRN
Start: 2010-11-19 — End: 2010-12-03

## 2010-11-19 NOTE — Telephone Encounter (Signed)
Spoke to patient about date chg from July  To June 20th at  11:30

## 2010-11-19 NOTE — Progress Notes (Signed)
.    Lives with family and feels safe at home. LDG
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
## 2010-11-20 ENCOUNTER — Telehealth (HOSPITAL_BASED_OUTPATIENT_CLINIC_OR_DEPARTMENT_OTHER): Payer: Self-pay | Admitting: Registered Nurse

## 2010-11-20 NOTE — Telephone Encounter (Signed)
Spoke with patient she has recently taken a neb treatment and still reports she does not feel well.  She said she did not sleep well is on a large dose of prenisone and it is making her more anxious she did want to speak to the MD I did page Dr. Homero Fellers and she did call back but I had already encouraged the patient to go to the ED .  She indicated she would call 911 and go to the ed.  Dr. Homero Fellers did call back and she agreed with my plan to send the patient to the ed.

## 2010-11-20 NOTE — Telephone Encounter (Signed)
Message copied by Ulla Potash on Fri Nov 20, 2010 11:03 AM  ------       Message from: Natalia Leatherwood       Created: Fri Nov 20, 2010 10:49 AM       Regarding: anxiety asthma attack                 [facility]              Madison Mckay [id number], 40 year old, female, Telephone Information:       Home Phone      [phone]       Work Phone      Not on file.       Mobile          [phone]                     [name] NUMBER: [phone]       Best time to call back: anytime        Cell phone:        Other phone:              Available times:              Patient's language of care: English              Patient does not need an interpreter.              Patient's PCP: Mitchell Heir, MD              Person calling on behalf of patient: Patient (self)              Calls today with a sick call. Pt said she experiencing an anxiety asthma attack at the moment would like to speak with RN, attack started all night last nite, pt had taken the prescribe medication:clonazepam (KLONOPIN) 0.5 MG tab, and Nebulizer MISC however pt said med is not working for her,         would like to speak directly with a RN              pls advise        Patient's Preferred Pharmacy:        [facility] [address] [facility] [id number] [place]              Phone: [phone] Fax: [phone]

## 2010-11-20 NOTE — Telephone Encounter (Signed)
Message copied by Blanchard Kelch on Fri Nov 20, 2010 11:08 AM  ------       Message from: Natalia Leatherwood       Created: Fri Nov 20, 2010 10:49 AM       Regarding: anxiety asthma attack                 [facility]              Madison Mckay [id number], 40 year old, female, Telephone Information:       Home Phone      [phone]       Work Phone      Not on file.       Mobile          [phone]                     [name] NUMBER: [phone]       Best time to call back: anytime        Cell phone:        Other phone:              Available times:              Patient's language of care: English              Patient does not need an interpreter.              Patient's PCP: Mitchell Heir, MD              Person calling on behalf of patient: Patient (self)              Calls today with a sick call. Pt said she experiencing an anxiety asthma attack at the moment would like to speak with RN, attack started all night last nite, pt had taken the prescribe medication:clonazepam (KLONOPIN) 0.5 MG tab, and Nebulizer MISC however pt said med is not working for her,         would like to speak directly with a RN              pls advise        Patient's [facility]erred [facility]acy:        [facility] [address] [facility] [id number] [place]              Phone: [phone] Fax: [phone]

## 2010-11-23 ENCOUNTER — Telehealth (HOSPITAL_BASED_OUTPATIENT_CLINIC_OR_DEPARTMENT_OTHER): Payer: Self-pay | Admitting: Registered Nurse

## 2010-11-23 ENCOUNTER — Telehealth (HOSPITAL_BASED_OUTPATIENT_CLINIC_OR_DEPARTMENT_OTHER): Payer: Self-pay

## 2010-11-23 LAB — ALLERGENS ZONE 1
D001 D PTERONYSSINUS: <0.08 kU/L
D002 D FARINAE MITE: <0.08 kU/L
E001 CAT DANDER: <0.08 kU/L
E002 DOG EPITHELIA: <0.08 kU/L
G002 BERMUDA GRASS: <0.08 kU/L
G008 BLUEGRASS KENTUCKY: <0.08 kU/L
G017 BAHIA GRASS: <0.08 kU/L
I100 COCKROACH AMERICAN: <0.08 kU/L
M001 PENICILLIUM NOTATUM: <0.08 kU/L
M002 CLADOSPORIUM HERBARUM: <0.08 kU/L
M003 ASPERGILLUS FUMIGATUS: <0.08 kU/L
M004 MUCOR RACEMOSUS: <0.08 kU/L
M006 ALTERNARIA TENUIS: <0.08 kU/L
M010 STEMPHYLIUM BOTRYOSUM: <0.08 kU/L
T001 MAPLE/BOX ELDER: <0.08 kU/L
T006 CEDAR MOUNTAIN: <0.08 kU/L
T007 OAK WHITE: <0.08 kU/L
T008 ELM AMERICAN WHITE: <0.08 kU/L
T015 ASH WHITE: <0.08 kU/L
T030 BIRCH WHITE: <0.08 kU/L
T040 HAZELNUT TREE: <0.08 kU/L
T041 HICKORY WHITE: <0.08 kU/L
T070 WHITE MULBERRY: <0.08 kU/L
W001 RAGWEED SHORT/COMMON: <0.08 kU/L
W006 MUGWORT: <0.08 kU/L
W009 PLANTAIN ENGLISH: <0.08 kU/L
W014 PIGWEED ROUGH: <0.08 kU/L
W018 SHEEP SORREL: <0.08 kU/L
W020 NETTLE: <0.08 kU/L

## 2010-11-23 LAB — IMMUNOGLOBULIN E: IMMUNOGLOBULIN E: 7 IU/mL (ref 0–100)

## 2010-11-23 NOTE — Telephone Encounter (Signed)
SCW called pt to inform that she can apply for [employer] for herself and her child at the [facility], that is close to pt's home, and explained the documentation she will need to present to complete the application _ both Birth Certificates and SS#, a picture identification, proof-of-address.  SCW gave to pt her office phone number and told pt to call in case she encounter any type of problem when applying for [employer].

## 2010-11-23 NOTE — Telephone Encounter (Signed)
Message copied by Olevia Bowens on Mon Nov 23, 2010  3:55 PM  ------       Message from: AMADO-LOUIS, LISA       Created: Mon Nov 23, 2010  3:49 PM       Regarding: med questions         [facility]              Madison Mckay [id number], 40 year old, female, Telephone Information:       Home Phone      [phone]       Work Phone      Not on file.       Mobile          [phone]                     CALL BACK NUMBER: [phone]       Best time to call back:        Cell phone:        Other phone:              Available times:              Patient's language of care: English              Patient does not need an interpreter.              Patient's PCP: Mitchell Heir, MD              Person calling on behalf of patient: Patient (self)              Calls today to speak to nurse only.       with questions and concerns.meds               Patient's Preferred Pharmacy:        [facility] [id number] [place]              Phone: [phone] Fax: [phone]

## 2010-11-23 NOTE — Telephone Encounter (Signed)
Call to pt who was very tearful on phone.  "I was just fired from my job!"  Worried as has an autistic son who is on medicine and sees psychiatry regularly.  "I need help to apply for [employer] and get him covered asap since I don't have a job".  Will relay this info to our clinic social work team and notify PCP.      Madison Mckay:  Who would help this mom with the application process?  Very concerned about her young son who has autism and needs meds and sees psych regularly.  Please f/u with her.      [name]:  FYI

## 2010-11-24 ENCOUNTER — Telehealth (HOSPITAL_BASED_OUTPATIENT_CLINIC_OR_DEPARTMENT_OTHER): Payer: Self-pay | Admitting: Internal Medicine

## 2010-11-24 ENCOUNTER — Telehealth (HOSPITAL_BASED_OUTPATIENT_CLINIC_OR_DEPARTMENT_OTHER): Payer: Self-pay | Admitting: Registered Nurse

## 2010-11-24 ENCOUNTER — Ambulatory Visit (HOSPITAL_BASED_OUTPATIENT_CLINIC_OR_DEPARTMENT_OTHER): Payer: Self-pay | Admitting: Pulmonary

## 2010-11-24 DIAGNOSIS — F308 Other manic episodes: Secondary | ICD-10-CM

## 2010-11-24 MED ORDER — QUETIAPINE FUMARATE 25 MG PO TABS
25.0000 mg | ORAL_TABLET | ORAL | Status: DC
Start: 2010-11-24 — End: 2010-12-22

## 2010-11-24 NOTE — Telephone Encounter (Signed)
[facility], as they had patient insurance info on file  Will cover for $7.50    Called patient   [facility]  Agrees with plan

## 2010-11-24 NOTE — Telephone Encounter (Signed)
Discussed case with Dr. Doreatha Martin. Recommend quetiapine for insomnia due to prednisone. Will start at 50 mg qhs and may increase to 100 mg qhs. Also will give 25 mg bid prn anxiety to use during the day. If she still feels sweaty,shaky, etc on prednisone 30 mg - she should increase back to 40 mg and we will taper slowly over the next few weeks. Also should d/c clonazepam since has not been effective.

## 2010-11-24 NOTE — Telephone Encounter (Signed)
Spoke with patient  Has been prednisone since may 1st  Pulmonary wanted her to come off  Tapering off prednisone by 10mg  every 3 days  11/17/10 50mg  daily  11/18/10-11/20/10 40mg   11/21/10-11/23/10 30mg   11/24/10 20mg    Feels terrible, restless, sweating, shaking, hasn't slept in several days  Breathing is tight but manageable, Not in distress  Using duonebs 3-4 times daily  Would like to know if she can decrease by 5 mg instead  If they can do anything for the withdrawal symptoms  RN to notify provider

## 2010-11-24 NOTE — Telephone Encounter (Signed)
Voice mail to call clinic back

## 2010-11-24 NOTE — Telephone Encounter (Signed)
Message copied by Ulla Potash on Tue Nov 24, 2010  3:02 PM  ------       Message from: Natalia Leatherwood       Created: Tue Nov 24, 2010  1:39 PM       Regarding: med review (asthma pt          [facility]              Jacqulynn Shappell [id number], 40 year old, female, Telephone Information:       Home Phone      [phone]       Work Phone      Not on file.       Mobile          [phone]                     [name] NUMBER: [phone]       Best time to call back: anytime        Cell phone:        Other phone:              Available times:              Patient's language of care: English              Patient does not need an interpreter.              Patient's PCP: Mitchell Heir, MD              Person calling on behalf of patient: Patient (self)              Calls today with a sick call.pt would like to get direction on taking a medication that was prescribe to her, Med : predniSONE (DELTASONE) 20 MG tablet, however there have been some side effect, pt would like to know what to do? Was was told by provider Pulmonary  that she could taper the medication, changing the dosage, 20-20/30-30 etc, pt said she has being have different kind of side effect such has gastro intestine problem, nausea, restlessness, fainting, heavy breathing atimes while using her nebulizer, would like if RN could call her               pls call                      Patient's Preferred [facility]acy:        [facility] [id number] [place]              Phone: [phone] Fax: [phone]

## 2010-11-24 NOTE — Telephone Encounter (Signed)
Dr. Homero Fellers  After I hung up with the patient, I realize the directions of the Seroquel were not clear in EPIC.   So I called [facility] to clarify which she did.  However, the insurance will not cover it at all, will cost around $200.    Left a message for the patient that med may be costly  RN to notify provider

## 2010-11-24 NOTE — Telephone Encounter (Signed)
She should go back up to 30 mg and stay there. Taper is too rapid given her long term use of prednisone. Will discuss with endocrine and get back to her tomorrow. Please assess her mood.

## 2010-11-24 NOTE — Telephone Encounter (Signed)
Spoke with patient  Relayed message below  She will go back to 30mg  for now  Await a call back  Her mood is generally sad, she lost her job and is very worried  Mood in general the same as before the taper  Not sleeping for several days makes everything worse, feels so tired   Denies suicidal or homosidal ideations   RN to notify provider

## 2010-11-24 NOTE — Telephone Encounter (Signed)
Spoke with patient  Relayed message below  Verbalized understanding

## 2010-11-25 ENCOUNTER — Encounter (HOSPITAL_BASED_OUTPATIENT_CLINIC_OR_DEPARTMENT_OTHER): Payer: Self-pay | Admitting: Otolaryngology

## 2010-11-25 ENCOUNTER — Encounter (HOSPITAL_BASED_OUTPATIENT_CLINIC_OR_DEPARTMENT_OTHER): Payer: Self-pay | Admitting: Internal Medicine

## 2010-11-25 ENCOUNTER — Ambulatory Visit (HOSPITAL_BASED_OUTPATIENT_CLINIC_OR_DEPARTMENT_OTHER): Payer: 59 | Admitting: Podiatrist

## 2010-11-25 ENCOUNTER — Ambulatory Visit (HOSPITAL_BASED_OUTPATIENT_CLINIC_OR_DEPARTMENT_OTHER): Payer: 59 | Admitting: Otolaryngology

## 2010-11-25 VITALS — BP 121/86 | HR 98 | Temp 98.5°F

## 2010-11-25 DIAGNOSIS — M79673 Pain in unspecified foot: Secondary | ICD-10-CM

## 2010-11-25 DIAGNOSIS — R040 Epistaxis: Secondary | ICD-10-CM

## 2010-11-25 DIAGNOSIS — G9050 Complex regional pain syndrome I, unspecified: Secondary | ICD-10-CM

## 2010-11-25 DIAGNOSIS — S9030XA Contusion of unspecified foot, initial encounter: Secondary | ICD-10-CM

## 2010-11-25 DIAGNOSIS — R490 Dysphonia: Secondary | ICD-10-CM

## 2010-11-25 MED ORDER — MUPIROCIN 2 % EX OINT
TOPICAL_OINTMENT | Freq: Two times a day (BID) | CUTANEOUS | Status: AC
Start: 2010-11-25 — End: 2011-02-23
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
## 2010-11-26 ENCOUNTER — Telehealth (HOSPITAL_BASED_OUTPATIENT_CLINIC_OR_DEPARTMENT_OTHER): Payer: Self-pay | Admitting: Internal Medicine

## 2010-11-26 ENCOUNTER — Encounter (HOSPITAL_BASED_OUTPATIENT_CLINIC_OR_DEPARTMENT_OTHER): Payer: Self-pay | Admitting: Internal Medicine

## 2010-11-26 ENCOUNTER — Telehealth (HOSPITAL_BASED_OUTPATIENT_CLINIC_OR_DEPARTMENT_OTHER): Payer: Self-pay | Admitting: Registered Nurse

## 2010-11-26 NOTE — Telephone Encounter (Signed)
Called and left message for pt in regards to letter being ready for pick up or to be mailed, at pt's request. OME

## 2010-11-26 NOTE — Telephone Encounter (Signed)
From Lunette Tapp   Sent Thursday November 26, 2010 12:18 PM   Phone [phone]   Subject Non-Urgent Medical Question   Patient Madison Mckay [[id number]] (DOB: 1971-03-15)   Phone Entered Pt Home     [phone]    Message Please do not send anymore prescriptions to [facility].     Thank you.    [name]

## 2010-11-28 NOTE — Progress Notes (Signed)
This office note has been dictated. Account number [id number]

## 2010-12-01 ENCOUNTER — Ambulatory Visit (HOSPITAL_BASED_OUTPATIENT_CLINIC_OR_DEPARTMENT_OTHER): Payer: PRIVATE HEALTH INSURANCE | Admitting: Podiatrist

## 2010-12-01 DIAGNOSIS — M79673 Pain in unspecified foot: Secondary | ICD-10-CM

## 2010-12-01 DIAGNOSIS — G9050 Complex regional pain syndrome I, unspecified: Secondary | ICD-10-CM

## 2010-12-01 DIAGNOSIS — M792 Neuralgia and neuritis, unspecified: Secondary | ICD-10-CM

## 2010-12-01 DIAGNOSIS — S9030XA Contusion of unspecified foot, initial encounter: Secondary | ICD-10-CM

## 2010-12-01 MED ORDER — TAPENTADOL HCL ER 150 MG PO TB12
1.0000 | ORAL_TABLET | Freq: Two times a day (BID) | ORAL | Status: DC
Start: 2010-12-01 — End: 2010-12-01

## 2010-12-01 MED ORDER — TAPENTADOL HCL ER 150 MG PO TB12
1.0000 | ORAL_TABLET | Freq: Two times a day (BID) | ORAL | Status: DC
Start: 2010-12-01 — End: 2010-12-16

## 2010-12-01 MED ORDER — TAPENTADOL HCL ER 150 MG PO TB12
1.0000 | ORAL_TABLET | Freq: Two times a day (BID) | ORAL | Status: DC
Start: 2010-12-01 — End: 2010-12-08

## 2010-12-01 NOTE — Progress Notes (Signed)
Date of Service: 11/25/2010    SUBJECTIVE:  The patient comes to the office today for followup for severe pain in her right foot.  She was experiencing some slight improvement; however, now her pain has gotten significantly worse again.  In addition, she had a reaction to the large dose of steroid, which initially was giving her some relief but now is not solving the problem.  Furthermore, she reports that she was recently fired from her job at [employer], which may be a result of her inability to continue to perform work to her foot pain.    OBJECTIVE:  Clinical examination today reveals no evidence of breaks in the skin and no history of new contusions.  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 PT block was administered in standard fashion.  She tolerated it well without any complications.      Plan is for her to return in 1 week for a repeat injection.    ___________________________  Reviewed and Electronically Signed By: Naoma Diener DPM  Sig Date: 12/01/2010  Sig Time: 17:42:41  Dictated By: Naoma Diener DPM  Dict Date: 11/28/2010 Dict Time: 11 42 PM    Dictation Date and Time:11/28/2010 23:42:29  Transcription Date and Time:11/29/2010 02:59:51  eScription Dictation id: [id number] Confirmation # :[id number]

## 2010-12-01 NOTE — Progress Notes (Signed)
Date of Service: 12/01/2010    SUBJECTIVE:  The patient presents to the office today with a chief concern of increasing pain in her right foot.  Today, she presents with markedly mottled skin over the dorsal aspect beginning at the Lisfranc joint and going distally and laterally.  She denies fever, chills, nausea, vomiting, night sweats, and shortness of breath.  She indicates that she is still having considerable difficulty with sleeping, and her steroids have been tapered off, down to 35 mg.  She reports that following her last injection she received approximately 4 days of relief before pins and needles returned and now her foot is very symptomatic, consistent with reflex sympathetic dystrophy.    PAST MEDICAL HISTORY:  Otherwise, unremarkable.      ASSESSMENT AND PLAN:  I have noted that her pain has actually increased overall, and the appearance of the skin as well as the modeling and temperature is much worse than her last visit.  Based on today's evaluation, I have strongly recommended that she not return to work for the time being due to these problems.  I have also given her a prescription for Nucynta ER 150 mg to be taken b.i.d. on a p.r.n. basis.  We will follow up with her on a p.r.n. basis as indicated in 1 week.  We have administered 7 mL of bupivacaine with 0.5 mL of dexamethasone 4 mg per mL to her medial aspect of her right ankle to do a posterior tibial nerve block.  She reports some improvement immediately upon injection.    ___________________________  Reviewed and Electronically Signed By: Naoma Diener DPM  Sig Date: 12/01/2010  Sig Time: 17:42:53  Dictated By: Naoma Diener DPM  Dict Date: 12/01/2010 Dict Time: 11 27 AM    Dictation Date and Time:12/01/2010 11:27:16  Transcription Date and Time:12/01/2010 13:03:20  eScription Dictation id: [id number] Confirmation # :[id number]

## 2010-12-02 ENCOUNTER — Telehealth (HOSPITAL_BASED_OUTPATIENT_CLINIC_OR_DEPARTMENT_OTHER): Payer: Self-pay | Admitting: Registered Nurse

## 2010-12-02 DIAGNOSIS — F419 Anxiety disorder, unspecified: Secondary | ICD-10-CM

## 2010-12-02 NOTE — Telephone Encounter (Signed)
Message copied by Ulla Potash on Wed Dec 02, 2010  2:15 PM  ------       Message from: [name]       Created: Wed Dec 02, 2010  1:09 PM       Regarding: speak to the nurse - Meds         [facility]              Shiasia Porro [id number], 40 year old, female, Telephone Information:       Home Phone      [phone]       Work Phone      Not on file.       Mobile          [phone]                     [name] NUMBER:  [phone]       Best time to call back: anytime       Cell phone:        Other phone:              Available times:              Patient's language of care: English              Patient does not need an interpreter.              Patient's PCP: Mitchell Heir, MD              Person calling on behalf of patient: Patient (self)              Calls today to speak to nurse  Regarding her meds to help her to sleep. Patient said this meds does not work  very well.  Please call the patient and advise.              Thanks                     Patient's Preferred Pharmacy:        [facility] [address] [place]              Phone: [phone] Fax: [phone]

## 2010-12-02 NOTE — Telephone Encounter (Signed)
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  Any other idea to help sleep?  Let her know Dr.Frank was out of the clinic   RN to notify covering provider  Agrees with plan

## 2010-12-02 NOTE — Telephone Encounter (Signed)
Spoke with patient relayed the plan  She will hold off on any changes at this time  Hasn't started Tapentadol, Ortho said that might help with sleeping   She will continue on regime for now Apt made with Dr. Homero Fellers 12/22/10  She wonders if she might benefit from some mental health counseling    Would like to have a referral now so she can start on the waiting list   Rn to notify covering provider   Agrees with plan

## 2010-12-02 NOTE — Telephone Encounter (Signed)
1) pt has f/u with podiatry next week  2) would recommend f/u visit with PCP to address these issues

## 2010-12-04 ENCOUNTER — Ambulatory Visit (HOSPITAL_BASED_OUTPATIENT_CLINIC_OR_DEPARTMENT_OTHER): Payer: Self-pay | Admitting: Pulmonary

## 2010-12-07 NOTE — Telephone Encounter (Signed)
Order placed

## 2010-12-08 ENCOUNTER — Ambulatory Visit (HOSPITAL_BASED_OUTPATIENT_CLINIC_OR_DEPARTMENT_OTHER): Payer: PRIVATE HEALTH INSURANCE | Admitting: Podiatrist

## 2010-12-08 DIAGNOSIS — G9050 Complex regional pain syndrome I, unspecified: Secondary | ICD-10-CM

## 2010-12-08 DIAGNOSIS — M79673 Pain in unspecified foot: Secondary | ICD-10-CM

## 2010-12-08 DIAGNOSIS — J45909 Unspecified asthma, uncomplicated: Secondary | ICD-10-CM

## 2010-12-08 MED ORDER — POTASSIUM CHLORIDE 10 MEQ PO TBCR
10.0000 meq | EXTENDED_RELEASE_TABLET | Freq: Every day | ORAL | Status: AC
Start: 2010-12-08 — End: 2011-01-08

## 2010-12-08 MED ORDER — TRAMADOL HCL 50 MG PO TABS
50.0000 mg | ORAL_TABLET | Freq: Four times a day (QID) | ORAL | Status: DC | PRN
Start: 2010-12-08 — End: 2011-04-07

## 2010-12-08 MED ORDER — FUROSEMIDE 20 MG PO TABS
20.0000 mg | ORAL_TABLET | Freq: Two times a day (BID) | ORAL | Status: AC
Start: 2010-12-08 — End: 2011-01-08

## 2010-12-08 MED ORDER — DOCUSATE SODIUM 100 MG PO CAPS
100.00 mg | ORAL_CAPSULE | Freq: Two times a day (BID) | ORAL | Status: AC
Start: 2010-12-08 — End: 2010-12-28

## 2010-12-08 MED ORDER — TAPENTADOL HCL ER 150 MG PO TB12
1.0000 | ORAL_TABLET | Freq: Two times a day (BID) | ORAL | Status: AC
Start: 2010-12-08 — End: 2010-12-13

## 2010-12-08 MED ORDER — LIDOCAINE 5 % EX PTCH
1.0000 | MEDICATED_PATCH | CUTANEOUS | Status: DC
Start: 2010-12-08 — End: 2011-12-10

## 2010-12-08 NOTE — Progress Notes (Signed)
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 Her pain continues to be poorly controlled. She has had up to 4 day relief from nerve blocks in the past, but her last nerve block did not help. She is taking Nucynta for pain and does have some relief and it makes her less drowsy than the Percocet she had previously been taking. She continues to use her TENS unit daily and applies Lidoderm patch daily. She is currently on prednisone 30 mg daily as well. She is currently is not in physical therapy or being seen by Pain management. She has been fired from her job and is currently unemployed.    ROS: Relates recent asthma exacerbation and shaky this morning after having a breathing treatment this morning.    Past medical history, surgical history, medications and social history reviewed.    EXAM:  RIGHT dorsum forefoot and digits 1-5 are mottled in coloration and cool to touch (her contralateral side is warm). She has significant pain on light palpation of the dorsum of the foot. No numbness. Hair growth is absent to the foot. 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 Refer to physical therapy for evaluation and treatment  2. Refer to [facility]  3. Continue Nucynta 150 mg- new Rx provided  4. Continue daily TENS unit  5. Continue Lidoderm 5% patch- new Rx provided with 3 refills  6. Tibial nerve block performed under aseptic technique with 7 cc 0.5% bupivicaine and 0.5 dd dexamethasone phosphate 4 mg/mL  7. Return to clinic on 12-10-10 for subsequent nerve block  8. Refilled furosemide and potassium prescriptions  9. Rx for Tramadol 50 mg PRN breakthrough pain  10. Rx Colace 100 mg PO BID

## 2010-12-10 ENCOUNTER — Ambulatory Visit (HOSPITAL_BASED_OUTPATIENT_CLINIC_OR_DEPARTMENT_OTHER): Payer: PRIVATE HEALTH INSURANCE | Admitting: Podiatrist

## 2010-12-10 DIAGNOSIS — B029 Zoster without complications: Secondary | ICD-10-CM

## 2010-12-10 DIAGNOSIS — M79673 Pain in unspecified foot: Secondary | ICD-10-CM

## 2010-12-10 DIAGNOSIS — G9050 Complex regional pain syndrome I, unspecified: Secondary | ICD-10-CM

## 2010-12-10 MED ORDER — VALACYCLOVIR HCL 500 MG PO TABS
500.0000 mg | ORAL_TABLET | Freq: Two times a day (BID) | ORAL | Status: DC
Start: 2010-12-10 — End: 2011-07-23

## 2010-12-13 NOTE — Progress Notes (Signed)
Patient was seen with resident Dr. Buchanan.  I concur with their assessment and treatment plan.

## 2010-12-13 NOTE — Progress Notes (Signed)
Pt given a PT nerve block, right foot.  She had approximately 3 days of relief from last injection.      Foot remains cool and skin mottled, consistent with RSD.    Pt is also experience pain which appears to be a result of shingles, including tapioca spots along dermatome, and she has a history of severe chicken pox as a child.

## 2010-12-14 ENCOUNTER — Telehealth (HOSPITAL_BASED_OUTPATIENT_CLINIC_OR_DEPARTMENT_OTHER): Payer: Self-pay | Admitting: Internal Medicine

## 2010-12-14 NOTE — Telephone Encounter (Signed)
Prior authorization required for medication: CLINDAMYCIN-BENZOYL PEROX GEL  Membership ID (insurance): [id number]  if Medicare insurance , Prescription drug plan:

## 2010-12-15 NOTE — Telephone Encounter (Signed)
Prior authorization request for clindamycin-benzoyl peroxide gel  Clinical Indication: acne  Previous medications tried: benzoyl peroxide, retin-A  Diagnostic testing information: None

## 2010-12-16 ENCOUNTER — Ambulatory Visit (HOSPITAL_BASED_OUTPATIENT_CLINIC_OR_DEPARTMENT_OTHER): Payer: PRIVATE HEALTH INSURANCE | Admitting: Podiatrist

## 2010-12-16 DIAGNOSIS — G9050 Complex regional pain syndrome I, unspecified: Secondary | ICD-10-CM

## 2010-12-16 MED ORDER — TAPENTADOL HCL ER 150 MG PO TB12
1.0000 | ORAL_TABLET | Freq: Two times a day (BID) | ORAL | Status: DC
Start: 2010-12-16 — End: 2010-12-22

## 2010-12-17 NOTE — Telephone Encounter (Signed)
PRIOR AUTHORIZATION FOR CLINDAMYCIN-BENZOYL PEROXIDE GEL    Patient has [place] for [place].  I.D.#: [id number]    Filled out  Prior Authorization Request form. Scanned form into Epic under Careers information officer (PA for CLINDAMYCIN/BENZOYL GEL).    Please print out form to be reviewed, signed, and faxed to the insurance company.

## 2010-12-17 NOTE — Telephone Encounter (Signed)
Left PA form in provider's mail box; to be completed by provider  for further processing.

## 2010-12-22 ENCOUNTER — Telehealth (HOSPITAL_BASED_OUTPATIENT_CLINIC_OR_DEPARTMENT_OTHER): Payer: Self-pay | Admitting: Registered Nurse

## 2010-12-22 ENCOUNTER — Ambulatory Visit (HOSPITAL_BASED_OUTPATIENT_CLINIC_OR_DEPARTMENT_OTHER): Payer: Medicaid Other | Admitting: Podiatrist

## 2010-12-22 ENCOUNTER — Ambulatory Visit (HOSPITAL_BASED_OUTPATIENT_CLINIC_OR_DEPARTMENT_OTHER): Payer: Medicaid Other | Admitting: Internal Medicine

## 2010-12-22 ENCOUNTER — Encounter (HOSPITAL_BASED_OUTPATIENT_CLINIC_OR_DEPARTMENT_OTHER): Payer: Self-pay | Admitting: Internal Medicine

## 2010-12-22 VITALS — BP 120/90 | HR 113 | Temp 97.7°F | Resp 12

## 2010-12-22 DIAGNOSIS — F308 Other manic episodes: Secondary | ICD-10-CM

## 2010-12-22 DIAGNOSIS — J45901 Unspecified asthma with (acute) exacerbation: Secondary | ICD-10-CM

## 2010-12-22 DIAGNOSIS — I10 Essential (primary) hypertension: Secondary | ICD-10-CM

## 2010-12-22 DIAGNOSIS — G9050 Complex regional pain syndrome I, unspecified: Secondary | ICD-10-CM

## 2010-12-22 DIAGNOSIS — E039 Hypothyroidism, unspecified: Secondary | ICD-10-CM

## 2010-12-22 LAB — POTASSIUM: POTASSIUM: 3.9 mmol/L (ref 3.5–5.1)

## 2010-12-22 MED ORDER — ONE-DAILY MULTI VITAMINS PO TABS
1.0000 | ORAL_TABLET | Freq: Every day | ORAL | Status: DC
Start: 2010-12-22 — End: 2012-01-17

## 2010-12-22 MED ORDER — AMLODIPINE BESYLATE 5 MG PO TABS
5.0000 mg | ORAL_TABLET | Freq: Every day | ORAL | Status: AC
Start: 2010-12-22 — End: 2011-12-23

## 2010-12-22 MED ORDER — FLUTICASONE-SALMETEROL 500-50 MCG/DOSE IN AEPB
1.0000 | INHALATION_SPRAY | Freq: Two times a day (BID) | RESPIRATORY_TRACT | Status: DC
Start: 2010-12-22 — End: 2011-12-15

## 2010-12-22 MED ORDER — IPRATROPIUM-ALBUTEROL 0.5-2.5 (3) MG/3ML IN SOLN
3.0000 mL | Freq: Four times a day (QID) | RESPIRATORY_TRACT | Status: DC | PRN
Start: 2010-12-22 — End: 2011-07-21

## 2010-12-22 MED ORDER — TAPENTADOL HCL ER 150 MG PO TB12
1.0000 | ORAL_TABLET | Freq: Two times a day (BID) | ORAL | Status: DC
Start: 2010-12-22 — End: 2011-04-21

## 2010-12-22 MED ORDER — QUETIAPINE FUMARATE 25 MG PO TABS
25.0000 mg | ORAL_TABLET | ORAL | Status: DC
Start: 2010-12-22 — End: 2010-12-22

## 2010-12-22 MED ORDER — QUETIAPINE FUMARATE 25 MG PO TABS
ORAL_TABLET | ORAL | Status: AC
Start: 2010-12-22 — End: 2011-01-21

## 2010-12-22 MED ORDER — ZOLPIDEM TARTRATE ER 12.5 MG PO TBCR
12.5000 mg | EXTENDED_RELEASE_TABLET | Freq: Every evening | ORAL | Status: DC | PRN
Start: 2010-12-22 — End: 2010-12-22

## 2010-12-22 MED ORDER — BUPROPION HCL ER (XL) 300 MG PO TB24
300.0000 mg | ORAL_TABLET | Freq: Every day | ORAL | Status: DC
Start: 2010-12-22 — End: 2012-01-31

## 2010-12-22 MED ORDER — ALBUTEROL SULFATE HFA 108 (90 BASE) MCG/ACT IN AERS
2.0000 | INHALATION_SPRAY | Freq: Four times a day (QID) | RESPIRATORY_TRACT | Status: DC | PRN
Start: 2010-12-22 — End: 2011-12-15

## 2010-12-22 MED ORDER — HYDROCHLOROTHIAZIDE 25 MG PO TABS
25.0000 mg | ORAL_TABLET | Freq: Every day | ORAL | Status: DC
Start: 2010-12-22 — End: 2012-01-06

## 2010-12-22 NOTE — Telephone Encounter (Signed)
Sorry - it's take 2 tablets by mouth qhs. Then, take 1 tablet by mouth bid prn anxiety.  thanks

## 2010-12-22 NOTE — Telephone Encounter (Signed)
Call to pharmacy with directions as below.  They want to confirm dispense quant, as currently written for #60, pt will not have enough quantity.    [name]:  They want to double check quant to dispense. Won't be enough for a month as written.

## 2010-12-22 NOTE — Telephone Encounter (Signed)
Reordered with correct sig and #90

## 2010-12-22 NOTE — Telephone Encounter (Signed)
Message copied by Olevia Bowens on Tue Dec 22, 2010  3:15 PM  ------       Message from: Natalia Leatherwood       Created: Tue Dec 22, 2010  3:08 PM       Regarding: medication direction          [facility]              Madison Mckay [id number], 40 year old, female, Telephone Information:       Home Phone      [phone]       Work Phone      Not on file.       Mobile          [phone]                     [name] NUMBER: [phone]       Best time to call back: anytime        Cell phone:        Other phone:              Available times:              Patient's language of care: English              Patient does not need an interpreter.              Patient's PCP: Mitchell Heir, MD              Person calling on behalf of patient: Ming Intern [facility]               Calls today [facility] would like to if RN could assist them with direction for the listed pt medication: QUEtiapine (SEROQUEL) 25 MG tablet ) that was fax over today to them, Intern would like for a clear direction of the medication for the pt.               pls advise               Patient's Preferred Pharmacy:        [facility] [address] [place]              Phone: [phone] Fax: [phone]
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
## 2010-12-22 NOTE — Telephone Encounter (Signed)
[name]:  I know it's an Epic thing, but can you please confirm Seroquel directions?  I will call them.

## 2010-12-23 ENCOUNTER — Encounter (HOSPITAL_BASED_OUTPATIENT_CLINIC_OR_DEPARTMENT_OTHER): Payer: Self-pay | Admitting: Internal Medicine

## 2010-12-23 ENCOUNTER — Ambulatory Visit (HOSPITAL_BASED_OUTPATIENT_CLINIC_OR_DEPARTMENT_OTHER): Payer: Medicaid Other

## 2010-12-23 ENCOUNTER — Telehealth (HOSPITAL_BASED_OUTPATIENT_CLINIC_OR_DEPARTMENT_OTHER): Payer: Self-pay

## 2010-12-23 NOTE — Telephone Encounter (Signed)
Pt called to r/s appt. She is sick with flu. We r/s for next week 12/30/10 at 10am.

## 2010-12-24 ENCOUNTER — Ambulatory Visit (HOSPITAL_BASED_OUTPATIENT_CLINIC_OR_DEPARTMENT_OTHER): Payer: PRIVATE HEALTH INSURANCE | Admitting: Podiatrist

## 2010-12-24 DIAGNOSIS — G9050 Complex regional pain syndrome I, unspecified: Secondary | ICD-10-CM

## 2010-12-24 NOTE — Progress Notes (Signed)
40 year old female presents for follow up for reflex sympathetic dystrophy of the right foot. Patient has been receiving serial PT blocks, last one being 6 days ago. She does state that the time it takes for the pain to return has been increasing after every injection. He current pain is level is 4/10. She has been ambulating in a CAM walker, which she states does help the pain. She continues to take Nucynta and only occasionally takes Toradol and uses the Lidoderm patches. She has not yet begun her treatment through [facility] pain clinic.   ROS: Right foot pain per above    PMH, Meds, Allergies reviewed. Patient has had some exacerbations of her asthma and has been on prednisone, which she only recently stopped.     PE:  - Gen: A&Ox3, NAD  - Right LE: Discoloration and skin mottling has significant improved. She continues to have tenderness with light touch to the dorsum of the foot.     A/P: Reflex Sympathetic Dystrophy, right foot  - A PT block consisting of 5cc 0.5% Marcaine pl and 1/4cc Dexamethasone 4mg  was administered. Patient tolerated procedure well with no complications. She should continue with her current pain regimen. We will contact [facility] pain clinic to confirm that she is approved. She will follow up next Tuesday.

## 2010-12-24 NOTE — Progress Notes (Signed)
Patient was seen with resident Dr. Ben-Ad.  I concur with their assessment and treatment plan.

## 2010-12-28 NOTE — Progress Notes (Signed)
Pt seen in clinic today with some continued improvement following nerve block to right posterior tibial nerve.  PMH is reviewed and is unchanged since last visit.     Physical Exam:  Pt has palpable dorsalis pedis and posterior tibial pulses.  Sensorium is grossly intact.  Pt has 5/5 muscle strength bilaterally, for lower extremity based on manual muscle testing of DF/PF, Inversion/Eversion, External/Internal rotation, and contraction/extension of feet.  Pt is (-) for calf pain with direct compression.  Pt denies f/c/n/v/sob. Pt is awake, alert and oriented, and is able to communicate their concerns and appears to understand my responses.    Skin still appears mottled, but is warmer and less mottled then her last visit.    PT block consisting of 5 cc Bupivicaine plain is administered along with 1/2 cc of dexamethasone, 4mg /ml.  Pt tolerated the injection without complication, and experienced immediate relief.

## 2010-12-29 ENCOUNTER — Ambulatory Visit (HOSPITAL_BASED_OUTPATIENT_CLINIC_OR_DEPARTMENT_OTHER): Payer: PRIVATE HEALTH INSURANCE | Admitting: Podiatrist

## 2010-12-29 ENCOUNTER — Ambulatory Visit (HOSPITAL_BASED_OUTPATIENT_CLINIC_OR_DEPARTMENT_OTHER): Payer: Medicaid Other | Admitting: Pulmonary

## 2010-12-29 DIAGNOSIS — M79673 Pain in unspecified foot: Secondary | ICD-10-CM

## 2010-12-29 DIAGNOSIS — G9050 Complex regional pain syndrome I, unspecified: Secondary | ICD-10-CM

## 2010-12-29 MED ORDER — OXYCODONE-ACETAMINOPHEN 5-325 MG PO TABS
1.0000 | ORAL_TABLET | Freq: Three times a day (TID) | ORAL | Status: AC | PRN
Start: 2010-12-29 — End: 2011-01-12

## 2010-12-30 ENCOUNTER — Ambulatory Visit (HOSPITAL_BASED_OUTPATIENT_CLINIC_OR_DEPARTMENT_OTHER): Payer: Medicaid Other

## 2010-12-30 ENCOUNTER — Ambulatory Visit (HOSPITAL_BASED_OUTPATIENT_CLINIC_OR_DEPARTMENT_OTHER): Payer: 59 | Admitting: Podiatrist

## 2010-12-30 DIAGNOSIS — Z7189 Other specified counseling: Secondary | ICD-10-CM

## 2010-12-30 NOTE — Progress Notes (Signed)
Met with pt for first time per request of PCP Dr. Mitchell Heir.  Pt wearing walking cast boot due to work injury sustained in July 2011.  Has been on Wellbutrin for about 5 years but PCP added seroquel for sleep. Pt having significant activation due to predisone which gives insomnia, irritability. Stopped taking prednisone a week ago and hopes that she will feel calmer, lose weight and be able to sleep, all of which could be attributed to prednisone.  She reports that she gained 25 lbs over the last 2 mos since taking prednisone.  In treatment for her foot -- getting physical therapy nerve blocks and taking pain medication. Also suffering from shingles for several week.  Has complicated legal situation. 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 Signed a "confession" under duress. Fears that she may face criminal charges and has lost her pharmacy license.  All of this is completely devastating.  She moved to [place] 2[place]5 years ago to join fiance. Has devoted all of her energy to work so has no support network. Has 2 kids, 18 yo autistic son and 1 yo dau from previous relat.    She is interested in supportive therapy and is open to mind-body stress reduction techniques. We discussed the importance of self-care during this very difficult time. She acknowledges that she has always been a "giver" and has not paid much attention to her own needs. SW provided titles of a few books that she can get at [employer] and also a meditation center in the neighborhood.    We will meet again in 2 weeks.

## 2010-12-30 NOTE — Progress Notes (Signed)
This office note has been dictated. Account number [id number]

## 2010-12-31 NOTE — Progress Notes (Signed)
This office note has been dictated. Account number [id number]

## 2011-01-05 ENCOUNTER — Encounter (HOSPITAL_BASED_OUTPATIENT_CLINIC_OR_DEPARTMENT_OTHER): Payer: Self-pay

## 2011-01-05 ENCOUNTER — Emergency Department (HOSPITAL_BASED_OUTPATIENT_CLINIC_OR_DEPARTMENT_OTHER)
Admission: RE | Admit: 2011-01-05 | Disposition: A | Discharge status: Home / Self Care | Payer: Self-pay | Source: Emergency Department | Attending: Emergency Medicine | Admitting: Emergency Medicine

## 2011-01-05 ENCOUNTER — Telehealth (HOSPITAL_BASED_OUTPATIENT_CLINIC_OR_DEPARTMENT_OTHER): Payer: Self-pay | Admitting: Registered Nurse

## 2011-01-05 HISTORY — DX: Zoster without complications: B02.9

## 2011-01-05 HISTORY — DX: Complex regional pain syndrome i of unspecified lower limb: G90.529

## 2011-01-05 LAB — CBC, PLATELET & DIFFERENTIAL
BASOPHIL %: 0.2 % (ref 0.0–2.0)
EOSINOPHIL %: 0.3 % (ref 0.0–7.0)
HEMATOCRIT: 46.3 % (ref 36.0–48.0)
HEMOGLOBIN: 15.6 g/dl (ref 12.0–16.0)
LYMPHOCYTE %: 13.7 % (ref 13.0–39.0)
MEAN CORP HGB CONC: 33.6 g/dl (ref 32.0–36.0)
MEAN CORPUSCULAR HGB: 30.5 pg (ref 27.0–33.0)
MEAN CORPUSCULAR VOL: 90.9 fl (ref 80.0–100.0)
MEAN PLATELET VOLUME: 9.1 fl (ref 6.4–10.8)
MONOCYTE %: 6.7 % (ref 1.0–12.0)
NEUTROPHIL %: 79.1 % — ABNORMAL HIGH (ref 46.0–79.0)
PLATELET COUNT: 236 10*3/uL (ref 150–400)
RBC DISTRIBUTION WIDTH: 12.8 % (ref 11.5–14.3)
RED BLOOD CELL COUNT: 5.1 M/uL (ref 4.50–5.10)
WHITE BLOOD CELL COUNT: 9.7 10*3/uL (ref 4.0–10.8)

## 2011-01-05 LAB — COMPREHENSIVE METABOLIC PANEL
ALANINE AMINOTRANSFERASE: 22 IU/L (ref 7–35)
ALBUMIN: 4.5 g/dl (ref 3.4–4.8)
ALKALINE PHOSPHATASE: 67 IU/L (ref 25–106)
ANION GAP: 10 mmol/L (ref 2–25)
ASPARTATE AMINOTRANSFERASE: 22 IU/L (ref 8–34)
BILIRUBIN TOTAL: 0.8 mg/dl (ref 0.2–1.1)
BUN (UREA NITROGEN): 13 mg/dl (ref 6–20)
CALCIUM: 9.8 mg/dl (ref 8.6–10.3)
CARBON DIOXIDE: 26 mmol/L (ref 22–32)
CHLORIDE: 102 mmol/L (ref 101–111)
CREATININE: 0.7 mg/dl (ref 0.4–1.2)
ESTIMATED GLOMERULAR FILT RATE: >60 mL/min (ref >60)
Glucose Random: 103 mg/dl (ref 74–160)
POTASSIUM: 3.6 mmol/L (ref 3.5–5.1)
SODIUM: 138 mmol/L (ref 135–144)
TOTAL PROTEIN: 7.4 g/dl (ref 5.9–7.5)

## 2011-01-05 LAB — URINE DIP (POINT OF CARE)
GLUCOSE, URINE: NEGATIVE mg/dl
KETONE, URINE: 160 mg/dl
LEUKOCYTE ESTERASE: NEGATIVE
NITRITE, URINE: NEGATIVE
OCCULT BLOOD, URINE: NEGATIVE
PH URINE: 5.5 (ref 5.0–8.0)
PROTEIN, URINE: 30 mg/dl — AB (ref 0–15)
SPECIFIC GRAVITY URINE: 1.03 (ref 1.003–1.030)
UROBILINOGEN URINE: 0.2 mg/dl (ref 0.2–1.0)

## 2011-01-05 LAB — LIPASE: LIPASE: 37 U/L (ref 10–50)

## 2011-01-05 MED ORDER — ONDANSETRON HCL 4 MG PO TABS
4.0000 mg | ORAL_TABLET | Freq: Three times a day (TID) | ORAL | Status: DC | PRN
Start: 2011-01-05 — End: 2011-01-12

## 2011-01-05 MED ORDER — MORPHINE SULFATE 4 MG/ML IJ SOLN
4.00 mg | Freq: Once | INTRAMUSCULAR | Status: AC
Start: 2011-01-05 — End: 2011-01-05
  Administered 2011-01-05: 4 mg via INTRAVENOUS
  Filled 2011-01-05: qty 1

## 2011-01-05 MED ORDER — SODIUM CHLORIDE 0.9 % IV BOLUS
1000.00 mL | Freq: Once | INTRAVENOUS | Status: AC
Start: 2011-01-05 — End: 2011-01-05
  Administered 2011-01-05: 1000 mL via INTRAVENOUS

## 2011-01-05 MED ORDER — ONDANSETRON HCL 4 MG/2ML IJ SOLN
4.00 mg | Freq: Once | INTRAMUSCULAR | Status: AC
Start: 2011-01-05 — End: 2011-01-05
  Administered 2011-01-05: 4 mg via INTRAVENOUS
  Filled 2011-01-05: qty 2

## 2011-01-05 NOTE — Telephone Encounter (Signed)
Call to pt.  Left voicemail message to return RN call prn.

## 2011-01-05 NOTE — ED Notes (Signed)
Right arm hasno hives, redness decreased.  Pt says it is not bothering her and "suprised it did that because i had morphine with my knee surgery with no problem"

## 2011-01-05 NOTE — ED Triage Note (Signed)
For the last 5 days states feels "hot, aches" vomiting bile.  Yellow loose stool.  Mid abd pain r/t back for 3-4 days.

## 2011-01-05 NOTE — ED Provider Notes (Signed)
I have reviewed the ED nursing notes and prior records. I have reviewed the patient's past medical history/problem list, allergies, social history and medication list.  I saw this patient primarily.    HPI:  This 40 year old female patient presents with chief complaint of nausea vomiting and diarrhea.    Patient states symptoms started gradually 4 days ago.  The symptoms have been continual since that time.  She reports watery yellow diarrhea.  She denies hematochezia, or melena.  She reports several episodes of vomiting per day with persistent nausea.  She also reports diffuse lower abdominal pain is continual and seems to be unrelated to her vomiting or diarrhea.  She rates this pain a 6/10 nonradiating pain.  She also reports subjective fevers and states that her temperature this morning was 100.    ROS: Pertinent positives were reviewed as per the HPI above. All other systems were reviewed and are negative.    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     Comment: crush injury right foot       Patient Active Problem List:     Asthma [493.90T]     Contusion, foot [924.20B]     Neuritis [729.81F]     RSD (reflex sympathetic dystrophy) [337.20L]     Hypertension [401.9AH]     Hypothyroidism [244.9Y]     Right shoulder pain [719.41X]      Past Surgical History:    Past Surgical History    TAH +-RMVL TUBE +-RMVL OVARY     Comment just TAH no ovary removal, 2005; laparascopies x 2 before TAH for endometriosis    ACL REPAIR     Comment left 2004, cadavaric acl    OB ANTEPARTUM CARE C DLVR&POSTPARTUM     Comment x2, 97 and 98    ROTATOR CUFF  REPAIR     Comment Labral repair anchor not sure if plastic or metal but had MRI with this in place without problems, on right shoulder         Medications:     No current facility-administered medications on file prior to encounter.  Current outpatient prescriptions ordered prior to encounter:  Tapentadol HCl (NUCYNTA) 150 MG TB12 Take 1 tablet by mouth 2 (two) times daily. Disp: 60 tablet Rfl: 0   fluticasone-salmeterol (ADVAIR DISKUS) 500-50 MCG/DOSE AEPB Aerosol Powder Inhale 1 puff into the lungs every 12 (twelve) hours. Disp: 180 each Rfl: 4   buPROPion (WELLBUTRIN XL) 300 MG 24 hr tablet Take 1 tablet by mouth daily. Disp: 90 tablet Rfl: 4   albuterol (PROAIR HFA) 108 (90 BASE) MCG/ACT inhaler Inhale 2 puffs into the lungs every 6 (six) hours as needed for Wheezing. Disp: 1 Inhaler Rfl: 3   hydrochlorothiazide (HYDRODIURIL) 25 MG tablet Take 1 tablet by mouth daily. Disp: 90 tablet Rfl: 4   amlodipine (NORVASC) 5 MG tablet Take 1 tablet by mouth daily. Disp: 90 tablet Rfl: 4   Multiple Vitamin (MULTIVITAMIN) tablet Take  1 tablet by mouth daily. Disp: 30 tablet Rfl: 12   lidocaine (LIDODERM) 5 % patch Place 1 patch onto the skin daily. Disp: 30 patch Rfl: 3   potassium chloride (KLOR-CON) 10 MEQ CR tablet Take 1 tablet by mouth daily. Disp: 30 tablet Rfl: 0   furosemide (LASIX) 20 MG tablet Take 1 tablet by mouth 2 (two) times daily. Disp: 60 tablet Rfl: 1   traMADol (ULTRAM) 50 MG tablet Take 1 tablet by mouth every 6 (six) hours as needed. Disp: 30 tablet Rfl: 0   mupirocin (BACTROBAN) 2 % ointment Apply  topically 2 (two) times daily. As needed for bleeding and crusting. Disp: 22 g Rfl: 2   albuterol (PROVENTIL) (2.5 MG/3ML) 0.083% nebulizer solution Take 3 mLs by nebulization every 6 (six) hours as needed for Wheezing. Disp: 75 mL Rfl: 4   Spacer/Aero-Holding Chambers (AEROCHAMBER MAX W/MASK MEDIUM) device Use as instructed Disp: 1 Device Rfl: 0   Nebulizer MISC 1 kit by Does not apply route as needed.  Disp: 1 each Rfl: 0   SYNTHROID 125 MCG tablet Take 1 tablet by mouth every morning. Disp: 90 tablet Rfl: 4   EPINEPHrine (EPIPEN 2-PAK) 0.3 MG/0.3ML Injection Device Inject 0.3 mg into the muscle once as needed. Disp: 1 each Rfl: 1   clindamycin-benzoyl peroxide (BENZACLIN) gel Apply  topically 2 (two) times daily. Disp: 50 g Rfl: 4   lidocaine (LIDODERM) 5 % patch Place 1 patch onto the skin daily. Disp: 90 patch Rfl: 4   oxycodone-acetaminophen (PERCOCET) 5-325 MG per tablet Take 1 tablet by mouth every 8 (eight) hours as needed for Pain. Disp: 30 tablet Rfl: 0   ipratropium-albuterol (DUO-NEB) 0.5-2.5 (3) MG/3ML SOLN Inhalation Solution Take 3 mLs by nebulization 4 (four) times daily as needed. Disp: 1000 mL Rfl: 11   QUEtiapine (SEROQUEL) 25 MG tablet Take  by mouth. Take 2 tablets by mouth qhs. Then take 1 tablet bid prn anxiety. Disp: 90 tablet Rfl: 0   hydrOXYzine (ATARAX) 25 MG tablet Take 1 tablet by mouth every 8 (eight) hours as needed for Itching and Anxiety. Disp: 30 tablet Rfl: 0         Social History:     Smoking status: Never Smoker     Smokeless tobacco:     Alcohol Use: No       Allergies:  Review of Patient's Allergies indicates:   Penicillins             Hives   Oxycodone                   Comment:Hives, other narcotics OK   Erythromycin            Nausea Only    Comment:Stomach pain severe no nausea, can take             azithromycin    Physical Exam:  BP 132/103   Pulse 115   Temp 97.6 F (36.4 C)   Resp 18   Wt 72.576 kg (160 lb)   SpO2 100%  GENERAL:  Well-appearing, no distress.  SKIN:  Warm & Dry, no rash, no bruising.  HEAD:  Atraumatic. PERRL. EOMI. Oropharynx clear.  NECK:  Supple, no midline tenderness, no LAD.  LUNGS:  Clear to auscultation bilaterally without rales, rhonchi or wheezing.   HEART:  RRR.  No murmurs, rubs, or gallops.   ABDOMEN:  Soft, flat, without distension.  Nontender to palpation.   MUSCULOSKELETAL:  No deformities.  NEUROLOGIC:  Normal speech.  Normal gait.    PSYCHIATRIC:  Normal affect    ED Course and Medical Decision-making:    The patient is 40 year old female with nausea, vomiting and diarrhea, most likely viral in etiology.  An IV was placed the patient was given 2 liters normal saline hydration.  She was given 4 milligrams of Zofran for nausea and 4 milligrams of morphine intravenously for her abdominal pain.  Blood work was obtained and reviewed by me.        Admission on 01/05/2011, Discharged on 01/05/2011  COLOR  Date: 01/05/2011   Value: yellow  Low: YELLOW High:  Status: Final   CLARITY  Date: 01/05/2011   Value: clear  Low: CLEAR High:  Status: Final   GLUCOSE, URINE (mg/dl) Date: 16/03/9603   Value: neg  Low: NEGAT* High:  Status: Final   BILIRUBIN, URINE  Date: 01/05/2011   Value: small  Low: NEGAT* High:  Status: Final   KETONE, URINE (mg/dl) Date: 54/02/8118   Value: 160  Low: NEGAT* High:  Status: Final   SPECIFIC GRAVITY URINE  Date: 01/05/2011   Value: 1.030  Low: 1.003 High: 1.030 Status: Final   OCCULT BLOOD, URINE  Date: 01/05/2011   Value: negative  Low: NEGAT* High:  Status: Final   PH URINE  Date: 01/05/2011   Value: 5.5  Low: 5.0 High: 8.0 Status: Final   PROTEIN, URINE (mg/dl) Date: 14/78/2956   Value: 30* Low: 0 High: 15 Status: Final   UROBILINOGEN URINE (mg/dl) Date: 21/30/8657   Value: 0.2  Low: 0.2 High: 1.0 Status: Final   NITRITE, URINE  Date: 01/05/2011   Value: neg  Low: NEGAT* High:  Status: Final   LEUKOCYTE ESTERASE  Date: 01/05/2011   Value: neg  Low: NEGAT* High:  Status: Final   SODIUM (mmol/L) Date: 01/05/2011   Value: 138  Low: 135 High: 144 Status: Final   POTASSIUM (mmol/L) Date: 01/05/2011   Value: 3.6  Low: 3.5 High: 5.1 Status: Final   CHLORIDE (mmol/L) Date: 01/05/2011   Value: 102  Low: 101 High: 111 Status: Final   CARBON DIOXIDE (mmol/L) Date: 01/05/2011   Value: 26  Low: 22 High: 32 Status: Final   ANION GAP (mmol/L) Date: 01/05/2011   Value: 10  Low: 2 High: 25 Status: Final   CALCIUM (mg/dl) Date: 84/69/6295    Value: 9.8  Low: 8.6 High: 10.3 Status: Final   Glucose Random (mg/dl) Date: 28/41/3244   Value: 103  Low: 74 High: 160 Status: Final   BLOOD UREA NITROGEN (mg/dl) Date: 06/09/7251   Value: 13  Low: 6 High: 20 Status: Final   TOTAL PROTEIN (g/dl) Date: 66/44/0347   Value: 7.4  Low: 5.9 High: 7.5 Status: Final   ALBUMIN (g/dl) Date: 42/59/5638   Value: 4.5  Low: 3.4 High: 4.8 Status: Final   BILIRUBIN TOTAL (mg/dl) Date: 75/64/3329   Value: 0.8  Low: 0.2 High: 1.1 Status: Final   ALKALINE PHOSPHATASE (IU/L) Date: 01/05/2011   Value: 67  Low: 25 High: 106 Status: Final   ASPARTATE AMINOTRANSFERAS (IU/L) Date: 01/05/2011   Value: 22  Low: 8 High: 34 Status: Final   CREATININE (mg/dl) Date: 51/88/4166   Value: 0.7  Low: 0.4 High: 1.2 Status: Final   ESTIMATED GLOMERULAR FILT RATE (ML/MIN) Date: 01/05/2011   Value: > 60  Low: > 60 High:  Status: Final   ALANINE AMINOTRANSFERASE (IU/L) Date: 01/05/2011   Value: 22  Low: 7 High: 35 Status: Final  LIPASE (U/L) Date: 01/05/2011   Value: 37  Low: 10 High: 50 Status: Final   WHITE BLOOD CELL (TH/uL) Date: 01/05/2011   Value: 9.7  Low: 4.0 High: 10.8 Status: Final   RED BLOOD CELL COUNT (M/uL) Date: 01/05/2011   Value: 5.10  Low: 4.50 High: 5.10 Status: Final   HEMOGLOBIN (g/dl) Date: 09/81/1914   Value: 15.6  Low: 12.0 High: 16.0 Status: Final   HEMATOCRIT (%) Date: 01/05/2011   Value: 46.3  Low: 36.0 High: 48.0 Status: Final   MEAN CORPUSCULAR VOL (fl) Date: 01/05/2011   Value: 90.9  Low: 80.0 High: 100.0 Status: Final   MEAN CORPUSCULAR HGB (pg) Date: 01/05/2011   Value: 30.5  Low: 27.0 High: 33.0 Status: Final   MEAN CORP HGB CONC (g/dl) Date: 78/29/5621   Value: 33.6  Low: 32.0 High: 36.0 Status: Final   RBC DISTRIBUTION WIDTH (%) Date: 01/05/2011   Value: 12.8  Low: 11.5 High: 14.3 Status: Final   PLATELET COUNT (TH/uL) Date: 01/05/2011   Value: 236  Low: 150 High: 400 Status: Final   MEAN PLATELET VOLUME (fl) Date: 01/05/2011   Value: 9.1  Low: 6.4 High: 10.8 Status:  Final   NEUTROPHIL % (%) Date: 01/05/2011   Value: 79.1* Low: 46.0 High: 79.0 Status: Final   LYMPHOCYTE % (%) Date: 01/05/2011   Value: 13.7  Low: 13.0 High: 39.0 Status: Final   MONOCYTE % (%) Date: 01/05/2011   Value: 6.7  Low: 1.0 High: 12.0 Status: Final   EOSINOPHIL % (%) Date: 01/05/2011   Value: 0.3  Low: 0.0 High: 7.0 Status: Final   BASOPHIL % (%) Date: 01/05/2011   Value: 0.2  Low: 0.0 High: 2.0 Status: Final       Upon reevaluation the patient stated she felt much better and had no further complaints.  She is given a prescription for Zofran and instructed followup with her primary care provider.    Reasons to return to the ED were reviewed in detail. The patient agrees with this plan and disposition.    Disposition:  Discharged to home    Condition:  Stable and improved.    Diagnosis/Diagnoses:  1.  Nausea, vomiting and diarrhea    2.  Abdominal pain.      Leroy Sea, MD

## 2011-01-05 NOTE — ED Notes (Signed)
Clarified no allergy to morphine- pt states no.

## 2011-01-05 NOTE — Telephone Encounter (Signed)
Message copied by Olevia Bowens on Tue Jan 05, 2011  8:55 AM  ------       Message from: [name]       Created: Tue Jan 05, 2011  8:38 AM       Regarding: sick call - fever, headache, diarrhea         [facility]              Madison Mckay [id number], 40 year old, female, Telephone Information:       Home Phone      [phone]       Work Phone      Not on file.       Mobile          [phone]                     CALL BACK NUMBER              Call back # [phone]                     Interpreter:no              Person Calling: patient              Reason for todays call?: sick call              Symptoms: fever over 100,vomiting,headache,abodominal pain and diarrhea.              How long have you been sick?: 4 days                            Reason to speak with a nurse only? No appt available              Reason to speak with a provider only?                            Other call: Reason for call?

## 2011-01-05 NOTE — ED Notes (Signed)
Pt states improvement of redness, hives noted.  Pt states it is getting better and benadryl not needed.  Dr Earl Gala updated. Will continue to monitor.

## 2011-01-05 NOTE — ED Notes (Signed)
Pt experiencing redness at and above site.  Flushed post administration with 20ml NS.  Warm compress applied.  Will reassess.

## 2011-01-05 NOTE — Telephone Encounter (Signed)
Message copied by Olevia Bowens on Tue Jan 05, 2011 10:28 AM  ------       Message from: Natalia Leatherwood       Created: Tue Jan 05, 2011 10:15 AM       Regarding: return call        Contact: [phone]         [facility]              Madison Mckay [id number], 40 year old, female, Telephone Information:       Home Phone      [phone]       Work Phone      Not on file.       Mobile          [phone]                     [name] NUMBER: [phone]       Best time to call back: anytime        Cell phone:        Other phone:              Available times:              Patient's language of care: English              Patient does not need an interpreter.              Patient's PCP: Mitchell Heir, MD              Person calling on behalf of patient: Patient (self)              Calls today Returning phone call, pt is crying and saying she's in pain would like for the RN to call her again. [phone] pt is at home               pls advise               Patient's Preferred Pharmacy:        [facility] [address]              Phone: [phone] Fax: [phone]

## 2011-01-05 NOTE — Telephone Encounter (Signed)
Call to pt.  Reports 4-5 day hx of "body being being really hot" and feeling "gross".  Then developed abdominal pain, and vomiting.   Poor po's. Not able to eat or drink yesterday.  Sipping Pedialyte today, but feels "weak". Vomiting "bile like" substances.  Tearful on phone.  Concern for dehydration.  Agrees to ER eval. Friend to drive her.  Asked that she f/u post ER with plan of care.      [name]:  FYI.  Felt it best she be evaluated in the ER for potential dehydration.  Tx

## 2011-01-05 NOTE — ED Notes (Signed)
Patient Disposition    Patient education for diagnosis, medications, activity, diet and follow-up.  Patient left ED 3:55 PM.  Patient rep received written instructions.  Interpreter to provide instructions: no    Discharged to: Discharged to home. Pt left A&OX3, steady gait

## 2011-01-05 NOTE — ED Notes (Signed)
Pt feels improved labs results with pt. Pt verbalizes understanding

## 2011-01-06 ENCOUNTER — Ambulatory Visit (HOSPITAL_BASED_OUTPATIENT_CLINIC_OR_DEPARTMENT_OTHER): Payer: Medicaid Other | Admitting: Podiatrist

## 2011-01-06 NOTE — Progress Notes (Signed)
Date of Service: 12/22/2010    SUBJECTIVE:  The patient presents to the office today for followup for reflex sympathetic dystrophy of her right foot.  She continues to see some improvement with sequential injections in the posterior tibial nerve.    PLAN:  Today we will dispense 5 mL of 0.5% plain bupivacaine with 0.25 mL of dexamethasone 4 mg per mL.  In addition, I have given her a prescription for _____ for Ambien.  Plan is for her to follow up with [place] twice a week as indicated.    ___________________________  Reviewed and Electronically Signed By: Naoma Diener DPM  Sig Date: 01/06/2011  Sig Time: 19:30:02  Dictated By: Naoma Diener DPM  Dict Date: 12/31/2010 Dict Time: 01 08 AM    Dictation Date and Time:12/31/2010 01:08:01  Transcription Date and Time:12/31/2010 02:03:44  eScription Dictation id: [id number] Confirmation # :[id number]

## 2011-01-06 NOTE — Progress Notes (Signed)
Date of Service: 12/29/2010    SUBJECTIVE:  The patient presents to the office today for followup for reflex sympathetic dystrophy.  She had a PT block performed last week and she indicates that the relief lasted  approximately 3 days.  She has now run out of narcotic pain medication and notes that this last couple of days have been more painful than normal.    ASSESSMENT AND PLAN:  Treatment today consisted of PT block consisting of 0.25 mL of dexamethasone 4 mg/mL and 5 mL of plain bupivacaine.  She tolerated the injection well without any complications noted.  Followup is scheduled for approximately 1 week from now.    ___________________________  Reviewed and Electronically Signed By: Naoma Diener DPM  Sig Date: 01/06/2011  Sig Time: 19:29:51  Dictated By: Naoma Diener DPM  Dict Date: 12/29/2010 Dict Time: 03 51 PM    Dictation Date and Time:12/29/2010 15:51:37  Transcription Date and Time:12/29/2010 18:23:37  eScription Dictation id: [id number] Confirmation # :[id number]

## 2011-01-07 ENCOUNTER — Ambulatory Visit (HOSPITAL_BASED_OUTPATIENT_CLINIC_OR_DEPARTMENT_OTHER): Payer: Medicaid Other | Admitting: Pulmonary

## 2011-01-11 ENCOUNTER — Ambulatory Visit (HOSPITAL_BASED_OUTPATIENT_CLINIC_OR_DEPARTMENT_OTHER): Payer: Medicaid Other

## 2011-01-11 ENCOUNTER — Telehealth (HOSPITAL_BASED_OUTPATIENT_CLINIC_OR_DEPARTMENT_OTHER): Payer: Self-pay

## 2011-01-11 NOTE — Telephone Encounter (Signed)
Pt checked in to see SW today but when SW went to find her, she was not in the reception area. Front desk staff called her and she said that she was vomiting, so left clinic to go home.  SW attempted to speak with her directly. Left vm, asking her to call back to r/s.

## 2011-01-12 ENCOUNTER — Ambulatory Visit (HOSPITAL_BASED_OUTPATIENT_CLINIC_OR_DEPARTMENT_OTHER): Payer: PRIVATE HEALTH INSURANCE | Admitting: Podiatrist

## 2011-01-12 VITALS — BP 118/96 | HR 93

## 2011-01-12 DIAGNOSIS — G9050 Complex regional pain syndrome I, unspecified: Secondary | ICD-10-CM

## 2011-01-12 DIAGNOSIS — M792 Neuralgia and neuritis, unspecified: Secondary | ICD-10-CM

## 2011-01-12 MED ORDER — ONDANSETRON HCL 8 MG PO TABS
8.0000 mg | ORAL_TABLET | Freq: Two times a day (BID) | ORAL | Status: AC | PRN
Start: 2011-01-12 — End: 2011-01-26

## 2011-01-12 MED ORDER — ZOLPIDEM TARTRATE 5 MG PO TABS
5.0000 mg | ORAL_TABLET | Freq: Every evening | ORAL | Status: DC | PRN
Start: 2011-01-12 — End: 2011-01-28

## 2011-01-12 NOTE — Progress Notes (Signed)
S: The patient presents to the office today for followup for reflex sympathetic dystrophy. She had a PT block performed to right foot last week and she indicates that the block has helped alleviate her symptoms but has now worn off and has pins and needle sensation to her foot. Pt relates that her pain is worse at night when she moves her right foot.  Pt is going to be leaving for cruise to [place] in the near future.     PMHx/ Meds:  No changes as reviewed in EPIC.     ROS: Complains of Nausea and paresthesia to the right foot     Physical Exam:  General: Pleasant female who is awake, alert and oriented in no distress  Derm: Skin supple, warm, no mottling visualized.   Vasc: Palpable pedal pulses with capillary refill time less than 3 sec to toes. Hair present to digits. Mild swelling to the dorsum of the right foot.  Neuro: Sensation grossly intact. Hypersensitivity to light touch, right foot.  MSK: Pain to palpation over the foot to light touch    ASSESSMENT AND PLAN:   RSD, s/p trauma to right foot  Examined Pt and performed PT  block consisting of 0.25 mL of dexamethasone 4 mg/mL and 5 mL of plain bupivacaine. Pt tolerated the injection well without any complications noted. Pt is to f/u on Thurs for evaluation.

## 2011-01-14 ENCOUNTER — Ambulatory Visit (HOSPITAL_BASED_OUTPATIENT_CLINIC_OR_DEPARTMENT_OTHER): Payer: PRIVATE HEALTH INSURANCE | Admitting: Podiatrist

## 2011-01-14 DIAGNOSIS — M792 Neuralgia and neuritis, unspecified: Secondary | ICD-10-CM

## 2011-01-14 DIAGNOSIS — G9050 Complex regional pain syndrome I, unspecified: Secondary | ICD-10-CM

## 2011-01-14 DIAGNOSIS — S9030XA Contusion of unspecified foot, initial encounter: Secondary | ICD-10-CM

## 2011-01-14 NOTE — Progress Notes (Signed)
SUBJECTIVE: Madison Mckay is a 40 year old female who presents back to clinic today for continued treatment for reflex sympathetic dystrophy of her right foot. Patient presents ambulating in a short leg air CAM walker to the right foot. Patient was last seen on 01/12/2011 where she had a PT block performed to the right foot. Patient indicated that the block helped alleviate her shooting pains and now only has pins and needle sensations to her foot. Patient will be leaving for a cruise to [place] tomorrow and would like another injection today to help relieve her neurological pain. Patient would also like some sort of supportive material to wear on her right foot so she can wear sneakers occasionally during her trip.    PMH: Significant for HTN, hypothyroidism, anxiety, depression, asthma, edometriosis, rosacea, shingles and RSD of the lower limb secondary to crush injury on the right foot (1 year ago)    Medications and Allergies: No change as reviewed in EPIC since her last appointment.    ROS: No nausea, vomiting, chills or fever reported. All review of symptoms are unremarkable except for her chief complaint.    OBJECTIVE:  - General: Madison Mckay is a 40 year old female who is awake and alert x 3 and in no acute distress  - Dermatology: Temperature and turgor within normal limits. No open lesions. All interdigital spaces are clean and intact with no signs of maceration.  - Vascular: Palpable pedal pulses bilaterally. Normal hair growth. No signs of venous insufficiency noted. No pedal edema.  - Musculoskeletal: Pain to palpation over the foot to light touch.  - Neurological: Patient states of paresthesias to the right foot. Sensation grossly intact. Hypersensitive to light touch to her right foot.    ASSESSMENT:  - Madison Mckay is a 40 year old female presents with reflex sympathetic dystrophic secondary to s/p 1 year right foot trauma.    PLAN:  - Patient was managed, evaluated and treated with all  questions addressed.  - Performed PT block consisting of 0.25 mL of dexamethasone 4 mg/mL and 5 mL of plain bupivacaine in a 5 cc syringe. Pt tolerated the injection well without any complications noted.   - Dispensed silicone metatarsal sleeve.   - Will follow up after her trip.

## 2011-01-26 ENCOUNTER — Ambulatory Visit (HOSPITAL_BASED_OUTPATIENT_CLINIC_OR_DEPARTMENT_OTHER): Payer: Medicaid Other | Admitting: Podiatrist

## 2011-01-26 DIAGNOSIS — S9030XA Contusion of unspecified foot, initial encounter: Secondary | ICD-10-CM

## 2011-01-26 DIAGNOSIS — M792 Neuralgia and neuritis, unspecified: Secondary | ICD-10-CM

## 2011-01-26 DIAGNOSIS — G9050 Complex regional pain syndrome I, unspecified: Secondary | ICD-10-CM

## 2011-01-26 NOTE — Progress Notes (Signed)
SUBJECTIVE: Madison Mckay is a 40 year old female who presents back to clinic today for continued treatment for reflex sympathetic dystrophy of her right foot. Patient presents ambulating in a short leg air CAM walker to the right foot. Patient was last seen on 01/14/2011 where she had a PT block performed to the right foot. Patient indicated that the block helped alleviate her shooting pains and now only has pins and needle sensations to her foot. Patient just recently returned from a cruise to [place]. Patient states she ended up not wearing a sneaker at all during the trip and struggled at time to keep up with the group while walking. Patient states her foot at the end of the day got moderately swollen and sore.  PMH, Meds, Allergies reviewed. Patient has had some exacerbations of her asthma and has been on prednisone, which she only recently stopped.   OBJECTIVE: Right lower extremity exam focused  - Gen: A&Ox3, NAD   - Vascular: Palpable pedal pulses.  - Right LE: Discoloration and skin mottling has significant improved. She continues to have tenderness with light touch to the dorsum of the foot.   ASSESSMENT and PLAN: Reflex Sympathetic Dystrophy, right foot   - A PT block consisting of 5cc 0.5% Marcaine pl and 0.25 cc Dexamethasone 4mg /mL was administered in a 5 cc syringe with a 27 gauge 1 1/4 needle. Patient tolerated procedure well with no complications.  - Continue wearing the short leg air CAM boot on the right foot.  - Follow-up on Thursday for continued serial injection therapy.

## 2011-01-28 ENCOUNTER — Ambulatory Visit (HOSPITAL_BASED_OUTPATIENT_CLINIC_OR_DEPARTMENT_OTHER): Payer: Medicaid Other | Admitting: Podiatrist

## 2011-01-28 DIAGNOSIS — G9050 Complex regional pain syndrome I, unspecified: Secondary | ICD-10-CM

## 2011-01-28 DIAGNOSIS — R252 Cramp and spasm: Secondary | ICD-10-CM

## 2011-01-28 MED ORDER — ZOLPIDEM TARTRATE 10 MG PO TABS
10.0000 mg | ORAL_TABLET | Freq: Every evening | ORAL | Status: DC | PRN
Start: 2011-01-28 — End: 2011-02-16

## 2011-01-28 MED ORDER — DIAZEPAM 5 MG PO TABS
5.0000 mg | ORAL_TABLET | Freq: Two times a day (BID) | ORAL | Status: DC | PRN
Start: 2011-01-28 — End: 2011-02-25

## 2011-01-29 ENCOUNTER — Other Ambulatory Visit (HOSPITAL_BASED_OUTPATIENT_CLINIC_OR_DEPARTMENT_OTHER): Payer: Self-pay | Admitting: Pulmonary

## 2011-01-29 DIAGNOSIS — F419 Anxiety disorder, unspecified: Secondary | ICD-10-CM

## 2011-01-29 DIAGNOSIS — J383 Other diseases of vocal cords: Secondary | ICD-10-CM

## 2011-01-29 DIAGNOSIS — J45909 Unspecified asthma, uncomplicated: Secondary | ICD-10-CM

## 2011-01-29 NOTE — Progress Notes (Signed)
Pt seen with [facility].  I concur with their assessment, and confirm that this treatment plan is correct.    PT block administered to the medial right ankle region.  Pt tolerated injection without any complications.

## 2011-01-29 NOTE — Progress Notes (Signed)
Showed up in lab yesterday for PFTs but no order.   Looks as [employer] [employer] was one for June which was listed as cancelled, per Clydie Braun.  Will re-order now

## 2011-02-01 NOTE — Progress Notes (Signed)
Slight improvement in symptoms from RSD since last visit.  Will administer a PT block today with 5 cc bupivicaine and 1/4 cc dexamethasone.    Pt tolerated injection well without complications.  F/U next week.

## 2011-02-02 ENCOUNTER — Ambulatory Visit (HOSPITAL_BASED_OUTPATIENT_CLINIC_OR_DEPARTMENT_OTHER): Payer: Medicaid Other | Admitting: Podiatrist

## 2011-02-02 DIAGNOSIS — G9050 Complex regional pain syndrome I, unspecified: Secondary | ICD-10-CM

## 2011-02-02 NOTE — Progress Notes (Signed)
This office note has been dictated. Account number [id number]

## 2011-02-04 NOTE — Progress Notes (Signed)
Pt seen with [facility].  I concur with their assessment, and confirm that this treatment plan is correct.

## 2011-02-04 NOTE — Progress Notes (Signed)
Patient was seen with resident Dr. Shaper.  I concur with their assessment and treatment plan.

## 2011-02-09 ENCOUNTER — Ambulatory Visit (HOSPITAL_BASED_OUTPATIENT_CLINIC_OR_DEPARTMENT_OTHER): Payer: Medicaid Other | Admitting: Podiatrist

## 2011-02-09 DIAGNOSIS — G9050 Complex regional pain syndrome I, unspecified: Secondary | ICD-10-CM

## 2011-02-09 DIAGNOSIS — M79673 Pain in unspecified foot: Secondary | ICD-10-CM

## 2011-02-09 LAB — XR FOOT RIGHT MINIMUM 3 VIEWS

## 2011-02-09 MED ORDER — TAPENTADOL HCL 75 MG PO TABS
1.0000 | ORAL_TABLET | Freq: Four times a day (QID) | ORAL | Status: DC | PRN
Start: 2011-02-09 — End: 2011-03-11

## 2011-02-09 NOTE — Progress Notes (Signed)
This office note has been dictated. Account number [id number]

## 2011-02-11 ENCOUNTER — Ambulatory Visit (HOSPITAL_BASED_OUTPATIENT_CLINIC_OR_DEPARTMENT_OTHER): Payer: Medicaid Other | Admitting: Podiatrist

## 2011-02-11 DIAGNOSIS — G9050 Complex regional pain syndrome I, unspecified: Secondary | ICD-10-CM

## 2011-02-11 MED ORDER — GABAPENTIN 300 MG PO CAPS
300.0000 mg | ORAL_CAPSULE | Freq: Three times a day (TID) | ORAL | Status: DC
Start: 2011-02-11 — End: 2011-03-13

## 2011-02-11 NOTE — Progress Notes (Signed)
Patient was seen with resident Dr. Buchanan.  I concur with their assessment and treatment plan.

## 2011-02-11 NOTE — Progress Notes (Addendum)
S: 40 year old female following up Reflex sympathetic dystrophy right foot. She was here 2 days ago and received a posterior tibial nerve block- she states that it only lasted for 24 hours and her pain returned. She is concerned because the mottled apeparance of her foot is returning. She is taking Nucynta for pain relief but states that it doesn't do much for her other than make her constipated. She was told at her last appointment that she may benefit from more proximal injections and she is a bit wary about this. The only time she has had significant pain relief was through PO and IV steroids, however she has significant side effects from high dose steroids and she does not wish to be on them anymore. She has noticed atrophy of her leg muscles on the right foot as well. She had x-rays at her previous appointment which were normal.    EXAM:  Gen: NAD, A&Ox3  Right foot: Pedal pulses palpable 2/4. Foot is warm to cool at toes. There is erythema to the dorsal aspect of her forefoot and her skin is hypersensitive to touch. There is slight musculature atrophy at the level of the ankle joint compared to the left. She has pain out of proportion on palpation of her forefoot.    ASSESSMENT: Reflex Sympathetic Dystrophy Right foot    PLAN:  1. After confirming patient's name and date of birth 2 injections were performed under aseptic technique on the right lower extremity:      1. Common peroneal block 3 cc 0.5% Marcaine + 0.5 cc Dexamethasone phosphate 4 mg/mL     2. Tibial nerve block:3 cc 0.5% Marcaine + 0.5 cc Dexamethasone phosphate 4 mg/mL  2. Dispensed CAM boot to provide relief   3. Patient to follow up next week for subsequent nerve block  4. Encouraged her again to follow up with pain management.  5. Patient requested Gabapentin Rx which was called into her pharmacy for pain relief.

## 2011-02-15 ENCOUNTER — Other Ambulatory Visit (HOSPITAL_BASED_OUTPATIENT_CLINIC_OR_DEPARTMENT_OTHER): Payer: Self-pay | Admitting: Podiatrist

## 2011-02-16 ENCOUNTER — Ambulatory Visit (HOSPITAL_BASED_OUTPATIENT_CLINIC_OR_DEPARTMENT_OTHER): Payer: PRIVATE HEALTH INSURANCE | Admitting: Podiatrist

## 2011-02-16 DIAGNOSIS — S9030XA Contusion of unspecified foot, initial encounter: Secondary | ICD-10-CM

## 2011-02-16 DIAGNOSIS — G9050 Complex regional pain syndrome I, unspecified: Secondary | ICD-10-CM

## 2011-02-16 DIAGNOSIS — M79673 Pain in unspecified foot: Secondary | ICD-10-CM

## 2011-02-16 MED ORDER — ZOLPIDEM TARTRATE 10 MG PO TABS
10.0000 mg | ORAL_TABLET | Freq: Every evening | ORAL | Status: AC | PRN
Start: 2011-02-16 — End: 2011-03-18
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
## 2011-02-18 ENCOUNTER — Ambulatory Visit (HOSPITAL_BASED_OUTPATIENT_CLINIC_OR_DEPARTMENT_OTHER): Payer: PRIVATE HEALTH INSURANCE | Admitting: Podiatrist

## 2011-02-18 DIAGNOSIS — G9050 Complex regional pain syndrome I, unspecified: Secondary | ICD-10-CM

## 2011-02-22 NOTE — Progress Notes (Signed)
Pt continues to have pain in her right limb, and appears to be only getting 2-3 days relief, following common peroneal and PT nerve blocks.  Today, I have repeated the blocks.  She will f/u with [place] in 5 days.    Blocks were each performed with1/2 cc dexamethasone, and 5 cc of bupivicaine.    Pt tolerated injections with no complications noted.

## 2011-02-23 ENCOUNTER — Ambulatory Visit (HOSPITAL_BASED_OUTPATIENT_CLINIC_OR_DEPARTMENT_OTHER): Payer: PRIVATE HEALTH INSURANCE | Admitting: Podiatrist

## 2011-02-23 DIAGNOSIS — G9050 Complex regional pain syndrome I, unspecified: Secondary | ICD-10-CM

## 2011-02-23 NOTE — Progress Notes (Signed)
Pt seen with [facility] St[facility].  I concur with their assessment, and confirm that this treatment plan is correct.

## 2011-02-24 ENCOUNTER — Ambulatory Visit (HOSPITAL_BASED_OUTPATIENT_CLINIC_OR_DEPARTMENT_OTHER): Payer: Medicaid Other | Admitting: Otolaryngology

## 2011-02-25 ENCOUNTER — Ambulatory Visit (HOSPITAL_BASED_OUTPATIENT_CLINIC_OR_DEPARTMENT_OTHER): Payer: Medicaid Other | Admitting: Podiatrist

## 2011-02-25 DIAGNOSIS — S9030XA Contusion of unspecified foot, initial encounter: Secondary | ICD-10-CM

## 2011-02-25 DIAGNOSIS — G9050 Complex regional pain syndrome I, unspecified: Secondary | ICD-10-CM

## 2011-02-25 MED ORDER — DIAZEPAM 5 MG PO TABS
5.0000 mg | ORAL_TABLET | Freq: Two times a day (BID) | ORAL | Status: DC | PRN
Start: 2011-02-25 — End: 2011-03-18

## 2011-02-25 NOTE — Progress Notes (Signed)
A 40 year old female returns to clinic for a follow up on RSD.     The patient stated she has been suffering from RSD since 2011. Her condition started after a 125 lbs shelf fell on her R foot. The patient presents to clinic twice a week on Tuesday and Thursday for PT and common peroneal nerve blocks. She stated the blocks relieve her pain for 3 days. She stated after 3 days she is in severe pain and has difficulty ambulating. She stated she usually wears a CAM boot to relieve her pain.     Today the patient describes her pain as pins and needles on the dorsum and plantar aspect of metatarsal bases. She stated she shooting pain to her thigh upon pronation. The shooting pain used to stop at the knee level, but recently has been ascending to the thigh. She denies fever, chills, nausea, vomiting and shortness of breath.       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 Disp: 60 tablet Rfl: 0   zolpidem (AMBIEN) 10 MG TABS Take 1 tablet by mouth nightly as needed. Disp: 30 tablet Rfl: 0   gabapentin (NEURONTIN) 300 MG capsule Take 1 capsule by mouth 3 (three) times daily. Disp: 90 capsule Rfl: 2   Tapentadol HCl (NUCYNTA) 75 MG TABS Take 1 tablet by mouth 4 (four) times daily as needed. Disp: 60 tablet Rfl: 0   DISCONTD: diazepam (VALIUM) 5 MG tablet Take 1 tablet by mouth every 12 (twelve) hours as needed for Anxiety. Disp: 60 tablet Rfl: 0   fluticasone-salmeterol (ADVAIR DISKUS) 500-50 MCG/DOSE  AEPB Aerosol Powder Inhale 1 puff into the lungs every 12 (twelve) hours. Disp: 180 each Rfl: 4   ipratropium-albuterol (DUO-NEB) 0.5-2.5 (3) MG/3ML SOLN Inhalation Solution Take 3 mLs by nebulization 4 (four) times daily as needed. Disp: 1000 mL Rfl: 11   buPROPion (WELLBUTRIN XL) 300 MG 24 hr tablet Take 1 tablet by mouth daily. Disp: 90 tablet Rfl: 4   albuterol (PROAIR HFA) 108 (90 BASE) MCG/ACT inhaler Inhale 2 puffs into the lungs every 6 (six) hours as needed for Wheezing. Disp: 1 Inhaler Rfl: 3   hydrochlorothiazide (HYDRODIURIL) 25 MG tablet Take 1 tablet by mouth daily. Disp: 90 tablet Rfl: 4   amlodipine (NORVASC) 5 MG tablet Take 1 tablet by mouth daily. Disp: 90 tablet Rfl: 4   Multiple Vitamin (MULTIVITAMIN) tablet Take 1 tablet by mouth daily. Disp: 30 tablet Rfl: 12   QUEtiapine (SEROQUEL) 25 MG tablet Take  by mouth. Take 2 tablets by mouth qhs. Then take 1 tablet bid prn anxiety. Disp: 90 tablet Rfl: 0   furosemide (LASIX) 20 MG tablet Take 1 tablet by mouth 2 (two) times daily. Disp: 60 tablet Rfl: 1   traMADol (ULTRAM) 50 MG tablet Take  1 tablet by mouth every 6 (six) hours as needed. Disp: 30 tablet Rfl: 0   albuterol (PROVENTIL) (2.5 MG/3ML) 0.083% nebulizer solution Take 3 mLs by nebulization every 6 (six) hours as needed for Wheezing. Disp: 75 mL Rfl: 4   hydrOXYzine (ATARAX) 25 MG tablet Take 1 tablet by mouth every 8 (eight) hours as needed for Itching and Anxiety. Disp: 30 tablet Rfl: 0   Spacer/Aero-Holding Chambers (AEROCHAMBER MAX W/MASK MEDIUM) device Use as instructed Disp: 1 Device Rfl: 0   Nebulizer MISC 1 kit by Does not apply route as needed. Disp: 1 each Rfl: 0   SYNTHROID 125 MCG tablet Take 1 tablet by mouth every morning. Disp: 90 tablet Rfl: 4   EPINEPHrine (EPIPEN 2-PAK) 0.3 MG/0.3ML Injection Device Inject 0.3 mg into the muscle once as needed. Disp: 1 each Rfl: 1   clindamycin-benzoyl peroxide (BENZACLIN) gel Apply  topically 2 (two) times daily. Disp: 50 g Rfl: 4    lidocaine (LIDODERM) 5 % patch Place 1 patch onto the skin daily. Disp: 90 patch Rfl: 4       Review of Patient's Allergies indicates:   Penicillins             Hives   Oxycodone                   Comment:Hives, other narcotics OK   Erythromycin            Nausea Only    Comment:Stomach pain severe no nausea, can take             azithromycin    LE exam: R foot  -Vascular: DP 2/4, PT 2/4, CFT 2 sec x 10, Temp gradient: WNL   - Neuro: gross sensation intact  - Derm: no edema, no erythema, no open lesions, no signs of infection, no interdigital maceration, pain upon palpation of the met bases plantar and dorsally.     Assessment: RSD     Plan:   - Patient evaluated and chart reviewed  - PT nerve block 5cc of Marcaine (Exp. Date 10/18)  - Common Peroneal nerve block 5cc of Marcaine (Exp. Date 10/18)  - Patient tolerated the procedure well.   - Rx: Diazepam 5mg  tablet   - Patient to return to clinic on Tuesday for further treatment.

## 2011-02-28 NOTE — Progress Notes (Signed)
Pt seen with [facility] Madison Mckay.  I concur with their assessment, and confirm that this treatment plan is correct.

## 2011-03-01 NOTE — Progress Notes (Signed)
Date of Service: 02/09/2011    SUBJECTIVE:  The patient presents to the office today for followup for reflex sympathetic dystrophy in her right foot region.  She indicates that since her last visit, her pain has been substantially greater than on previous visits.  She indicates that it got to the point where she was unable to tolerate the surgical shoe as well.    OBJECTIVE:  Clinical examination today does reveal some slight increase in redness and mottling of the skin.  I have sent her for new x-rays today to evaluate her for osteoporotic changes and none were apparent.   This was based on evaluation of AP, lateral, and oblique radiographs.  In addition, I have performed a posterior tibial nerve block with 0.25 mL of dexamethasone 4 mg/mL, and 5 mL of local anesthetic.  She tolerated the injection well without any complications noted.  I have also switched her to a Nucynta 75 mg p.r.n. dosing regimen instead of the extended release because the patient indicates that it took a very long time for the extended release formulation to kick in.    PLAN:  For her to follow up with [place] later this week for an additional injection.    ___________________________  Reviewed and Electronically Signed By: Naoma Diener DPM  Sig Date: 03/01/2011  Sig Time: 09:29:00  Dictated By: Naoma Diener DPM  Dict Date: 02/09/2011 Dict Time: 12 50 PM    Dictation Date and Time:02/09/2011 12:50:21  Transcription Date and Time:02/09/2011 14:20:33  eScription Dictation id: [id number] Confirmation # :[id number]

## 2011-03-01 NOTE — Progress Notes (Signed)
This office note has been dictated. Account number [id number]

## 2011-03-01 NOTE — Progress Notes (Signed)
Date of Service: 02/23/2011    SUBJECTIVE:  The patient presented to the office today for followup for reflex sympathetic dystrophy in her right lower extremity.  She continues to show minimal improvement since her last visit.    ASSESSMENT AND PLAN:  We performed a PT block and a common peroneal block on the right limbs with 0.25 mL of dexamethasone added to the infection site, along with 5 mL of bupivacaine.  Plan is for her to follow up with [place] in approximately 5 days.    ___________________________  Reviewed and Electronically Signed By: Naoma Diener DPM  Sig Date: 03/01/2011  Sig Time: 09:31:41  Dictated By: Naoma Diener DPM  Dict Date: 03/01/2011 Dict Time: 12 01 AM    Dictation Date and Time:03/01/2011 00:01:00  Transcription Date and Time:03/01/2011 00:13:34  eScription Dictation id: [id number] Confirmation # :[id number]

## 2011-03-01 NOTE — Progress Notes (Signed)
Date of Service: 02/02/2011    SUBJECTIVE:  The patient presents to the office today for followup for reflex sympathetic dystrophy.  She continues to show signs of improvement.  She denies fever, chills, nausea, vomiting, night sweats, and shortness of breath.    PLAN:  Based on clinical examination today, I have decided to reduce the number of injections to once a week, and we will give her an injection today without any dexamethasone and see how she responds to this treatment.  Plan is for her to follow up with [place] in 1 week.  I have administered a posterior tibial nerve block today for her, follow up as indicated.    ___________________________  Reviewed and Electronically Signed By: Naoma Diener DPM  Sig Date: 03/01/2011  Sig Time: 09:27:53  Dictated By: Naoma Diener DPM  Dict Date: 02/02/2011 Dict Time: 09 11 PM    Dictation Date and Time:02/02/2011 21:11:42  Transcription Date and Time:02/02/2011 21:30:26  eScription Dictation id: [id number] Confirmation # :[id number]

## 2011-03-02 ENCOUNTER — Ambulatory Visit (HOSPITAL_BASED_OUTPATIENT_CLINIC_OR_DEPARTMENT_OTHER): Payer: PRIVATE HEALTH INSURANCE | Admitting: Podiatrist

## 2011-03-02 DIAGNOSIS — G9050 Complex regional pain syndrome I, unspecified: Secondary | ICD-10-CM

## 2011-03-04 ENCOUNTER — Ambulatory Visit (HOSPITAL_BASED_OUTPATIENT_CLINIC_OR_DEPARTMENT_OTHER): Payer: PRIVATE HEALTH INSURANCE | Admitting: Podiatrist

## 2011-03-04 DIAGNOSIS — G9050 Complex regional pain syndrome I, unspecified: Secondary | ICD-10-CM

## 2011-03-05 NOTE — Progress Notes (Signed)
A 40 year old female returns to clinic for a follow up on RSD.   The patient stated she has been suffering from RSD since 2011. Her condition started after a 125 lbs shelf fell on her R foot. The patient presents to clinic twice a week on Tuesday and Thursday for PT and common peroneal nerve blocks. She stated the blocks relieve her pain for 3 days. She stated after 3 days she is in severe pain and has difficulty ambulating. She stated she usually wears a CAM boot to relieve her pain.   Today the patient describes her pain as pins and needles on the dorsum and plantar aspect of metatarsal bases. She stated she shooting pain to her thigh upon pronation. The shooting pain used to stop at the knee level, but recently has been ascending to the thigh. She denies fever, chills, nausea, vomiting and shortness of breath.     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  Disp: 60 tablet  Rfl: 0    zolpidem (AMBIEN) 10 MG TABS  Take 1 tablet by mouth nightly as needed.  Disp: 30 tablet  Rfl: 0    gabapentin (NEURONTIN) 300 MG capsule  Take 1 capsule by mouth 3 (three) times daily.  Disp: 90 capsule  Rfl: 2    Tapentadol HCl (NUCYNTA) 75 MG TABS  Take 1 tablet by mouth 4 (four) times daily as needed.  Disp: 60 tablet  Rfl: 0    DISCONTD: diazepam (VALIUM) 5 MG tablet  Take 1 tablet by mouth every 12 (twelve) hours as needed for Anxiety.  Disp: 60 tablet  Rfl: 0     fluticasone-salmeterol (ADVAIR DISKUS) 500-50 MCG/DOSE AEPB Aerosol Powder  Inhale 1 puff into the lungs every 12 (twelve) hours.  Disp: 180 each  Rfl: 4    ipratropium-albuterol (DUO-NEB) 0.5-2.5 (3) MG/3ML SOLN Inhalation Solution  Take 3 mLs by nebulization 4 (four) times daily as needed.  Disp: 1000 mL  Rfl: 11    buPROPion (WELLBUTRIN XL) 300 MG 24 hr tablet  Take 1 tablet by mouth daily.  Disp: 90 tablet  Rfl: 4    albuterol (PROAIR HFA) 108 (90 BASE) MCG/ACT inhaler  Inhale 2 puffs into the lungs every 6 (six) hours as needed for Wheezing.  Disp: 1 Inhaler  Rfl: 3    hydrochlorothiazide (HYDRODIURIL) 25 MG tablet  Take 1 tablet by mouth daily.  Disp: 90 tablet  Rfl: 4    amlodipine (NORVASC) 5 MG tablet  Take 1 tablet by mouth daily.  Disp: 90 tablet  Rfl: 4    Multiple Vitamin (  MULTIVITAMIN) tablet  Take 1 tablet by mouth daily.  Disp: 30 tablet  Rfl: 12    QUEtiapine (SEROQUEL) 25 MG tablet  Take by mouth. Take 2 tablets by mouth qhs. Then take 1 tablet bid prn anxiety.  Disp: 90 tablet  Rfl: 0    furosemide (LASIX) 20 MG tablet  Take 1 tablet by mouth 2 (two) times daily.  Disp: 60 tablet  Rfl: 1    traMADol (ULTRAM) 50 MG tablet  Take 1 tablet by mouth every 6 (six) hours as needed.  Disp: 30 tablet  Rfl: 0    albuterol (PROVENTIL) (2.5 MG/3ML) 0.083% nebulizer solution  Take 3 mLs by nebulization every 6 (six) hours as needed for Wheezing.  Disp: 75 mL  Rfl: 4    hydrOXYzine (ATARAX) 25 MG tablet  Take 1 tablet by mouth every 8 (eight) hours as needed for Itching and Anxiety.  Disp: 30 tablet  Rfl: 0    Spacer/Aero-Holding Chambers (AEROCHAMBER MAX W/MASK MEDIUM) device  Use as instructed  Disp: 1 Device  Rfl: 0    Nebulizer MISC  1 kit by Does not apply route as needed.  Disp: 1 each  Rfl: 0    SYNTHROID 125 MCG tablet  Take 1 tablet by mouth every morning.  Disp: 90 tablet  Rfl: 4    EPINEPHrine (EPIPEN 2-PAK) 0.3 MG/0.3ML Injection Device  Inject 0.3 mg into the muscle once as needed.  Disp: 1 each   Rfl: 1    clindamycin-benzoyl peroxide (BENZACLIN) gel  Apply topically 2 (two) times daily.  Disp: 50 g  Rfl: 4    lidocaine (LIDODERM) 5 % patch  Place 1 patch onto the skin daily.  Disp: 90 patch  Rfl: 4      Review of Patient's Allergies indicates:   Penicillins Hives   Oxycodone   Comment:Hives, other narcotics OK   Erythromycin Nausea Only   Comment:Stomach pain severe no nausea, can take   azithromycin   LE exam: R foot   -Vascular: DP 2/4, PT 2/4, CFT 2 sec x 10, Temp gradient: WNL   - Neuro: gross sensation intact   - Derm: no edema, no erythema, no open lesions, no signs of infection, no interdigital maceration, pain upon palpation of the met bases plantar and dorsally.   Assessment: RSD   Plan:   - Patient evaluated and chart reviewed   - PT nerve block 5cc of Marcaine (Exp. Date 10/18)   - Common Peroneal nerve block 5cc of Marcaine (Exp. Date 10/18)   - Patient tolerated the procedure well.   - Patient to return to clinic for further treatment.

## 2011-03-06 NOTE — Progress Notes (Signed)
A 39 year old female returns to clinic for a follow up on RSD.   The patient stated she has been suffering from RSD since 2011. Her condition started after a 125 lbs shelf fell on her R foot. The patient presents to clinic twice a week on Tuesday and Thursday for PT and common peroneal nerve blocks. She stated the blocks relieve her pain for 3 days. She stated after 3 days she is in severe pain and has difficulty ambulating. She stated she usually wears a CAM boot to relieve her pain.   Today the patient describes her pain as pins and needles on the dorsum and plantar aspect of metatarsal bases. She stated she shooting pain to her thigh upon pronation. The shooting pain used to stop at the knee level, but recently has been ascending to the thigh. She denies fever, chills, nausea, vomiting and shortness of breath.     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  Disp: 60 tablet  Rfl: 0    zolpidem (AMBIEN) 10 MG TABS  Take 1 tablet by mouth nightly as needed.  Disp: 30 tablet  Rfl: 0    gabapentin (NEURONTIN) 300 MG capsule  Take 1 capsule by mouth 3 (three) times daily.  Disp: 90 capsule  Rfl: 2    Tapentadol HCl (NUCYNTA) 75 MG TABS  Take 1 tablet by mouth 4 (four) times daily as needed.  Disp: 60 tablet  Rfl: 0    DISCONTD: diazepam (VALIUM) 5 MG tablet  Take 1 tablet by mouth every 12 (twelve) hours as needed for Anxiety.  Disp: 60 tablet  Rfl: 0     fluticasone-salmeterol (ADVAIR DISKUS) 500-50 MCG/DOSE AEPB Aerosol Powder  Inhale 1 puff into the lungs every 12 (twelve) hours.  Disp: 180 each  Rfl: 4    ipratropium-albuterol (DUO-NEB) 0.5-2.5 (3) MG/3ML SOLN Inhalation Solution  Take 3 mLs by nebulization 4 (four) times daily as needed.  Disp: 1000 mL  Rfl: 11    buPROPion (WELLBUTRIN XL) 300 MG 24 hr tablet  Take 1 tablet by mouth daily.  Disp: 90 tablet  Rfl: 4    albuterol (PROAIR HFA) 108 (90 BASE) MCG/ACT inhaler  Inhale 2 puffs into the lungs every 6 (six) hours as needed for Wheezing.  Disp: 1 Inhaler  Rfl: 3    hydrochlorothiazide (HYDRODIURIL) 25 MG tablet  Take 1 tablet by mouth daily.  Disp: 90 tablet  Rfl: 4    amlodipine (NORVASC) 5 MG tablet  Take 1 tablet by mouth daily.  Disp: 90 tablet  Rfl: 4    Multiple Vitamin (  MULTIVITAMIN) tablet  Take 1 tablet by mouth daily.  Disp: 30 tablet  Rfl: 12    QUEtiapine (SEROQUEL) 25 MG tablet  Take by mouth. Take 2 tablets by mouth qhs. Then take 1 tablet bid prn anxiety.  Disp: 90 tablet  Rfl: 0    furosemide (LASIX) 20 MG tablet  Take 1 tablet by mouth 2 (two) times daily.  Disp: 60 tablet  Rfl: 1    traMADol (ULTRAM) 50 MG tablet  Take 1 tablet by mouth every 6 (six) hours as needed.  Disp: 30 tablet  Rfl: 0    albuterol (PROVENTIL) (2.5 MG/3ML) 0.083% nebulizer solution  Take 3 mLs by nebulization every 6 (six) hours as needed for Wheezing.  Disp: 75 mL  Rfl: 4    hydrOXYzine (ATARAX) 25 MG tablet  Take 1 tablet by mouth every 8 (eight) hours as needed for Itching and Anxiety.  Disp: 30 tablet  Rfl: 0    Spacer/Aero-Holding Chambers (AEROCHAMBER MAX W/MASK MEDIUM) device  Use as instructed  Disp: 1 Device  Rfl: 0    Nebulizer MISC  1 kit by Does not apply route as needed.  Disp: 1 each  Rfl: 0    SYNTHROID 125 MCG tablet  Take 1 tablet by mouth every morning.  Disp: 90 tablet  Rfl: 4    EPINEPHrine (EPIPEN 2-PAK) 0.3 MG/0.3ML Injection Device  Inject 0.3 mg into the muscle once as needed.  Disp: 1 each   Rfl: 1    clindamycin-benzoyl peroxide (BENZACLIN) gel  Apply topically 2 (two) times daily.  Disp: 50 g  Rfl: 4    lidocaine (LIDODERM) 5 % patch  Place 1 patch onto the skin daily.  Disp: 90 patch  Rfl: 4      Review of Patient's Allergies indicates:   Penicillins Hives   Oxycodone   Comment:Hives, other narcotics OK   Erythromycin Nausea Only   Comment:Stomach pain severe no nausea, can take   azithromycin   LE exam: R foot   -Vascular: DP 2/4, PT 2/4, CFT 2 sec x 10, Temp gradient: WNL   - Neuro: gross sensation intact   - Derm: no edema, no erythema, no open lesions, no signs of infection, no interdigital maceration, pain upon palpation of the met bases plantar and dorsally.   Assessment: RSD   Plan:   - Patient evaluated and chart reviewed   - PT nerve block 5cc of Marcaine (Exp. Date 10/18)   - Common Peroneal nerve block 5cc of Marcaine (Exp. Date 10/18)   - Patient tolerated the procedure well.   - Patient to return to clinic for further treatment.

## 2011-03-09 ENCOUNTER — Ambulatory Visit (HOSPITAL_BASED_OUTPATIENT_CLINIC_OR_DEPARTMENT_OTHER): Payer: Medicaid Other | Admitting: Podiatrist

## 2011-03-09 DIAGNOSIS — G9050 Complex regional pain syndrome I, unspecified: Secondary | ICD-10-CM

## 2011-03-10 ENCOUNTER — Encounter (HOSPITAL_BASED_OUTPATIENT_CLINIC_OR_DEPARTMENT_OTHER): Payer: Self-pay | Admitting: Podiatrist

## 2011-03-11 ENCOUNTER — Ambulatory Visit (HOSPITAL_BASED_OUTPATIENT_CLINIC_OR_DEPARTMENT_OTHER): Payer: Medicaid Other | Admitting: Podiatrist

## 2011-03-11 DIAGNOSIS — G9050 Complex regional pain syndrome I, unspecified: Secondary | ICD-10-CM

## 2011-03-11 MED ORDER — TAPENTADOL HCL 75 MG PO TABS
1.0000 | ORAL_TABLET | Freq: Four times a day (QID) | ORAL | Status: DC | PRN
Start: 2011-03-11 — End: 2011-03-30

## 2011-03-11 NOTE — Progress Notes (Signed)
S: 40 year old female following up Reflex sympathetic dystrophy right foot. She has been receiving twice weekly nerve blocks for her RSD. She states the last block was not very effective. She also states that the pain is worse today than normal. She is taking Nucynta for pain relief which is much better for her as she is not as drowsy.    EXAM:  Gen: NAD, A&Ox3, slightly tearful  Right foot: Pedal pulses palpable 2/4. Foot is warm to cool at toes. There is erythema to the dorsal aspect of her forefoot and her skin is hypersensitive to touch. There is slight musculature atrophy at the level of the ankle joint compared to the left. She has pain out of proportion on palpation of her forefoot.    ASSESSMENT: Reflex Sympathetic Dystrophy Right foot    PLAN:  1. After confirming patient's name and date of birth 2 injections were performed under aseptic technique on the right lower extremity:      1. Common peroneal block 5 cc 0.5% Marcaine + 0.25 cc Dexamethasone phosphate 4 mg/mL     2. Tibial nerve block:5 cc 0.5% Marcaine + 0.25 cc Dexamethasone phosphate 4 mg/mL  2. Patient expereinced numbness and anterior and lateral compartment muscle weakness shortly after the injection and fell in our waiting room. She hit her right knee, but did not injure herself. She was brought back into the clinic room and was evaluated. Her vitals were stable and other than feeling upset, she denied any pain or injuries.  A short leg CAM boot was dispensed to provide stability. She demonstrated safe ambulation in the CAM boot. She will follow up next Tuesday.

## 2011-03-12 NOTE — Progress Notes (Signed)
Patient was seen with resident Dr. Buchanan.  I concur with their assessment and treatment plan.

## 2011-03-12 NOTE — Progress Notes (Signed)
This office note has been dictated. Account number [id number]

## 2011-03-16 ENCOUNTER — Ambulatory Visit (HOSPITAL_BASED_OUTPATIENT_CLINIC_OR_DEPARTMENT_OTHER): Payer: Medicaid Other | Admitting: Podiatrist

## 2011-03-16 VITALS — BP 122/82 | HR 76 | Temp 98.4°F

## 2011-03-16 DIAGNOSIS — G9050 Complex regional pain syndrome I, unspecified: Secondary | ICD-10-CM

## 2011-03-17 NOTE — Progress Notes (Signed)
This office note has been dictated. Account number [id number]

## 2011-03-18 ENCOUNTER — Ambulatory Visit (HOSPITAL_BASED_OUTPATIENT_CLINIC_OR_DEPARTMENT_OTHER): Payer: Medicaid Other | Admitting: Podiatrist

## 2011-03-18 DIAGNOSIS — M79673 Pain in unspecified foot: Secondary | ICD-10-CM

## 2011-03-18 DIAGNOSIS — G9050 Complex regional pain syndrome I, unspecified: Secondary | ICD-10-CM

## 2011-03-18 LAB — XR FOOT RIGHT MINIMUM 3 VIEWS

## 2011-03-18 MED ORDER — DIAZEPAM 5 MG PO TABS
5.0000 mg | ORAL_TABLET | Freq: Two times a day (BID) | ORAL | Status: AC | PRN
Start: 2011-03-18 — End: 2011-04-17

## 2011-03-19 NOTE — Progress Notes (Signed)
SUBJECTIVE:    Madison Mckay is a 40 year old female who presents for follow up evaluation of right lower extremity reflex sympathetic dystrophy. Her last clinical visit was 03/16/11 at which point a peroneal and tibial nerve block was performed. Also at that visit it was noted that she had increased pain and some associated ecchymosis to the 2nd 3rd and 4th toes. She returns today stating that the injections did alleviate pain temporarily, however, the discomfort has now returned. Patient states that for about the past month she now experiences the neurosensory complaints up to the level of the hip. She denies lower back pain. No history of recent trauma. She denies fevers, chills, shortness of breath, chest pain, malaise. +ve paresthesias, swelling, erythema to the right lower extremity.    MEDICATIONS:    Current outpatient prescriptions:  diazepam (VALIUM) 5 MG tablet Take 1 tablet by mouth every 12 (twelve) hours as needed (As needed for muscle cramps in leg). Disp: 60 tablet Rfl: 0   Tapentadol HCl (NUCYNTA) 75 MG TABS Take 1 tablet by mouth 4 (four) times daily as needed. Disp: 60 tablet Rfl: 0   zolpidem (AMBIEN) 10 MG TABS Take 1 tablet by mouth nightly as needed. Disp: 30 tablet Rfl: 0   gabapentin (NEURONTIN) 300 MG capsule Take 1 capsule by mouth 3 (three) times daily. Disp: 90 capsule Rfl: 2   fluticasone-salmeterol (ADVAIR DISKUS) 500-50 MCG/DOSE AEPB Aerosol Powder Inhale 1 puff into the lungs every 12 (twelve) hours. Disp: 180 each Rfl: 4   ipratropium-albuterol (DUO-NEB) 0.5-2.5 (3) MG/3ML SOLN Inhalation Solution Take 3 mLs by nebulization 4 (four) times daily as needed. Disp: 1000 mL Rfl: 11   buPROPion (WELLBUTRIN XL) 300 MG 24 hr tablet Take 1 tablet by mouth daily. Disp: 90 tablet Rfl: 4   albuterol (PROAIR HFA) 108 (90 BASE) MCG/ACT inhaler Inhale 2 puffs into the lungs every 6 (six) hours as needed for Wheezing. Disp: 1 Inhaler Rfl: 3   hydrochlorothiazide (HYDRODIURIL) 25 MG tablet  Take 1 tablet by mouth daily. Disp: 90 tablet Rfl: 4   amlodipine (NORVASC) 5 MG tablet Take 1 tablet by mouth daily. Disp: 90 tablet Rfl: 4   Multiple Vitamin (MULTIVITAMIN) tablet Take 1 tablet by mouth daily. Disp: 30 tablet Rfl: 12   QUEtiapine (SEROQUEL) 25 MG tablet Take  by mouth. Take 2 tablets by mouth qhs. Then take 1 tablet bid prn anxiety. Disp: 90 tablet Rfl: 0   furosemide (LASIX) 20 MG tablet Take 1 tablet by mouth 2 (two) times daily. Disp: 60 tablet Rfl: 1   traMADol (ULTRAM) 50 MG tablet Take 1 tablet by mouth every 6 (six) hours as needed. Disp: 30 tablet Rfl: 0   albuterol (PROVENTIL) (2.5 MG/3ML) 0.083% nebulizer solution Take 3 mLs by nebulization every 6 (six) hours as needed for Wheezing. Disp: 75 mL Rfl: 4   Spacer/Aero-Holding Chambers (AEROCHAMBER MAX W/MASK MEDIUM) device Use as instructed Disp: 1 Device Rfl: 0   Nebulizer MISC 1 kit by Does not apply route as needed. Disp: 1 each Rfl: 0   SYNTHROID 125 MCG tablet Take 1 tablet by mouth every morning. Disp: 90 tablet Rfl: 4   EPINEPHrine (EPIPEN 2-PAK) 0.3 MG/0.3ML Injection Device Inject 0.3 mg into the muscle once as needed. Disp: 1 each Rfl: 1   clindamycin-benzoyl peroxide (BENZACLIN) gel Apply  topically 2 (two) times daily. Disp: 50 g Rfl: 4   lidocaine (LIDODERM) 5 % patch Place 1 patch onto the skin daily. Disp:  90 patch Rfl: 4   hydrOXYzine (ATARAX) 25 MG tablet Take 1 tablet by mouth every 8 (eight) hours as needed for Itching and Anxiety. Disp: 30 tablet Rfl: 0       ALLERGIES:  Review of Patient's Allergies indicates:   Penicillins             Hives   Oxycodone                   Comment:Hives, other narcotics OK   Erythromycin            Nausea Only    Comment:Stomach pain severe no nausea, can take             azithromycin    OBJECTIVE:      PHYSICAL EXAM:   Constitutional: Well developed, Well nourished, No acute distress, Non-toxic appearance.   Extremities: RIGHT: dorsalis pedis and posterior tibial pulses palpable.  Capillary refill time < 3 sec x 5 toes. +blanchable erythema. Mild edema. Tenderness to palpation of the entire plantar and dorsal foot. There is an increase in temperature gradient. No ecchymosis appreciated. No breaks in the integument.    ASSESSMENT/PLAN: 40 year old female with RIGHT lower extremity RSD. Today a peroneal and tibial nerve block was performed using 2 x 5cc 0.5% marcaine plain and 0.5 cc of dexamethazone. She tolerated this procedure well with no complications encountered. Patient able to actively dorsiflex her ankle 5 minutes after the procedure. Patient placed back into CAM boot. Will follow up on 03/23/11.    Electronically signed by: Renette Butters, 03/19/2011 9:27 PM  This note is electronically signed in the electronic medical record.

## 2011-03-23 ENCOUNTER — Ambulatory Visit (HOSPITAL_BASED_OUTPATIENT_CLINIC_OR_DEPARTMENT_OTHER): Payer: Medicaid Other | Admitting: Podiatrist

## 2011-03-23 NOTE — Progress Notes (Signed)
Date of Service: 03/16/2011    SUBJECTIVE:  The patient presents to the office today for followup after nerve block of peroneal and posterior tibial nerves.  Following her last injection, she lost her balance and fell down in the  waiting room.  She was brought back to the clinical area where she indicated she was doing fine and then was able to leave.  She presents today with a foot that is slightly colder and more mottled than her last visit.  She denies fever, chills, nausea, vomiting, night sweats, and shortness of breath.  She indicates that the pain has been substantial since her last visit.    OBJECTIVE:  Further examination today reveals pain and tenderness particularly across the dorsum of the foot.  She also has some ecchymosis at the base of the second, third, and fourth toes, which was not previously present.    PLAN:  We discussed several treatment options.  She is already in a fixed ankle boot, and I do feel that my treatment would change based on radiographs, so we will defer those for the time being and may consider it later in the week when she returns, if she is still having pain.  Blocks consisting of bupivacaine plain, approximately 4.5 mL as well as a 0.5 mL of dexamethasone 4 mg per mL were administered to both the common peroneal and the posterior tibial nerves.  She tolerated the injection well without any complications noted.  We will follow up with her in approximately 2-3 days for further evaluation.    ___________________________  Reviewed and Electronically Signed By: Naoma Diener DPM  Sig Date: 03/23/2011  Sig Time: 13:26:25  Dictated By: Naoma Diener DPM  Dict Date: 03/16/2011 Dict Time: 04 34 PM    Dictation Date and Time:03/16/2011 16:34:55  Transcription Date and Time:03/16/2011 17:06:26  eScription Dictation id: [id number] Confirmation # :[id number]

## 2011-03-23 NOTE — Progress Notes (Signed)
Date of Service: 03/09/2011    SUBJECTIVE:  The patient presents to the office today for continued concerns about reflex sympathetic dystrophy.  Today we will do a common peroneal and a posterior tibial nerve block.  She indicates that her pain has been essentially unchanged since her last visit.  She denies any new traumas or contusions or history of changes in her past medical history, since her last visit.    PLAN:  We will reevaluate her in 2-3 days and repeat injections as needed.    ___________________________  Reviewed and Electronically Signed By: Naoma Diener DPM  Sig Date: 03/23/2011  Sig Time: 13:26:07  Dictated By: Naoma Diener DPM  Dict Date: 03/12/2011 Dict Time: 03 24 PM    Dictation Date and Time:03/12/2011 15:24:52  Transcription Date and Time:03/12/2011 19:56:54  eScription Dictation id: [id number] Confirmation # :[id number]

## 2011-03-24 NOTE — Progress Notes (Signed)
Patient was seen with resident Dr. Nodelman.  I concur with their assessment and treatment plan.

## 2011-03-25 ENCOUNTER — Other Ambulatory Visit (HOSPITAL_BASED_OUTPATIENT_CLINIC_OR_DEPARTMENT_OTHER): Payer: Self-pay | Admitting: Podiatrist

## 2011-03-25 ENCOUNTER — Ambulatory Visit (HOSPITAL_BASED_OUTPATIENT_CLINIC_OR_DEPARTMENT_OTHER): Payer: Medicaid Other | Admitting: Podiatrist

## 2011-03-25 DIAGNOSIS — G9050 Complex regional pain syndrome I, unspecified: Secondary | ICD-10-CM

## 2011-03-25 NOTE — Progress Notes (Signed)
40 year old female following up Reflex sympathetic dystrophy right foot. She has been receiving twice weekly nerve blocks for her RSD. She states the these blocks are very effective in relieving her pain. However, she did miss her last appointment, so states she is having considerably more pain today as the blocks only last for about 2-3 days. Pain currently is 7/10. She states she did fall last Tuesday and has even increased pain to the left foot as a result. We did obtain x-rays at last visit, which are to be reviewed today.   ROS: Right foot pain per above    Patient denies any changes to PMH, Meds, Allergies    Physical exam:  Gen: NAD, A&Ox3  Right foot: Pedal pulses palpable 2/4. Foot is warm to cool at toes. There is erythema to the dorsal aspect of her forefoot and her skin is hypersensitive to touch. There is slight musculature atrophy at the level of the ankle joint compared to the left. She has pain out of proportion on palpation of her forefoot.    X-Rays: No fractures or dislocations noted.    ASSESSMENT: Reflex Sympathetic Dystrophy Right foot    PLAN:  1. After confirming patient's name and date of birth 2 injections were performed under aseptic technique on the right lower extremity:      - Common peroneal block 4.5 cc 0.5% Marcaine + 0.5 cc Dexamethasone phosphate 4 mg/mL     - Tibial nerve block: 4.5 cc 0.5% Marcaine + 0.5 cc Dexamethasone phosphate 4 mg/mL  Patient tolerated procedure well with no complications. She will return on Tuesday for follow up.

## 2011-03-29 NOTE — Progress Notes (Signed)
Patient was seen with resident Dr. Ben-Ad.  I concur with their assessment and treatment plan.

## 2011-03-30 ENCOUNTER — Ambulatory Visit (HOSPITAL_BASED_OUTPATIENT_CLINIC_OR_DEPARTMENT_OTHER): Payer: Medicaid Other | Admitting: Podiatrist

## 2011-03-30 DIAGNOSIS — G9050 Complex regional pain syndrome I, unspecified: Secondary | ICD-10-CM

## 2011-03-30 DIAGNOSIS — M79673 Pain in unspecified foot: Secondary | ICD-10-CM

## 2011-03-30 MED ORDER — TAPENTADOL HCL ER 150 MG PO TB12
1.00 | ORAL_TABLET | Freq: Two times a day (BID) | ORAL | Status: AC
Start: 2011-03-30 — End: 2011-04-29

## 2011-03-30 NOTE — Progress Notes (Signed)
This office note has been dictated. Account number [id number]

## 2011-04-01 ENCOUNTER — Ambulatory Visit (HOSPITAL_BASED_OUTPATIENT_CLINIC_OR_DEPARTMENT_OTHER): Payer: Medicaid Other | Admitting: Podiatrist

## 2011-04-01 DIAGNOSIS — G9050 Complex regional pain syndrome I, unspecified: Secondary | ICD-10-CM

## 2011-04-01 DIAGNOSIS — M79673 Pain in unspecified foot: Secondary | ICD-10-CM

## 2011-04-01 DIAGNOSIS — S9030XA Contusion of unspecified foot, initial encounter: Secondary | ICD-10-CM

## 2011-04-04 NOTE — Progress Notes (Signed)
40 year old female following up Reflex sympathetic dystrophy right foot. She has been receiving twice weekly nerve blocks for her RSD. She states the these blocks are very effective in relieving her pain.  Pain currently is 5/10.   ROS: Right foot pain per above   Patient denies any changes to PMH, Meds, Allergies   Physical exam:   Gen: NAD, A&Ox3   Right foot: Pedal pulses palpable 2/4. Foot is warm to cool at toes. There is erythema to the dorsal aspect of her forefoot and her skin is hypersensitive to touch. There is slight musculature atrophy at the level of the ankle joint compared to the left. She has pain out of proportion on palpation of her forefoot.   ASSESSMENT: Reflex Sympathetic Dystrophy Right foot   PLAN:   1. After confirming patient's name and date of birth 2 injections were performed under aseptic technique on the right lower extremity:   - Common peroneal block 4.5 cc 0.5% Marcaine + 0.5 cc Dexamethasone phosphate 4 mg/mL   - Tibial nerve block: 4.5 cc 0.5% Marcaine + 0.5 cc Dexamethasone phosphate 4 mg/mL   Patient tolerated procedure well with no complications. She will return on Tuesday for follow up.

## 2011-04-06 ENCOUNTER — Ambulatory Visit (HOSPITAL_BASED_OUTPATIENT_CLINIC_OR_DEPARTMENT_OTHER): Payer: Medicaid Other | Admitting: Podiatrist

## 2011-04-06 NOTE — Progress Notes (Signed)
Date of Service: 03/30/2011    SUBJECTIVE:  The patient presents to the office today for followup for reflex sympathetic dystrophy of her right foot and leg.  She continues to show improvement following injections.  After injection, she has relief for approximately 2-3 days.  Today we have administered a posterior tibial nerve block and a common peroneal block, each with 5 mL of 0.5% plain bupivacaine and 0.5 mL of dexamethasone, 4 mg per mL.  She tolerated the injections well without any complications noted.  We also gave her a new prescription for a new pain medication today in order to see if we can get some additional extended relief and try to decrease the need for continued nerve blocks.    PAST MEDICAL HISTORY:  Reviewed and is unchanged since her last visit.  She denies fever, chills, nausea, vomiting, night sweats, and shortness of breath.  She has no obvious breaks in the skin and no complications since her last visit.      PLAN:  She was given the Nucynta 150 mg b.i.d. and we will see how she responds to this.  Follow up with her as indicated.    ___________________________  Reviewed and Electronically Signed By: Naoma Diener DPM  Sig Date: 04/06/2011  Sig Time: 23:24:22  Dictated By: Naoma Diener DPM  Dict Date: 03/30/2011 Dict Time: 05 29 PM    Dictation Date and Time:03/30/2011 17:29:17  Transcription Date and Time:03/30/2011 21:21:24  eScription Dictation id: [id number] Confirmation # :[id number]

## 2011-04-07 ENCOUNTER — Ambulatory Visit (HOSPITAL_BASED_OUTPATIENT_CLINIC_OR_DEPARTMENT_OTHER): Payer: Medicaid Other | Admitting: Podiatrist

## 2011-04-07 DIAGNOSIS — G9050 Complex regional pain syndrome I, unspecified: Secondary | ICD-10-CM

## 2011-04-07 MED ORDER — TRAMADOL HCL 50 MG PO TABS
50.0000 mg | ORAL_TABLET | Freq: Four times a day (QID) | ORAL | Status: DC | PRN
Start: 2011-04-07 — End: 2011-09-27

## 2011-04-07 MED ORDER — ZOLPIDEM TARTRATE ER 12.5 MG PO TBCR
12.5000 mg | EXTENDED_RELEASE_TABLET | Freq: Every evening | ORAL | Status: DC | PRN
Start: 2011-04-07 — End: 2011-06-23

## 2011-04-07 NOTE — Progress Notes (Signed)
Pt seen with [facility].  I concur with their assessment, and confirm that this treatment plan is correct.
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
## 2011-04-09 ENCOUNTER — Ambulatory Visit (HOSPITAL_BASED_OUTPATIENT_CLINIC_OR_DEPARTMENT_OTHER): Payer: Medicaid Other | Admitting: Otolaryngology

## 2011-04-14 ENCOUNTER — Ambulatory Visit (HOSPITAL_BASED_OUTPATIENT_CLINIC_OR_DEPARTMENT_OTHER): Payer: Medicaid Other | Admitting: Podiatrist

## 2011-04-14 DIAGNOSIS — G9050 Complex regional pain syndrome I, unspecified: Secondary | ICD-10-CM

## 2011-04-18 NOTE — Progress Notes (Signed)
S:   40 y/o female pt returns to clinic for follow up of Reflex Sympathetic Dystrophy of right foot. Pt states that a heavy shelf fell on her right foot in July 2011 while she was working, and she started to have sharp/numbness pain and temperature changes in her right foot. Pt states that she has been getting physical therapies, but it did not help her much. Pt has been getting  blocks to the nerves of right leg and foot twice per week for last 6 months. Pt states that her pain gets better for few days after the steroid injections, but the pain comes back when the medications wears off. Pt states that she has been ambulating with walking CAM boot on right foot.   O:   -Gen: NAD, AOx3, pleasant and talkative   -Vascular: DP/PT are palpable, 2/4, bil. CFT < 2 sec, 1-5, bil. Slight erythema at the dorsal aspect of the forefoot of right foot. No edema, bil. No varicose vein, bil.   -Derm: Skin of right foot is warmer than left foot. Nails are WNL, 1-5, bil. No open lesions, bil. No interdigital macerations, bil.   -Neuro: Proprioception is intact to hallux, bil. Decreased sensation of right foot.   -MSK: Pain on palpation at the dorsum of forefoot and sub metatarsal heads of right foot. Decreased muscles strength, 4/5, for inversion, eversion, plantarflexion, and dorsiflexion of right foot.   A/P: 40 y/o female pt with Reflex Sympathetic Dystrophy of right foot   -Pt's feet were examined and treated   -common peroneal block: 4.5 cc 0.5% Marcaine + 0.5 cc Dexamethasone phosphate 4 mg/mL   -Tibial nerve block: 4.5 cc 0.5% Marcaine + 0.5 cc Dexamethasone phosphate 4 mg/mL   -Pt returns to clinic for follow up 1 week

## 2011-04-20 ENCOUNTER — Ambulatory Visit (HOSPITAL_BASED_OUTPATIENT_CLINIC_OR_DEPARTMENT_OTHER): Payer: Medicaid Other | Admitting: Podiatrist

## 2011-04-21 ENCOUNTER — Ambulatory Visit (HOSPITAL_BASED_OUTPATIENT_CLINIC_OR_DEPARTMENT_OTHER): Payer: Medicaid Other | Admitting: Podiatrist

## 2011-04-21 DIAGNOSIS — G9050 Complex regional pain syndrome I, unspecified: Secondary | ICD-10-CM

## 2011-04-21 MED ORDER — TAPENTADOL HCL ER 150 MG PO TB12
1.0000 | ORAL_TABLET | Freq: Three times a day (TID) | ORAL | Status: DC
Start: 2011-04-21 — End: 2011-05-04

## 2011-04-21 MED ORDER — AZITHROMYCIN 250 MG PO TABS
250.00 mg | ORAL_TABLET | Freq: Every day | ORAL | Status: AC
Start: 2011-04-21 — End: 2011-04-26

## 2011-04-21 MED ORDER — DIAZEPAM 5 MG PO TABS
5.0000 mg | ORAL_TABLET | Freq: Two times a day (BID) | ORAL | Status: DC | PRN
Start: 2011-04-21 — End: 2011-07-29

## 2011-04-23 NOTE — Progress Notes (Signed)
This office note has been dictated. Account number [id number]

## 2011-04-25 ENCOUNTER — Encounter (HOSPITAL_BASED_OUTPATIENT_CLINIC_OR_DEPARTMENT_OTHER): Payer: Self-pay | Admitting: Internal Medicine

## 2011-04-25 ENCOUNTER — Other Ambulatory Visit (HOSPITAL_BASED_OUTPATIENT_CLINIC_OR_DEPARTMENT_OTHER): Payer: Self-pay | Admitting: Podiatrist

## 2011-04-26 ENCOUNTER — Ambulatory Visit (HOSPITAL_BASED_OUTPATIENT_CLINIC_OR_DEPARTMENT_OTHER): Payer: Medicaid Other | Admitting: Internal Medicine

## 2011-04-26 ENCOUNTER — Encounter (HOSPITAL_BASED_OUTPATIENT_CLINIC_OR_DEPARTMENT_OTHER): Payer: Self-pay | Admitting: Internal Medicine

## 2011-04-26 VITALS — BP 116/70 | HR 99 | Temp 96.8°F | Resp 12 | Wt 155.0 lb

## 2011-04-26 DIAGNOSIS — G4700 Insomnia, unspecified: Secondary | ICD-10-CM

## 2011-04-26 DIAGNOSIS — G9050 Complex regional pain syndrome I, unspecified: Secondary | ICD-10-CM

## 2011-04-26 DIAGNOSIS — F419 Anxiety disorder, unspecified: Secondary | ICD-10-CM

## 2011-04-26 DIAGNOSIS — J45909 Unspecified asthma, uncomplicated: Secondary | ICD-10-CM
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
## 2011-04-27 ENCOUNTER — Ambulatory Visit (HOSPITAL_BASED_OUTPATIENT_CLINIC_OR_DEPARTMENT_OTHER): Payer: Medicaid Other | Admitting: Podiatrist

## 2011-04-28 ENCOUNTER — Telehealth (HOSPITAL_BASED_OUTPATIENT_CLINIC_OR_DEPARTMENT_OTHER): Payer: Self-pay | Admitting: Internal Medicine

## 2011-04-28 NOTE — Telephone Encounter (Signed)
Call to patient to inform of psych referral/reschedule with Darlys Gales. Mailbox full so I couldn't leave a message. Called second number listed in chart.   Discussed plan with patient - referral to psychiatry and reschedule with Darlys Gales. Will see for recommendations re insomnia and anxiety.   Patient agrees with plan.

## 2011-04-30 NOTE — Progress Notes (Signed)
Date of Service: 04/21/2011    SUBJECTIVE:  The patient presents to the office today for followup for reflex sympathetic dystrophy.  She continues to have this pain and coolness to her foot.    PLAN:  Today, we performed a common peroneal nerve block and a posterior tibial nerve block consisting of 5 mL of local anesthetic with bupivacaine plain and 0.5 mL of _____ plain.  She tolerated the injection well without any complications noted.  She indicates that she does feel that she is getting better, although very slowly.  We will follow up with her as indicated.    ___________________________  Reviewed and Electronically Signed By: Naoma Diener DPM  Sig Date: 04/30/2011  Sig Time: 10:31:58  Dictated By: Naoma Diener DPM  Dict Date: 04/23/2011 Dict Time: 09 48 PM    Dictation Date and Time:04/23/2011 21:48:47  Transcription Date and Time:04/24/2011 10:21:42  eScription Dictation id: [id number] Confirmation # :[id number]

## 2011-05-03 NOTE — Telephone Encounter (Signed)
Mailbox full

## 2011-05-04 ENCOUNTER — Ambulatory Visit (HOSPITAL_BASED_OUTPATIENT_CLINIC_OR_DEPARTMENT_OTHER): Payer: No Typology Code available for payment source | Admitting: Podiatrist

## 2011-05-04 VITALS — BP 144/111 | HR 93

## 2011-05-04 DIAGNOSIS — M79673 Pain in unspecified foot: Secondary | ICD-10-CM

## 2011-05-04 DIAGNOSIS — G90529 Complex regional pain syndrome I of unspecified lower limb: Secondary | ICD-10-CM

## 2011-05-04 MED ORDER — TAPENTADOL HCL ER 150 MG PO TB12
1.0000 | ORAL_TABLET | Freq: Three times a day (TID) | ORAL | Status: DC
Start: 2011-05-04 — End: 2011-05-27

## 2011-05-04 NOTE — Telephone Encounter (Addendum)
Placed call to second number [phone] left message on cell. Pt is at another appt i will try again later.

## 2011-05-04 NOTE — Telephone Encounter (Addendum)
Pt's appt is on 06/15/11 with Darlys Gales @ [facility]. Left message on pt's answering machine regarding specialty appt.date. Appt info mailed to pt.

## 2011-05-06 ENCOUNTER — Ambulatory Visit (HOSPITAL_BASED_OUTPATIENT_CLINIC_OR_DEPARTMENT_OTHER): Payer: No Typology Code available for payment source | Admitting: Podiatrist

## 2011-05-06 DIAGNOSIS — G9050 Complex regional pain syndrome I, unspecified: Secondary | ICD-10-CM

## 2011-05-06 NOTE — Progress Notes (Signed)
40 year old female presents for follow up for RSD, right foot. Patient has been receiving twice weekly posterior tibial and common peroneal nerve blocks. She states these greatly improve her pain. She denies any pain currently. She continues to take the Nucynta.   ROS: Upper respiratory symptoms resolved since last week.     PMH, Meds, Allergies reviewed. Patient denies any change since last visit.     PE:  - Gen: A&Ox3, NAD  - RLE: No significant tenderness with palpation. There is some mild hyperpigmentation at the common peroneal nerve injection site.    A/P: Reflex Sympathetic Dystrophy, right foot  - 2 injections consisting of 4.75cc of 0.5% Marcaine pl and 0.25cc of Dexamethasone 4mg  into each site (posterior tibial and common peroneal nerves). Patient tolerated procedure well with no complications. She will return 5 days for subsequent injections.

## 2011-05-06 NOTE — Progress Notes (Deleted)
40 year old female presents for follow up for RSD. Patient receives twice weekly PT and common peroneal nerve blocks. She states

## 2011-05-07 NOTE — Progress Notes (Signed)
Patient was seen with resident Dr. Ben-Ad.  I concur with their assessment and treatment plan.

## 2011-05-09 NOTE — Progress Notes (Signed)
This office note has been dictated. Account number [id number]

## 2011-05-11 ENCOUNTER — Ambulatory Visit (HOSPITAL_BASED_OUTPATIENT_CLINIC_OR_DEPARTMENT_OTHER): Payer: No Typology Code available for payment source | Admitting: Podiatrist

## 2011-05-11 NOTE — Progress Notes (Signed)
Date of Service: 05/04/2011    SUBJECTIVE:  The patient presents to the office today for followup for reflex sympathetic dystrophy in her right foot.      PLAN:  Today, I have performed blocks of her posterior tibial and common peroneal nerves.  She tolerated the injections well without any complications noted.  Injections contained 5 mL of plain bupivacaine and 0.25 mL of dexamethasone 4 mg/mL in each location.  In addition, I have referred her to psychiatry for counseling with regard to her chronic pain syndrome.  We will follow up with her later this week for additional nerve block as needed.    ___________________________  Reviewed and Electronically Signed By: Naoma Diener DPM  Sig Date: 05/11/2011  Sig Time: 15:05:33  Dictated By: Naoma Diener DPM  Dict Date: 05/04/2011 Dict Time: 01 17 PM    Dictation Date and Time:05/04/2011 13:17:13  Transcription Date and Time:05/04/2011 16:07:31  eScription Dictation id: [id number] Confirmation # :[id number]

## 2011-05-13 ENCOUNTER — Ambulatory Visit (HOSPITAL_BASED_OUTPATIENT_CLINIC_OR_DEPARTMENT_OTHER): Payer: No Typology Code available for payment source | Admitting: Podiatrist

## 2011-05-13 ENCOUNTER — Ambulatory Visit (HOSPITAL_BASED_OUTPATIENT_CLINIC_OR_DEPARTMENT_OTHER): Payer: PRIVATE HEALTH INSURANCE | Admitting: Podiatrist

## 2011-05-13 DIAGNOSIS — G9050 Complex regional pain syndrome I, unspecified: Secondary | ICD-10-CM

## 2011-05-14 NOTE — Progress Notes (Signed)
This office note has been dictated. Account number [id number]

## 2011-05-18 ENCOUNTER — Ambulatory Visit (HOSPITAL_BASED_OUTPATIENT_CLINIC_OR_DEPARTMENT_OTHER): Payer: PRIVATE HEALTH INSURANCE | Admitting: Podiatrist

## 2011-05-20 ENCOUNTER — Ambulatory Visit (HOSPITAL_BASED_OUTPATIENT_CLINIC_OR_DEPARTMENT_OTHER): Payer: PRIVATE HEALTH INSURANCE | Admitting: Podiatrist

## 2011-05-20 DIAGNOSIS — M79673 Pain in unspecified foot: Secondary | ICD-10-CM

## 2011-05-20 DIAGNOSIS — G9050 Complex regional pain syndrome I, unspecified: Secondary | ICD-10-CM

## 2011-05-21 ENCOUNTER — Other Ambulatory Visit (HOSPITAL_BASED_OUTPATIENT_CLINIC_OR_DEPARTMENT_OTHER): Payer: Self-pay | Admitting: Podiatrist

## 2011-05-21 NOTE — Progress Notes (Signed)
40 year old female presents for follow up for RSD, right foot. Patient has been receiving twice weekly posterior tibial and common peroneal nerve blocks. She states these greatly improve her pain. She denies any pain currently. She continues to take the Nucynta.   ROS: Upper respiratory symptoms resolved since last week.     PMH, Meds, Allergies reviewed. Patient denies any change since last visit.     PE:  - Gen: A&Ox3, NAD  - RLE: No significant tenderness with palpation. There is some mild hyperpigmentation at the common peroneal nerve injection site.    A/P: Reflex Sympathetic Dystrophy, right foot  - 2 injections consisting of 4.75cc of 0.5% Marcaine pl and 0.25cc of Dexamethasone 4mg into each site (posterior tibial and common peroneal nerves). Patient tolerated procedure well with no complications. She will return 5 days for subsequent injections.

## 2011-05-25 NOTE — Progress Notes (Signed)
Date of Service: 05/13/2011    SUBJECTIVE:  The patient presents to the office today for followup for reflex sympathetic dystrophy.  She notes that lately she has noted some improvement in symptoms.  She still has discomfort, and I have recommended that she continue with injections; however, she does note that her pain has diminished over the last week.  Both common peroneal block and a _____ nerve block were administered to her today, each consisting of 5 mL of bupivacaine plain with 0.25 mL of dexamethasone 4 mg per mL.  She tolerated the injection well without any complications noted.    Plan is for her to follow up with [place] on a p.r.n. basis next week.    ___________________________  Reviewed and Electronically Signed By: Naoma Diener DPM  Sig Date: 05/25/2011  Sig Time: 12:42:48  Dictated By: Naoma Diener DPM  Dict Date: 05/14/2011 Dict Time: 01 28 PM    Dictation Date and Time:05/14/2011 13:28:48  Transcription Date and Time:05/14/2011 16:49:37  eScription Dictation id: [id number] Confirmation # :[id number]

## 2011-05-27 ENCOUNTER — Ambulatory Visit (HOSPITAL_BASED_OUTPATIENT_CLINIC_OR_DEPARTMENT_OTHER): Payer: PRIVATE HEALTH INSURANCE | Admitting: Podiatrist

## 2011-05-27 DIAGNOSIS — G9050 Complex regional pain syndrome I, unspecified: Secondary | ICD-10-CM

## 2011-05-27 MED ORDER — TAPENTADOL HCL ER 150 MG PO TB12
1.0000 | ORAL_TABLET | Freq: Three times a day (TID) | ORAL | Status: DC
Start: 2011-05-27 — End: 2011-06-29

## 2011-05-29 NOTE — Progress Notes (Signed)
40 year old female presents for follow up for RSD, right foot. Patient has been receiving twice weekly posterior tibial and common peroneal nerve blocks. She states these greatly improve her pain. She denies any pain currently. She continues to take the Nucynta.   ROS: Upper respiratory symptoms resolved since last week.     PMH, Meds, Allergies reviewed. Patient denies any change since last visit.     PE:  - Gen: A&Ox3, NAD  - RLE: No significant tenderness with palpation. There is some mild hyperpigmentation at the common peroneal nerve injection site.    A/P: Reflex Sympathetic Dystrophy, right foot  - 2 injections consisting of 4.75cc of 0.5% Marcaine pl and 0.25cc of Dexamethasone 4mg into each site (posterior tibial and common peroneal nerves). Patient tolerated procedure well with no complications. She will return 5 days for subsequent injections.

## 2011-05-31 ENCOUNTER — Other Ambulatory Visit (HOSPITAL_BASED_OUTPATIENT_CLINIC_OR_DEPARTMENT_OTHER): Payer: Self-pay | Admitting: Podiatrist

## 2011-06-09 ENCOUNTER — Ambulatory Visit (HOSPITAL_BASED_OUTPATIENT_CLINIC_OR_DEPARTMENT_OTHER): Payer: PRIVATE HEALTH INSURANCE | Admitting: Podiatrist

## 2011-06-10 ENCOUNTER — Ambulatory Visit (HOSPITAL_BASED_OUTPATIENT_CLINIC_OR_DEPARTMENT_OTHER): Payer: PRIVATE HEALTH INSURANCE | Admitting: Podiatrist

## 2011-06-10 DIAGNOSIS — M79673 Pain in unspecified foot: Secondary | ICD-10-CM

## 2011-06-10 DIAGNOSIS — S9030XA Contusion of unspecified foot, initial encounter: Secondary | ICD-10-CM

## 2011-06-10 DIAGNOSIS — G9050 Complex regional pain syndrome I, unspecified: Secondary | ICD-10-CM

## 2011-06-10 MED ORDER — LIDOCAINE 5 % EX PTCH
1.0000 | MEDICATED_PATCH | CUTANEOUS | Status: DC
Start: 2011-06-10 — End: 2014-10-23

## 2011-06-12 NOTE — Progress Notes (Signed)
40 year old female presents for follow up for RSD, right foot. Patient has been receiving twice weekly posterior tibial and common peroneal nerve blocks. She states these greatly improve her pain. She denies any pain currently. She continues to take the Nucynta.   ROS: Upper respiratory symptoms resolved since last week.     PMH, Meds, Allergies reviewed. Patient denies any change since last visit.     PE:  - Gen: A&Ox3, NAD  - RLE: No significant tenderness with palpation. There is some mild hyperpigmentation at the common peroneal nerve injection site.    A/P: Reflex Sympathetic Dystrophy, right foot  - 2 injections consisting of 4.75cc of 0.5% Marcaine pl and 0.25cc of Dexamethasone 4mg into each site (posterior tibial and common peroneal nerves). Patient tolerated procedure well with no complications. She will return 5 days for subsequent injections.

## 2011-06-15 ENCOUNTER — Ambulatory Visit (HOSPITAL_BASED_OUTPATIENT_CLINIC_OR_DEPARTMENT_OTHER): Payer: Medicaid Other

## 2011-06-16 ENCOUNTER — Ambulatory Visit (HOSPITAL_BASED_OUTPATIENT_CLINIC_OR_DEPARTMENT_OTHER): Payer: PRIVATE HEALTH INSURANCE | Admitting: Podiatrist

## 2011-06-17 ENCOUNTER — Telehealth (HOSPITAL_BASED_OUTPATIENT_CLINIC_OR_DEPARTMENT_OTHER): Payer: Self-pay

## 2011-06-17 NOTE — Telephone Encounter (Signed)
SW called pt because she dnka with this SW on 06/15/11.  SW left vm asking pt to call back and r/s.

## 2011-06-18 ENCOUNTER — Ambulatory Visit (HOSPITAL_BASED_OUTPATIENT_CLINIC_OR_DEPARTMENT_OTHER): Payer: Medicaid Other | Admitting: Otolaryngology

## 2011-06-21 ENCOUNTER — Ambulatory Visit (HOSPITAL_BASED_OUTPATIENT_CLINIC_OR_DEPARTMENT_OTHER): Payer: PRIVATE HEALTH INSURANCE | Admitting: Podiatrist

## 2011-06-21 DIAGNOSIS — M79673 Pain in unspecified foot: Secondary | ICD-10-CM

## 2011-06-21 DIAGNOSIS — G9050 Complex regional pain syndrome I, unspecified: Secondary | ICD-10-CM

## 2011-06-21 NOTE — Progress Notes (Signed)
40 year old female presents for follow up for RSD, right foot. Patient has been receiving twice weekly posterior tibial and common peroneal nerve blocks, however, she states that she is now able to go one week in between injections now. She states these greatly improve her pain. She has discontinued use of the CAM boot with no significant difficulty. She denies any pain currently. She continues to take the Nucynta.   ROS: Unremarkable  PMH, Meds, Allergies reviewed. Patient denies any change since last visit.   PE:   - Gen: A&Ox3, NAD   - RLE: No significant tenderness with palpation. There is some mild hyperpigmentation at the common peroneal nerve injection site.   A/P: Reflex Sympathetic Dystrophy, right foot   - 2 injections consisting of 4.75cc of 0.5% Marcaine pl and 0.25cc of Dexamethasone 4mg into each site (posterior tibial and common peroneal nerves). Patient tolerated procedure well with no complications. She will return one week for subsequent injections

## 2011-06-22 ENCOUNTER — Encounter (HOSPITAL_BASED_OUTPATIENT_CLINIC_OR_DEPARTMENT_OTHER): Payer: Self-pay | Admitting: Podiatrist

## 2011-06-22 ENCOUNTER — Other Ambulatory Visit (HOSPITAL_BASED_OUTPATIENT_CLINIC_OR_DEPARTMENT_OTHER): Payer: Self-pay | Admitting: Podiatrist

## 2011-06-23 MED ORDER — ZOLPIDEM TARTRATE ER 12.5 MG PO TBCR
12.5000 mg | EXTENDED_RELEASE_TABLET | Freq: Every evening | ORAL | Status: DC | PRN
Start: 2011-06-23 — End: 2011-07-21

## 2011-06-27 NOTE — Progress Notes (Signed)
Patient was seen with resident Dr. Ben-Ad.  I concur with their assessment and treatment plan.

## 2011-06-29 ENCOUNTER — Ambulatory Visit (HOSPITAL_BASED_OUTPATIENT_CLINIC_OR_DEPARTMENT_OTHER): Payer: PRIVATE HEALTH INSURANCE | Admitting: Podiatrist

## 2011-06-29 DIAGNOSIS — G9050 Complex regional pain syndrome I, unspecified: Secondary | ICD-10-CM

## 2011-06-29 MED ORDER — TAPENTADOL HCL ER 150 MG PO TB12
1.0000 | ORAL_TABLET | Freq: Four times a day (QID) | ORAL | Status: AC | PRN
Start: 2011-06-29 — End: 2011-07-29

## 2011-07-01 ENCOUNTER — Ambulatory Visit (HOSPITAL_BASED_OUTPATIENT_CLINIC_OR_DEPARTMENT_OTHER): Payer: PRIVATE HEALTH INSURANCE | Admitting: Podiatrist

## 2011-07-01 ENCOUNTER — Ambulatory Visit (HOSPITAL_BASED_OUTPATIENT_CLINIC_OR_DEPARTMENT_OTHER): Payer: Self-pay | Admitting: Pulmonary

## 2011-07-01 ENCOUNTER — Encounter (HOSPITAL_BASED_OUTPATIENT_CLINIC_OR_DEPARTMENT_OTHER): Payer: Self-pay

## 2011-07-01 DIAGNOSIS — B351 Tinea unguium: Secondary | ICD-10-CM

## 2011-07-01 DIAGNOSIS — L853 Xerosis cutis: Secondary | ICD-10-CM

## 2011-07-01 DIAGNOSIS — G9050 Complex regional pain syndrome I, unspecified: Secondary | ICD-10-CM

## 2011-07-01 MED ORDER — UREA 40 % EX OINT
TOPICAL_OINTMENT | Freq: Every evening | CUTANEOUS | Status: AC
Start: 2011-07-01 — End: 2011-07-31

## 2011-07-01 NOTE — Progress Notes (Signed)
41 year old female presents for follow up for RSD, right foot. Patient has been receiving twice weekly posterior tibial and common peroneal nerve blocks, however, she states that she is now able to go one week in between injections now. She states these greatly improve her pain. She has discontinued use of the CAM boot with no significant difficulty. She denies any pain currently. She continues to take the Nucynta. She is also concerned about fissuring on her right heel, and across the ball of her foot.  She also has yellowish discoloration along the medial margin of the hallux, with a mild onychocryptosis.  ROS: Unremarkable  PMH, Meds, Allergies reviewed. Patient denies any change since last visit.   PE:   - Gen: A&Ox3, NAD   - RLE: No significant tenderness with palpation. There is some mild hyperpigmentation at the common peroneal nerve injection site.   A/P: Reflex Sympathetic Dystrophy, right foot   - 2 injections consisting of 4.75cc of 0.5% Marcaine pl and 0.25cc of Dexamethasone 4mg  into each site (posterior tibial and common peroneal nerves). Patient tolerated procedure well with no complications. She will return one week for subsequent injections.  I have also given her an Rx for Urea 40% ointment, and have taken cultures of the discolored right hallux nail.

## 2011-07-01 NOTE — Progress Notes (Signed)
41 year old female presents for follow up for RSD, right foot. Patient has been receiving twice weekly posterior tibial and common peroneal nerve blocks, however, she states that she is now able to go one week in between injections now. She states these greatly improve her pain. She has discontinued use of the CAM boot with no significant difficulty. She denies any pain currently. She continues to take the Nucynta.   ROS: Unremarkable  PMH, Meds, Allergies reviewed. Patient denies any change since last visit.   PE:   - Gen: A&Ox3, NAD   - RLE: No significant tenderness with palpation. There is some mild hyperpigmentation at the common peroneal nerve injection site.   A/P: Reflex Sympathetic Dystrophy, right foot   - 2 injections consisting of 4.75cc of 0.5% Marcaine pl and 0.25cc of Dexamethasone 4mg  into each site (posterior tibial and common peroneal nerves). Patient tolerated procedure well with no complications. She will return one week for subsequent injections

## 2011-07-06 ENCOUNTER — Ambulatory Visit (HOSPITAL_BASED_OUTPATIENT_CLINIC_OR_DEPARTMENT_OTHER): Payer: PRIVATE HEALTH INSURANCE | Admitting: Podiatrist

## 2011-07-06 DIAGNOSIS — S9030XA Contusion of unspecified foot, initial encounter: Secondary | ICD-10-CM

## 2011-07-06 DIAGNOSIS — M792 Neuralgia and neuritis, unspecified: Secondary | ICD-10-CM

## 2011-07-06 DIAGNOSIS — G9050 Complex regional pain syndrome I, unspecified: Secondary | ICD-10-CM

## 2011-07-06 NOTE — Progress Notes (Signed)
Podiatry Clinic Follow Up Note  S: 41 year old female with RSD of right foot returns to clinic for therapeutic nerve blocks.  Patient was progressing well and down to just once weekly injections until roughly 2 months prior.  Patient is now back to twice weekly injections.  Patient has no other pedal complaints at this time, no constitutional symptoms.  Currently sitting in the chair, her pain is 4/10, and not radiating.    O:  General: Patient is alert/oriented x3, in no acute distress  Vascular: Pedal pulses palpable within normal limits, capillary refill is <3s bilaterally  Dermatologic: skin is warm to the touch, no dusky discolorations or mottled appearance of the right foot.  No open lesions  Neurologic: Gross epicritic sensation is intact bilaterally, allodynia/hyperthesias along right medial foot    A/P: 41 year old female with RSD presents for continued therapy  -Patient evaluated and treated  -4.75 cc of 0.5% marcaine plain and .25cc of dexamethasone 4mg  injected into posterior tibial nerve and into common peroneal nerve each.  -Patient will return to clinic on Thursday for further treatment  -Patient informed Dr. Rosilyn Mings will be out of town next week and she agreed to have Dr. Marcha Dutton provide the injections.

## 2011-07-07 NOTE — Progress Notes (Signed)
Pt seen with PMS[facility] Karwal.  I concur with their assessment, and confirm that this treatment plan is correct.

## 2011-07-08 ENCOUNTER — Ambulatory Visit (HOSPITAL_BASED_OUTPATIENT_CLINIC_OR_DEPARTMENT_OTHER): Payer: PRIVATE HEALTH INSURANCE | Admitting: Podiatrist

## 2011-07-08 ENCOUNTER — Encounter (HOSPITAL_BASED_OUTPATIENT_CLINIC_OR_DEPARTMENT_OTHER): Payer: Self-pay | Admitting: Podiatrist

## 2011-07-08 ENCOUNTER — Ambulatory Visit (HOSPITAL_BASED_OUTPATIENT_CLINIC_OR_DEPARTMENT_OTHER): Payer: Medicaid Other

## 2011-07-08 DIAGNOSIS — S9030XA Contusion of unspecified foot, initial encounter: Secondary | ICD-10-CM

## 2011-07-08 DIAGNOSIS — M792 Neuralgia and neuritis, unspecified: Secondary | ICD-10-CM

## 2011-07-08 DIAGNOSIS — F32A Depression, unspecified: Secondary | ICD-10-CM

## 2011-07-08 DIAGNOSIS — G9050 Complex regional pain syndrome I, unspecified: Secondary | ICD-10-CM

## 2011-07-08 DIAGNOSIS — F329 Major depressive disorder, single episode, unspecified: Principal | ICD-10-CM

## 2011-07-08 MED ORDER — RAMELTEON 8 MG PO TABS
8.00 mg | ORAL_TABLET | Freq: Every evening | ORAL | Status: AC
Start: 2011-07-08 — End: 2011-10-05

## 2011-07-08 MED ORDER — FLUOXETINE HCL 10 MG PO CAPS
ORAL_CAPSULE | ORAL | Status: DC
Start: 2011-07-08 — End: 2011-07-20

## 2011-07-08 NOTE — Progress Notes (Signed)
S: 41 year old female with RSD of right foot returns to clinic for therapeutic nerve blocks. Patient is doing well today with decreased pain. Patient has no other pedal complaints at this time, no constitutional symptoms. Currently sitting in the chair, her pain is 4/10, and not radiating.   O:   General: Patient is alert/oriented x3, in no acute distress   Vascular: Pedal pulses palpable within normal limits, capillary refill is <3s bilaterally   Dermatologic: skin is warm to the touch, no dusky discolorations or mottled appearance of the right foot. No open lesions   Neurologic: Gross epicritic sensation is intact bilaterally, allodynia/hyperthesias along right medial foot   A/P: 41 year old female with RSD presents for continued therapy   -Patient evaluated and treated   -4.75 cc of 0.5% marcaine plain and .25cc of dexamethasone 4mg  injected into posterior tibial nerve and into common peroneal nerve each.   -Patient will return to clinic on Thursday for further treatment   -Patient informed I will be out of town next week and she agreed to have Dr. Marcha Dutton provide the injections.

## 2011-07-08 NOTE — Progress Notes (Addendum)
Addended by: Tomie Elko on: 07/08/2011      Modules accepted: Orders

## 2011-07-08 NOTE — Progress Notes (Addendum)
Addended byRosilyn Mings, Juvon Teater on: 07/08/2011      Modules accepted: Orders
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
## 2011-07-12 ENCOUNTER — Ambulatory Visit (HOSPITAL_BASED_OUTPATIENT_CLINIC_OR_DEPARTMENT_OTHER): Payer: PRIVATE HEALTH INSURANCE | Admitting: Foot & Ankle Surgery

## 2011-07-12 ENCOUNTER — Encounter (HOSPITAL_BASED_OUTPATIENT_CLINIC_OR_DEPARTMENT_OTHER): Payer: Self-pay | Admitting: Foot & Ankle Surgery

## 2011-07-12 ENCOUNTER — Telehealth (HOSPITAL_BASED_OUTPATIENT_CLINIC_OR_DEPARTMENT_OTHER): Payer: Self-pay | Admitting: Internal Medicine

## 2011-07-12 VITALS — BP 122/86 | HR 85 | Temp 97.5°F

## 2011-07-12 DIAGNOSIS — G9050 Complex regional pain syndrome I, unspecified: Secondary | ICD-10-CM

## 2011-07-12 NOTE — Telephone Encounter (Signed)
Prior authorization required for medication: ROZEREM 8MG  TABLET  Membership ID (insurance): [id number]  if Medicare insurance , Prescription drug plan: N/A

## 2011-07-12 NOTE — Progress Notes (Signed)
Patient seen with resident and personally examined and treated by me.      41 year old female patient of Dr. Christeen Douglas with RSD, right foot. Patient is following up for bi-weekly nerve blocks. She states she is doing well. Pain is well controlled and she is only requiring one Nucynta tablet a week. She continues to ambulate without difficulty in regular shoes. She no longer needs to be immobilized. She describes shooting pain on bottom of foot and up leg anteriorly.    ROS: Unremarkable. Improved pain right leg.    PMH, Meds, Allergies reviewed.     Physical Exam:  - Gen: A&Ox3, NAD  - Right LE: DP/PT pulses palpable. Gross sensation is intact. Skin coloration is wnl. No pain with light touch or ROM. No edema. No mottling of skin. No temperature changes vs. Contralateral limb.    A/P: RSD, right foot  - Patient's name and date of birth confirmed. A posterior tibial and common peroneal nerve block were administered, each block consisting of 4.75% Marcaine with epinephrine and 0.25% Dexamethasone 4mg  into each area. Use of epinephrine discussed with patient prior to administration of block, as she usually receives Marcaine plain. This was done in order to prolong effect of Marcaine. Patient tolerated procedure well with no complications. She will follow up on Thursday for another block.

## 2011-07-12 NOTE — Progress Notes (Signed)
41 year old female patient of Dr. Christeen Douglas with RSD, right foot. Patient is following up for bi-weekly nerve blocks. She states she is doing well. Pain is well controlled and she is only requiring one Nucynta tablet a week. She continues to ambulate without difficulty in regular shoes.   ROS: Unremarkable    PMH, Meds, Allergies reviewed.     Physical Exam:  - Gen: A&Ox3, NAD  - Right LE: DP/PT pulses palpable. Gross sensation is intact. Skin coloration is wnl.     A/P: RSD, right foot  - Patient's name and date of birth confirmed. A posterior tibial and common peroneal nerve block were administered, each block consisting of 4.75% Marcaine with epinephrine and 0.25% Dexamethasone 4mg . Use of epinephrine discussed with patient prior to administration of block, as she usually receives Marcaine plain. This was done in order to prolong effect of Marcaine. Patient tolerated procedure well with no complications. She will follow up on Thursday.

## 2011-07-13 ENCOUNTER — Telehealth (HOSPITAL_BASED_OUTPATIENT_CLINIC_OR_DEPARTMENT_OTHER): Payer: Self-pay

## 2011-07-13 NOTE — Telephone Encounter (Signed)
Forwarding to prescribing physician Dr. Doreatha Martin.

## 2011-07-13 NOTE — Telephone Encounter (Signed)
Prior authorization request for rozerem 8 mg qhs  Clinical Indication: primary insomnia  Previous medications tried: alprazolam, clonazepam, diazepam, hydroxyzine, lorazepam, quetiapine, zolpidem, trazodone  Diagnostic testing information: diagnostic interview confirming diagnosis

## 2011-07-14 ENCOUNTER — Encounter (HOSPITAL_BASED_OUTPATIENT_CLINIC_OR_DEPARTMENT_OTHER): Payer: Self-pay | Admitting: Internal Medicine

## 2011-07-14 ENCOUNTER — Telehealth (HOSPITAL_BASED_OUTPATIENT_CLINIC_OR_DEPARTMENT_OTHER): Payer: Self-pay

## 2011-07-14 NOTE — Telephone Encounter (Signed)
PRIOR AUTHORIZATION FOR Rozerem    Patient has [employer] for prescription coverage.  I.D.#: [id number]    Filled out [employer] Prior Authorization Request form. Scanned form into Epic under Careers information officer (Updated PA for Rozerem).    Please print out form to be reviewed, signed, and faxed to the insurance company.

## 2011-07-14 NOTE — Telephone Encounter (Signed)
SW reviewed Dr. Camillo Flaming note of 07/08/11.  SW called and offered to see pt again for therapy. Left vm asking her to call back to schedule appt.  SW had called pt on 06/17/11 as she cancelled appt with this SW 2 days earlier.

## 2011-07-15 ENCOUNTER — Ambulatory Visit (HOSPITAL_BASED_OUTPATIENT_CLINIC_OR_DEPARTMENT_OTHER): Payer: PRIVATE HEALTH INSURANCE | Admitting: Foot & Ankle Surgery

## 2011-07-15 DIAGNOSIS — IMO0002 Reserved for concepts with insufficient information to code with codable children: Secondary | ICD-10-CM

## 2011-07-15 NOTE — Telephone Encounter (Signed)
PA signed and faxed today

## 2011-07-15 NOTE — Progress Notes (Signed)
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 Pt relates that she received an injection from Dr. Marcha Dutton last week and that it worked better than the shots she had been getting previously. Pt relates that she is starting to feel tingling in her R foot which usually precedes pain for her condition. Pt relates she has been healthy otherwise.     O:   Vitals:  Derm: Skin is warm, dry and supple, bil. No open lesions or HPKs noted, bil. Nails 1-5 are WNL, bil.     Vascular: DP/PT pulses palpable, bil. CFT <3 secs for digits 1-5, bil. Hair present to the level of the digits, bil. No edema, erythema or varicosities noted, bil.     Neuro:Epicritic sensation grossly intact to the L foot. R foot sensation is severely diminished. SWMF 5.07/10g 1/10.     MSK: All plantarflexors/dorsiflexors/inverters/everters 5/5 for L LE. Muscle groups to R LE are 3/5. No deformities noted, bil.  No crepitation or painful ROM noted for L Ankle, STJ, MTJ,   HPI      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     Comment: crush injury right foot         MEDICATIONS:    Current outpatient prescriptions:  fluoxetine (PROZAC) 10 MG capsule Take  by mouth. One daily for 2 weeks, then 2 daily Disp: 60 capsule Rfl: 3   urea (CARMOL) 40 % ointment Apply  topically nightly. Disp: 30 g Rfl: 1   Tapentadol HCl (NUCYNTA) 150 MG TB12 Take 1 tablet by mouth 4 (four) times daily as needed. Disp: 120 tablet Rfl: 0   zolpidem (AMBIEN CR) 12.5 MG CR tablet Take 1 tablet by mouth nightly as needed for Sleep. Disp: 30 tablet Rfl: 0   lidocaine (LIDODERM) 5 % patch Place 1 patch onto  the skin daily. Disp: 30 patch Rfl: 4   fluticasone-salmeterol (ADVAIR DISKUS) 500-50 MCG/DOSE AEPB Aerosol Powder Inhale 1 puff into the lungs every 12 (twelve) hours. Disp: 180 each Rfl: 4   ipratropium-albuterol (DUO-NEB) 0.5-2.5 (3) MG/3ML SOLN Inhalation Solution Take 3 mLs by nebulization 4 (four) times daily as needed. Disp: 1000 mL Rfl: 11   buPROPion (WELLBUTRIN XL) 300 MG 24 hr tablet Take 1 tablet by mouth daily. Disp: 90 tablet Rfl: 4   hydrochlorothiazide (HYDRODIURIL) 25 MG tablet Take 1 tablet by mouth daily. Disp: 90 tablet Rfl: 4   amlodipine (NORVASC) 5 MG tablet Take 1 tablet by mouth daily. Disp: 90 tablet Rfl: 4   Multiple Vitamin (MULTIVITAMIN) tablet Take 1 tablet by mouth daily. Disp: 30 tablet Rfl: 12   albuterol (PROVENTIL) (2.5 MG/3ML) 0.083% nebulizer solution Take 3 mLs by nebulization every 6 (six) hours as needed for Wheezing. Disp: 75 mL Rfl: 4   hydrOXYzine (ATARAX) 25 MG tablet Take 1 tablet by mouth every 8 (eight) hours as needed for Itching and Anxiety. Disp: 30 tablet Rfl: 0   SYNTHROID 125 MCG tablet Take 1 tablet  by mouth every morning. Disp: 90 tablet Rfl: 4   EPINEPHrine (EPIPEN 2-PAK) 0.3 MG/0.3ML Injection Device Inject 0.3 mg into the muscle once as needed. Disp: 1 each Rfl: 1   ramelteon (ROZEREM) 8 MG tablet Take 1 tablet by mouth nightly. Disp: 30 tablet Rfl: 2   traMADol (ULTRAM) 50 MG tablet Take 1 tablet by mouth every 6 (six) hours as needed. Disp: 30 tablet Rfl: 0   gabapentin (NEURONTIN) 300 MG capsule Take 1 capsule by mouth 3 (three) times daily. Disp: 90 capsule Rfl: 2   albuterol (PROAIR HFA) 108 (90 BASE) MCG/ACT inhaler Inhale 2 puffs into the lungs every 6 (six) hours as needed for Wheezing. Disp: 1 Inhaler Rfl: 3   QUEtiapine (SEROQUEL) 25 MG tablet Take  by mouth. Take 2 tablets by mouth qhs. Then take 1 tablet bid prn anxiety. Disp: 90 tablet Rfl: 0   furosemide (LASIX) 20 MG tablet Take 1 tablet by mouth 2 (two) times daily. Disp: 60 tablet Rfl: 1    Spacer/Aero-Holding Chambers (AEROCHAMBER MAX W/MASK MEDIUM) device Use as instructed Disp: 1 Device Rfl: 0   Nebulizer MISC 1 kit by Does not apply route as needed. Disp: 1 each Rfl: 0   clindamycin-benzoyl peroxide (BENZACLIN) gel Apply  topically 2 (two) times daily. Disp: 50 g Rfl: 4         ALLERGIES:  Review of Patient's Allergies indicates:   Penicillins             Hives   Oxycodone                   Comment:Hives, other narcotics OK   Erythromycin            Nausea Only    Comment:Stomach pain severe no nausea, can take             azithromycin      AP:  1) Reflex sympathetic dystrophy, R Lower extremity    Performed focused podiatric history and physical exam. Injection of 4.75cc 0.5% marcaine with Epi (1:200,000) and .5cc decadron injected into tibial nerve at level of medial malleolus, R. Injection of 4.75cc 0.5% marcaine with Epi(1:200,000) and .5cc decadron injected into region of common peroneal nerve at level of the fibular neck, R. Pt to RTC in 1 week for additional injections or PRN.

## 2011-07-16 ENCOUNTER — Encounter (HOSPITAL_BASED_OUTPATIENT_CLINIC_OR_DEPARTMENT_OTHER): Payer: Self-pay | Admitting: Foot & Ankle Surgery

## 2011-07-16 NOTE — Progress Notes (Signed)
Patient seen with student and personally examined and treated by me.      41 year old female patient of Dr. Christeen Douglas with CRPS, right foot. Patient is following up for bi-weekly nerve blocks. She states she is doing well. Pain is well controlled and she is only requiring one Nucynta tablet a week. She continues to ambulate without difficulty in regular shoes. She no longer needs to be immobilized. She describes shooting pain on bottom of foot and up leg anteriorly. She did very well with the block earlier this week. She just started to have a small amount of pain that started today.    ROS: Unremarkable. Improved pain right leg.    PMH, Meds, Allergies reviewed.     Physical Exam:  - Gen: A&Ox3, NAD  - Right LE: DP/PT pulses palpable. Gross sensation is intact. Skin coloration is wnl. No pain with light touch or ROM. No edema. No mottling of skin. No temperature changes vs. Contralateral limb.    A/P: CRPS, right foot  - Patient's name and date of birth confirmed. A posterior tibial and common peroneal nerve block were administered, each block consisting of 4.75% Marcaine with epinephrine and 0.25% Dexamethasone 4mg  into each area.  Patient tolerated procedure well with no complications. She will follow up next week for another block.

## 2011-07-19 ENCOUNTER — Encounter (HOSPITAL_BASED_OUTPATIENT_CLINIC_OR_DEPARTMENT_OTHER): Payer: Self-pay | Admitting: Internal Medicine

## 2011-07-19 NOTE — Telephone Encounter (Signed)
PRIOR AUTHORIZATION FOR Rozerem    The PA has been denied. The insurance provider sent a fax explaining why this was denied and what steps can be taken for the appeal process, which was scanned into Careers information officer.    [employer] has concluded that there are more cost-effective alternatives.

## 2011-07-20 ENCOUNTER — Other Ambulatory Visit (HOSPITAL_BASED_OUTPATIENT_CLINIC_OR_DEPARTMENT_OTHER): Payer: Self-pay

## 2011-07-20 ENCOUNTER — Other Ambulatory Visit (HOSPITAL_BASED_OUTPATIENT_CLINIC_OR_DEPARTMENT_OTHER): Payer: Self-pay | Admitting: Internal Medicine

## 2011-07-20 DIAGNOSIS — J45901 Unspecified asthma with (acute) exacerbation: Secondary | ICD-10-CM

## 2011-07-20 DIAGNOSIS — F329 Major depressive disorder, single episode, unspecified: Principal | ICD-10-CM

## 2011-07-20 DIAGNOSIS — E039 Hypothyroidism, unspecified: Secondary | ICD-10-CM

## 2011-07-20 DIAGNOSIS — F32A Depression, unspecified: Secondary | ICD-10-CM

## 2011-07-20 MED ORDER — CITALOPRAM HYDROBROMIDE 10 MG PO TABS
10.0000 mg | ORAL_TABLET | Freq: Every day | ORAL | Status: DC
Start: 2011-07-20 — End: 2011-08-31

## 2011-07-20 NOTE — Telephone Encounter (Signed)
PA denied; will review alternative options at follow up

## 2011-07-20 NOTE — Progress Notes (Signed)
Pt suffered rash and hives in face about a week into taking fluoxetine; stopped it and took benadryl and accutane and reaction and rash resolved; question of allergy to fluoxetine; pt does not feel comfortable rechallenging; had been feeling better on it; we agreed to switch to citalopram 10 mg; continues taking zolpidem CR; rozerem PA was denied, pt will run it through her secondary insurance. F/u as planned, pt comfortable with plan

## 2011-07-20 NOTE — Telephone Encounter (Signed)
Message copied by Cori Razor on Tue Jul 20, 2011  5:20 PM  ------       Message from: AMADO-LOUIS, LISA       Created: Tue Jul 20, 2011  3:49 PM       Regarding: med refill         [facility]              Person calling on behalf of patient: Pharmacy              May list multiple medications in this section       Medicine Name: hydroxyzine hcl 10 mg and levothyroxine 125 mcg       Dosage:        Frequency (how many pills, how many times a day):        Number of pills left:        Documented patient preferred pharmacies:       [facility]               Phone: [phone] Fax: [phone]               Pharmacy Name:        Pharmacy Telephone Number:        Pharmacy  Fax Number:               CALL BACK NUMBER:        Cell phone:        Other phone:              Available times:              Patient's language of care: English              Patient does not need an interpreter.

## 2011-07-20 NOTE — Telephone Encounter (Signed)
PER Pharmacy, Madison Mckay is a 41 year old female has requested a refill of medication listed above    Last Office Visit: 07/15/11  Last Physical Exam: 07/06/10      Other Med Adult:  Most Recent BP Reading(s)  07/12/11 : 122/86        No results found for this basename: cholesterol    No results found for this basename: LDL    No results found for this basename: HDL    No results found for this basename: tg        No results found for this basename: TSHSC        THYROID STIM HORMONE (uIU/mL)   Date     Date  Value    07/06/2010  0.46    ----------      No results found for this basename: hgba1c        No results found for this basename: INR       Documented patient [facility]  [facility] [place] [phone] Fax: [phone]

## 2011-07-21 ENCOUNTER — Ambulatory Visit (HOSPITAL_BASED_OUTPATIENT_CLINIC_OR_DEPARTMENT_OTHER): Payer: Medicaid Other | Admitting: Podiatrist

## 2011-07-21 ENCOUNTER — Other Ambulatory Visit (HOSPITAL_BASED_OUTPATIENT_CLINIC_OR_DEPARTMENT_OTHER): Payer: Self-pay | Admitting: Internal Medicine

## 2011-07-21 DIAGNOSIS — J45909 Unspecified asthma, uncomplicated: Secondary | ICD-10-CM

## 2011-07-21 DIAGNOSIS — G90529 Complex regional pain syndrome I of unspecified lower limb: Secondary | ICD-10-CM

## 2011-07-21 MED ORDER — EPINEPHRINE 0.3 MG/0.3ML IJ DEVI
0.3000 mg | Freq: Once | INTRAMUSCULAR | Status: AC | PRN
Start: 2011-07-21 — End: 2016-09-22

## 2011-07-21 MED ORDER — SYNTHROID 125 MCG PO TABS
125.0000 ug | ORAL_TABLET | Freq: Every morning | ORAL | Status: DC
Start: 2011-07-20 — End: 2011-11-25

## 2011-07-21 MED ORDER — ZOLPIDEM TARTRATE ER 12.5 MG PO TBCR
12.5000 mg | EXTENDED_RELEASE_TABLET | Freq: Every evening | ORAL | Status: AC | PRN
Start: 2011-07-21 — End: 2011-08-20

## 2011-07-21 MED ORDER — HYDROXYZINE HCL 25 MG PO TABS
25.0000 mg | ORAL_TABLET | Freq: Three times a day (TID) | ORAL | Status: DC | PRN
Start: 2011-07-20 — End: 2011-09-27

## 2011-07-21 NOTE — Telephone Encounter (Signed)
Message copied by Wendee Beavers on Wed Jul 21, 2011  7:53 AM  ------       Message from: Ihor Dow       Created: Tue Jul 20, 2011  6:37 PM       Regarding: REFILL         [facility]              Person calling on behalf of patient: Pharmacy              May list multiple medications in this section       Medicine Name: EPIPEN 2-PAK        Dosage: 0.3 MG AUTO-INJCT       Frequency (how many pills, how many times a day): INJECT 0.3MG  INTO THE MUSCLE ONCE AS NEEDED              Documented patient preferred pharmacies:       [facility]              Phone: [phone] Fax: [phone]                      Patient's language of care: English              Patient does not need an interpreter.

## 2011-07-21 NOTE — Progress Notes (Signed)
SUBJECTIVE:  Madison Mckay is a 41 year old female who presents for weekly f/u of RSD to her R leg. Pt reports no new complaints at this time. Pt relates that last week's injection provided slightly longer relief than prior injections of different mixtures.       MEDICATIONS:    Current outpatient prescriptions:  zolpidem (AMBIEN CR) 12.5 MG CR tablet Take 1 tablet by mouth nightly as needed for Sleep. Disp: 30 tablet Rfl: 0   citalopram (CELEXA) 10 MG tablet Take 1 tablet by mouth daily. Disp: 30 tablet Rfl: 1   urea (CARMOL) 40 % ointment Apply  topically nightly. Disp: 30 g Rfl: 1   Tapentadol HCl (NUCYNTA) 150 MG TB12 Take 1 tablet by mouth 4 (four) times daily as needed. Disp: 120 tablet Rfl: 0   DISCONTD: zolpidem (AMBIEN CR) 12.5 MG CR tablet Take 1 tablet by mouth nightly as needed for Sleep. Disp: 30 tablet Rfl: 0   lidocaine (LIDODERM) 5 % patch Place 1 patch onto the skin daily. Disp: 30 patch Rfl: 4   fluticasone-salmeterol (ADVAIR DISKUS) 500-50 MCG/DOSE AEPB Aerosol Powder Inhale 1 puff into the lungs every 12 (twelve) hours. Disp: 180 each Rfl: 4   buPROPion (WELLBUTRIN XL) 300 MG 24 hr tablet Take 1 tablet by mouth daily. Disp: 90 tablet Rfl: 4   albuterol (PROAIR HFA) 108 (90 BASE) MCG/ACT inhaler Inhale 2 puffs into the lungs every 6 (six) hours as needed for Wheezing. Disp: 1 Inhaler Rfl: 3   hydrochlorothiazide (HYDRODIURIL) 25 MG tablet Take 1 tablet by mouth daily. Disp: 90 tablet Rfl: 4   amlodipine (NORVASC) 5 MG tablet Take 1 tablet by mouth daily. Disp: 90 tablet Rfl: 4   Multiple Vitamin (MULTIVITAMIN) tablet Take 1 tablet by mouth daily. Disp: 30 tablet Rfl: 12   albuterol (PROVENTIL) (2.5 MG/3ML) 0.083% nebulizer solution Take 3 mLs by nebulization every 6 (six) hours as needed for Wheezing. Disp: 75 mL Rfl: 4   Nebulizer MISC 1 kit by Does not apply route as needed. Disp: 1 each Rfl: 0   ramelteon (ROZEREM) 8 MG tablet Take 1 tablet by mouth nightly. Disp: 30 tablet Rfl: 2    traMADol (ULTRAM) 50 MG tablet Take 1 tablet by mouth every 6 (six) hours as needed. Disp: 30 tablet Rfl: 0   gabapentin (NEURONTIN) 300 MG capsule Take 1 capsule by mouth 3 (three) times daily. Disp: 90 capsule Rfl: 2   ipratropium-albuterol (DUO-NEB) 0.5-2.5 (3) MG/3ML SOLN Inhalation Solution Take 3 mLs by nebulization 4 (four) times daily as needed. Disp: 1000 mL Rfl: 11   QUEtiapine (SEROQUEL) 25 MG tablet Take  by mouth. Take 2 tablets by mouth qhs. Then take 1 tablet bid prn anxiety. Disp: 90 tablet Rfl: 0   furosemide (LASIX) 20 MG tablet Take 1 tablet by mouth 2 (two) times daily. Disp: 60 tablet Rfl: 1   hydrOXYzine (ATARAX) 25 MG tablet Take 1 tablet by mouth every 8 (eight) hours as needed for Itching and Anxiety. Disp: 30 tablet Rfl: 0   Spacer/Aero-Holding Chambers (AEROCHAMBER MAX W/MASK MEDIUM) device Use as instructed Disp: 1 Device Rfl: 0   SYNTHROID 125 MCG tablet Take 1 tablet by mouth every morning. Disp: 90 tablet Rfl: 4   EPINEPHrine (EPIPEN 2-PAK) 0.3 MG/0.3ML Injection Device Inject 0.3 mg into the muscle once as needed. Disp: 1 each Rfl: 1   clindamycin-benzoyl peroxide (BENZACLIN) gel Apply  topically 2 (two) times daily. Disp: 50 g Rfl: 4  ALLERGIES:  Review of Patient's Allergies indicates:   Penicillins             Hives   Oxycodone                   Comment:Hives, other narcotics OK   Erythromycin            Nausea Only    Comment:Stomach pain severe no nausea, can take             azithromycin    OBJECTIVE:      PHYSICAL EXAM:  There were no vitals filed for this visit.  Derm: no open lesions noted.   Vasc: deferred.   Neurologic:  No focal deficits noted.   MSK: deferred.    ASSESSMENT:   1) reflex sympathetic dystrophy, R      PLAN:  Pt presented for routine management of RSD, R LE. Pt received R tibial nerve block at the level of the medial malleolus consisting of 4.75 cc of 0.5% bupivicaine with epi (1:200,000) and .25cc dexamethasone 4mg /ml. Pt received R common peroneal  nerve block at level of the fibular neck consisting of the same mixture. Pt dispensed prescription for Ambien. Pt to RTC in 1 week for f/u.     Electronically signed by: Venetia Constable, 07/21/2011 1:37 PM  This note is electronically signed in the electronic medical record.

## 2011-07-21 NOTE — Telephone Encounter (Signed)
Person calling on behalf of patient: Pharmacy    Madison Mckay is a 41 year old female       - medication(s) request: Epi-pen       - last office visit: 04/26/2011  - last physical exam: 07/06/2010      Other Med Adult:  Most Recent BP Reading(s)  07/12/11 : 122/86        No results found for this basename: cholesterol    No results found for this basename: LDL    No results found for this basename: HDL    No results found for this basename: tg        No results found for this basename: TSHSC        THYROID STIM HORMONE (uIU/mL)   Date     Date  Value    07/06/2010  0.46    ----------      No results found for this basename: hgba1c        No results found for this basename: INR           Documented patient [facility] [address] [place] [phone] Fax: [phone]

## 2011-07-22 LAB — FUNGAL ORGANISM IDENTIFICATION

## 2011-07-23 ENCOUNTER — Encounter (HOSPITAL_BASED_OUTPATIENT_CLINIC_OR_DEPARTMENT_OTHER): Payer: Self-pay | Admitting: Internal Medicine

## 2011-07-23 DIAGNOSIS — B009 Herpesviral infection, unspecified: Secondary | ICD-10-CM

## 2011-07-23 MED ORDER — VALACYCLOVIR HCL 500 MG PO TABS
500.0000 mg | ORAL_TABLET | Freq: Two times a day (BID) | ORAL | Status: DC
Start: 2011-07-23 — End: 2012-04-05

## 2011-07-23 NOTE — Progress Notes (Signed)
Pt seen with [facility].  I concur with their assessment, and confirm that this treatment plan is correct.

## 2011-07-23 NOTE — Telephone Encounter (Signed)
Spoke with patient  She is having fever blisters on her nose  Has had this in the past after having the shingles last year  Using abreva patches, helped some  Would like a refill of the valtrex sent to preferred pharmacy  Let her know PCP was not in clinic today but would send message  Agreed with plan

## 2011-07-23 NOTE — Telephone Encounter (Signed)
Done

## 2011-07-23 NOTE — Telephone Encounter (Signed)
Left a message on an identified voice mail  RX approved, to read mychart message from PCP

## 2011-07-24 ENCOUNTER — Encounter (HOSPITAL_BASED_OUTPATIENT_CLINIC_OR_DEPARTMENT_OTHER): Payer: Self-pay | Admitting: Internal Medicine

## 2011-07-26 NOTE — Telephone Encounter (Addendum)
RX called to [facility] is currently closed, message left  mychart message to patient

## 2011-07-26 NOTE — Telephone Encounter (Addendum)
The prescription did not make it to [facility]. Can you please resend it or call it in for me I want to start it as soon as I can. The number to [facility] is [phone]. Thank you so much.  Darl Pikes

## 2011-07-27 ENCOUNTER — Ambulatory Visit (HOSPITAL_BASED_OUTPATIENT_CLINIC_OR_DEPARTMENT_OTHER): Payer: Medicaid Other | Admitting: Podiatrist

## 2011-07-27 DIAGNOSIS — G9050 Complex regional pain syndrome I, unspecified: Secondary | ICD-10-CM

## 2011-07-28 NOTE — Progress Notes (Signed)
SUBJECTIVE:   Madison Mckay is a 40 year old female who presents for weekly f/u of RSD to her R leg. Pt reports no new complaints at this time. Pt relates that last week's injection provided slightly longer relief than prior injections of different mixtures.   MEDICATIONS:   Current outpatient prescriptions:  zolpidem (AMBIEN CR) 12.5 MG CR tablet  Take 1 tablet by mouth nightly as needed for Sleep.  Disp: 30 tablet  Rfl: 0    citalopram (CELEXA) 10 MG tablet  Take 1 tablet by mouth daily.  Disp: 30 tablet  Rfl: 1    urea (CARMOL) 40 % ointment  Apply topically nightly.  Disp: 30 g  Rfl: 1    Tapentadol HCl (NUCYNTA) 150 MG TB12  Take 1 tablet by mouth 4 (four) times daily as needed.  Disp: 120 tablet  Rfl: 0    DISCONTD: zolpidem (AMBIEN CR) 12.5 MG CR tablet  Take 1 tablet by mouth nightly as needed for Sleep.  Disp: 30 tablet  Rfl: 0    lidocaine (LIDODERM) 5 % patch  Place 1 patch onto the skin daily.  Disp: 30 patch  Rfl: 4    fluticasone-salmeterol (ADVAIR DISKUS) 500-50 MCG/DOSE AEPB Aerosol Powder  Inhale 1 puff into the lungs every 12 (twelve) hours.  Disp: 180 each  Rfl: 4    buPROPion (WELLBUTRIN XL) 300 MG 24 hr tablet  Take 1 tablet by mouth daily.  Disp: 90 tablet  Rfl: 4    albuterol (PROAIR HFA) 108 (90 BASE) MCG/ACT inhaler  Inhale 2 puffs into the lungs every 6 (six) hours as needed for Wheezing.  Disp: 1 Inhaler  Rfl: 3    hydrochlorothiazide (HYDRODIURIL) 25 MG tablet  Take 1 tablet by mouth daily.  Disp: 90 tablet  Rfl: 4    amlodipine (NORVASC) 5 MG tablet  Take 1 tablet by mouth daily.  Disp: 90 tablet  Rfl: 4    Multiple Vitamin (MULTIVITAMIN) tablet  Take 1 tablet by mouth daily.  Disp: 30 tablet  Rfl: 12    albuterol (PROVENTIL) (2.5 MG/3ML) 0.083% nebulizer solution  Take 3 mLs by nebulization every 6 (six) hours as needed for Wheezing.  Disp: 75 mL  Rfl: 4    Nebulizer MISC  1 kit by Does not apply route as needed.  Disp: 1 each  Rfl: 0    ramelteon (ROZEREM) 8 MG tablet   Take 1 tablet by mouth nightly.  Disp: 30 tablet  Rfl: 2    traMADol (ULTRAM) 50 MG tablet  Take 1 tablet by mouth every 6 (six) hours as needed.  Disp: 30 tablet  Rfl: 0    gabapentin (NEURONTIN) 300 MG capsule  Take 1 capsule by mouth 3 (three) times daily.  Disp: 90 capsule  Rfl: 2    ipratropium-albuterol (DUO-NEB) 0.5-2.5 (3) MG/3ML SOLN Inhalation Solution  Take 3 mLs by nebulization 4 (four) times daily as needed.  Disp: 1000 mL  Rfl: 11    QUEtiapine (SEROQUEL) 25 MG tablet  Take by mouth. Take 2 tablets by mouth qhs. Then take 1 tablet bid prn anxiety.  Disp: 90 tablet  Rfl: 0    furosemide (LASIX) 20 MG tablet  Take 1 tablet by mouth 2 (two) times daily.  Disp: 60 tablet  Rfl: 1    hydrOXYzine (ATARAX) 25 MG tablet  Take 1 tablet by mouth every 8 (eight) hours as needed for Itching and Anxiety.  Disp: 30 tablet  Rfl: 0      Spacer/Aero-Holding Chambers (AEROCHAMBER MAX W/MASK MEDIUM) device  Use as instructed  Disp: 1 Device  Rfl: 0    SYNTHROID 125 MCG tablet  Take 1 tablet by mouth every morning.  Disp: 90 tablet  Rfl: 4    EPINEPHrine (EPIPEN 2-PAK) 0.3 MG/0.3ML Injection Device  Inject 0.3 mg into the muscle once as needed.  Disp: 1 each  Rfl: 1    clindamycin-benzoyl peroxide (BENZACLIN) gel  Apply topically 2 (two) times daily.  Disp: 50 g  Rfl: 4    ALLERGIES:   Review of Patient's Allergies indicates:   Penicillins Hives   Oxycodone   Comment:Hives, other narcotics OK   Erythromycin Nausea Only   Comment:Stomach pain severe no nausea, can take   azithromycin   OBJECTIVE:   PHYSICAL EXAM: There were no vitals filed for this visit.   Derm: no open lesions noted.   Vasc: deferred.   Neurologic: No focal deficits noted.   MSK: deferred.   ASSESSMENT:   1) reflex sympathetic dystrophy, R   PLAN:   Pt presented for routine management of RSD, R LE. Pt received R tibial nerve block at the level of the medial malleolus consisting of 4.75 cc of 0.5% bupivicaine with epi (1:200,000) and .25cc dexamethasone  4mg/ml. Pt received R common peroneal nerve block at level of the fibular neck consisting of the same mixture. Pt dispensed prescription for Ambien. Pt to RTC in 1 week for f/u.

## 2011-07-29 ENCOUNTER — Ambulatory Visit (HOSPITAL_BASED_OUTPATIENT_CLINIC_OR_DEPARTMENT_OTHER): Payer: Medicaid Other | Admitting: Podiatrist

## 2011-07-29 DIAGNOSIS — G9050 Complex regional pain syndrome I, unspecified: Secondary | ICD-10-CM

## 2011-07-29 MED ORDER — DIAZEPAM 5 MG PO TABS
5.0000 mg | ORAL_TABLET | Freq: Two times a day (BID) | ORAL | Status: AC | PRN
Start: 2011-07-29 — End: 2011-08-29

## 2011-07-29 MED ORDER — TAPENTADOL HCL ER 150 MG PO TB12
1.0000 | ORAL_TABLET | Freq: Three times a day (TID) | ORAL | Status: DC
Start: 2011-07-29 — End: 2011-08-19

## 2011-08-01 NOTE — Progress Notes (Signed)
SUBJECTIVE:   Madison Mckay is a 41 year old female who presents for weekly f/u of RSD to her R leg. Pt reports no new complaints at this time. Pt relates that last week's injection provided slightly longer relief than prior injections of different mixtures.   MEDICATIONS:   Current outpatient prescriptions:  zolpidem (AMBIEN CR) 12.5 MG CR tablet  Take 1 tablet by mouth nightly as needed for Sleep.  Disp: 30 tablet  Rfl: 0    citalopram (CELEXA) 10 MG tablet  Take 1 tablet by mouth daily.  Disp: 30 tablet  Rfl: 1    urea (CARMOL) 40 % ointment  Apply topically nightly.  Disp: 30 g  Rfl: 1    Tapentadol HCl (NUCYNTA) 150 MG TB12  Take 1 tablet by mouth 4 (four) times daily as needed.  Disp: 120 tablet  Rfl: 0    DISCONTD: zolpidem (AMBIEN CR) 12.5 MG CR tablet  Take 1 tablet by mouth nightly as needed for Sleep.  Disp: 30 tablet  Rfl: 0    lidocaine (LIDODERM) 5 % patch  Place 1 patch onto the skin daily.  Disp: 30 patch  Rfl: 4    fluticasone-salmeterol (ADVAIR DISKUS) 500-50 MCG/DOSE AEPB Aerosol Powder  Inhale 1 puff into the lungs every 12 (twelve) hours.  Disp: 180 each  Rfl: 4    buPROPion (WELLBUTRIN XL) 300 MG 24 hr tablet  Take 1 tablet by mouth daily.  Disp: 90 tablet  Rfl: 4    albuterol (PROAIR HFA) 108 (90 BASE) MCG/ACT inhaler  Inhale 2 puffs into the lungs every 6 (six) hours as needed for Wheezing.  Disp: 1 Inhaler  Rfl: 3    hydrochlorothiazide (HYDRODIURIL) 25 MG tablet  Take 1 tablet by mouth daily.  Disp: 90 tablet  Rfl: 4    amlodipine (NORVASC) 5 MG tablet  Take 1 tablet by mouth daily.  Disp: 90 tablet  Rfl: 4    Multiple Vitamin (MULTIVITAMIN) tablet  Take 1 tablet by mouth daily.  Disp: 30 tablet  Rfl: 12    albuterol (PROVENTIL) (2.5 MG/3ML) 0.083% nebulizer solution  Take 3 mLs by nebulization every 6 (six) hours as needed for Wheezing.  Disp: 75 mL  Rfl: 4    Nebulizer MISC  1 kit by Does not apply route as needed.  Disp: 1 each  Rfl: 0    ramelteon (ROZEREM) 8 MG tablet   Take 1 tablet by mouth nightly.  Disp: 30 tablet  Rfl: 2    traMADol (ULTRAM) 50 MG tablet  Take 1 tablet by mouth every 6 (six) hours as needed.  Disp: 30 tablet  Rfl: 0    gabapentin (NEURONTIN) 300 MG capsule  Take 1 capsule by mouth 3 (three) times daily.  Disp: 90 capsule  Rfl: 2    ipratropium-albuterol (DUO-NEB) 0.5-2.5 (3) MG/3ML SOLN Inhalation Solution  Take 3 mLs by nebulization 4 (four) times daily as needed.  Disp: 1000 mL  Rfl: 11    QUEtiapine (SEROQUEL) 25 MG tablet  Take by mouth. Take 2 tablets by mouth qhs. Then take 1 tablet bid prn anxiety.  Disp: 90 tablet  Rfl: 0    furosemide (LASIX) 20 MG tablet  Take 1 tablet by mouth 2 (two) times daily.  Disp: 60 tablet  Rfl: 1    hydrOXYzine (ATARAX) 25 MG tablet  Take 1 tablet by mouth every 8 (eight) hours as needed for Itching and Anxiety.  Disp: 30 tablet  Rfl: 0  Spacer/Aero-Holding Chambers (AEROCHAMBER MAX W/MASK MEDIUM) device  Use as instructed  Disp: 1 Device  Rfl: 0    SYNTHROID 125 MCG tablet  Take 1 tablet by mouth every morning.  Disp: 90 tablet  Rfl: 4    EPINEPHrine (EPIPEN 2-PAK) 0.3 MG/0.3ML Injection Device  Inject 0.3 mg into the muscle once as needed.  Disp: 1 each  Rfl: 1    clindamycin-benzoyl peroxide (BENZACLIN) gel  Apply topically 2 (two) times daily.  Disp: 50 g  Rfl: 4    ALLERGIES:   Review of Patient's Allergies indicates:   Penicillins Hives   Oxycodone   Comment:Hives, other narcotics OK   Erythromycin Nausea Only   Comment:Stomach pain severe no nausea, can take   azithromycin   OBJECTIVE:   PHYSICAL EXAM: There were no vitals filed for this visit.   Derm: no open lesions noted.   Vasc: deferred.   Neurologic: No focal deficits noted.   MSK: deferred.   ASSESSMENT:   1) reflex sympathetic dystrophy, R   PLAN:   Pt presented for routine management of RSD, R LE. Pt received R tibial nerve block at the level of the medial malleolus consisting of 4.75 cc of 0.5% bupivicaine with epi (1:200,000) and .25cc dexamethasone  4mg /ml. Pt received R common peroneal nerve block at level of the fibular neck consisting of the same mixture. Pt dispensed prescription for Ambien. Pt to RTC in 1 week for f/u.

## 2011-08-03 ENCOUNTER — Ambulatory Visit (HOSPITAL_BASED_OUTPATIENT_CLINIC_OR_DEPARTMENT_OTHER): Payer: Medicaid Other | Admitting: Podiatrist

## 2011-08-03 DIAGNOSIS — G9050 Complex regional pain syndrome I, unspecified: Secondary | ICD-10-CM

## 2011-08-05 ENCOUNTER — Ambulatory Visit (HOSPITAL_BASED_OUTPATIENT_CLINIC_OR_DEPARTMENT_OTHER): Payer: Medicaid Other | Admitting: Podiatrist

## 2011-08-05 NOTE — Progress Notes (Signed)
SUBJECTIVE:   Madison Mckay is a 41 year old female who presents for weekly f/u of RSD to her R leg. Pt reports no new complaints at this time. Pt relates that last week's injection provided slightly longer relief than prior injections of different mixtures.   MEDICATIONS:   Current outpatient prescriptions:  zolpidem (AMBIEN CR) 12.5 MG CR tablet  Take 1 tablet by mouth nightly as needed for Sleep.  Disp: 30 tablet  Rfl: 0    citalopram (CELEXA) 10 MG tablet  Take 1 tablet by mouth daily.  Disp: 30 tablet  Rfl: 1    urea (CARMOL) 40 % ointment  Apply topically nightly.  Disp: 30 g  Rfl: 1    Tapentadol HCl (NUCYNTA) 150 MG TB12  Take 1 tablet by mouth 4 (four) times daily as needed.  Disp: 120 tablet  Rfl: 0    DISCONTD: zolpidem (AMBIEN CR) 12.5 MG CR tablet  Take 1 tablet by mouth nightly as needed for Sleep.  Disp: 30 tablet  Rfl: 0    lidocaine (LIDODERM) 5 % patch  Place 1 patch onto the skin daily.  Disp: 30 patch  Rfl: 4    fluticasone-salmeterol (ADVAIR DISKUS) 500-50 MCG/DOSE AEPB Aerosol Powder  Inhale 1 puff into the lungs every 12 (twelve) hours.  Disp: 180 each  Rfl: 4    buPROPion (WELLBUTRIN XL) 300 MG 24 hr tablet  Take 1 tablet by mouth daily.  Disp: 90 tablet  Rfl: 4    albuterol (PROAIR HFA) 108 (90 BASE) MCG/ACT inhaler  Inhale 2 puffs into the lungs every 6 (six) hours as needed for Wheezing.  Disp: 1 Inhaler  Rfl: 3    hydrochlorothiazide (HYDRODIURIL) 25 MG tablet  Take 1 tablet by mouth daily.  Disp: 90 tablet  Rfl: 4    amlodipine (NORVASC) 5 MG tablet  Take 1 tablet by mouth daily.  Disp: 90 tablet  Rfl: 4    Multiple Vitamin (MULTIVITAMIN) tablet  Take 1 tablet by mouth daily.  Disp: 30 tablet  Rfl: 12    albuterol (PROVENTIL) (2.5 MG/3ML) 0.083% nebulizer solution  Take 3 mLs by nebulization every 6 (six) hours as needed for Wheezing.  Disp: 75 mL  Rfl: 4    Nebulizer MISC  1 kit by Does not apply route as needed.  Disp: 1 each  Rfl: 0    ramelteon (ROZEREM) 8 MG tablet   Take 1 tablet by mouth nightly.  Disp: 30 tablet  Rfl: 2    traMADol (ULTRAM) 50 MG tablet  Take 1 tablet by mouth every 6 (six) hours as needed.  Disp: 30 tablet  Rfl: 0    gabapentin (NEURONTIN) 300 MG capsule  Take 1 capsule by mouth 3 (three) times daily.  Disp: 90 capsule  Rfl: 2    ipratropium-albuterol (DUO-NEB) 0.5-2.5 (3) MG/3ML SOLN Inhalation Solution  Take 3 mLs by nebulization 4 (four) times daily as needed.  Disp: 1000 mL  Rfl: 11    QUEtiapine (SEROQUEL) 25 MG tablet  Take by mouth. Take 2 tablets by mouth qhs. Then take 1 tablet bid prn anxiety.  Disp: 90 tablet  Rfl: 0    furosemide (LASIX) 20 MG tablet  Take 1 tablet by mouth 2 (two) times daily.  Disp: 60 tablet  Rfl: 1    hydrOXYzine (ATARAX) 25 MG tablet  Take 1 tablet by mouth every 8 (eight) hours as needed for Itching and Anxiety.  Disp: 30 tablet  Rfl: 0  Spacer/Aero-Holding Chambers (AEROCHAMBER MAX W/MASK MEDIUM) device  Use as instructed  Disp: 1 Device  Rfl: 0    SYNTHROID 125 MCG tablet  Take 1 tablet by mouth every morning.  Disp: 90 tablet  Rfl: 4    EPINEPHrine (EPIPEN 2-PAK) 0.3 MG/0.3ML Injection Device  Inject 0.3 mg into the muscle once as needed.  Disp: 1 each  Rfl: 1    clindamycin-benzoyl peroxide (BENZACLIN) gel  Apply topically 2 (two) times daily.  Disp: 50 g  Rfl: 4    ALLERGIES:   Review of Patient's Allergies indicates:   Penicillins Hives   Oxycodone   Comment:Hives, other narcotics OK   Erythromycin Nausea Only   Comment:Stomach pain severe no nausea, can take   azithromycin   OBJECTIVE:   PHYSICAL EXAM: There were no vitals filed for this visit.   Derm: no open lesions noted.   Vasc: deferred.   Neurologic: No focal deficits noted.   MSK: deferred.   ASSESSMENT:   1) reflex sympathetic dystrophy, R   PLAN:   Pt presented for routine management of RSD, R LE. Pt received R tibial nerve block at the level of the medial malleolus consisting of 4.75 cc of 0.5% bupivicaine with epi (1:200,000) and .25cc dexamethasone  4mg /ml. Pt received R common peroneal nerve block at level of the fibular neck consisting of the same mixture.  Pt to RTC in 1 week or less, for f/u.

## 2011-08-06 ENCOUNTER — Encounter (HOSPITAL_BASED_OUTPATIENT_CLINIC_OR_DEPARTMENT_OTHER): Payer: Self-pay | Admitting: Podiatrist

## 2011-08-06 ENCOUNTER — Ambulatory Visit (HOSPITAL_BASED_OUTPATIENT_CLINIC_OR_DEPARTMENT_OTHER): Payer: Medicaid Other | Admitting: Podiatrist

## 2011-08-06 DIAGNOSIS — G9050 Complex regional pain syndrome I, unspecified: Secondary | ICD-10-CM

## 2011-08-10 ENCOUNTER — Ambulatory Visit (HOSPITAL_BASED_OUTPATIENT_CLINIC_OR_DEPARTMENT_OTHER): Payer: PRIVATE HEALTH INSURANCE | Admitting: Podiatrist

## 2011-08-10 ENCOUNTER — Encounter (HOSPITAL_BASED_OUTPATIENT_CLINIC_OR_DEPARTMENT_OTHER): Payer: Self-pay | Admitting: Internal Medicine

## 2011-08-10 DIAGNOSIS — G9050 Complex regional pain syndrome I, unspecified: Secondary | ICD-10-CM

## 2011-08-11 NOTE — Progress Notes (Signed)
SUBJECTIVE:   Madison Mckay is a 40 year old female who presents for weekly f/u of RSD to her R leg. Pt reports no new complaints at this time. Pt relates that last week's injection provided slightly longer relief than prior injections of different mixtures.   PHYSICAL EXAM: There were no vitals filed for this visit.   Derm: no open lesions noted.   Vasc: deferred.   Neurologic: No focal deficits noted.   MSK: deferred.   ASSESSMENT:   1) reflex sympathetic dystrophy, R ;  Mild improvement is still noted.  PLAN:   Pt presented for routine management of RSD, R LE. Pt received R tibial nerve block at the level of the medial malleolus consisting of 4.75 cc of 0.5% bupivicaine with epi (1:200,000) and .25cc dexamethasone 4mg/ml. Pt received R common peroneal nerve block at level of the fibular neck consisting of the same mixture. Pt to RTC in 1 week or less, for f/u.

## 2011-08-11 NOTE — Progress Notes (Signed)
SUBJECTIVE:   Madison Mckay is a 41 year old female who presents for weekly f/u of RSD to her R leg. Pt reports no new complaints at this time. Pt relates that last week's injection provided slightly longer relief than prior injections of different mixtures.   PHYSICAL EXAM: There were no vitals filed for this visit.   Derm: no open lesions noted.   Vasc: deferred.   Neurologic: No focal deficits noted.   MSK: deferred.   ASSESSMENT:   1) reflex sympathetic dystrophy, R ;  Mild improvement is still noted.  PLAN:   Pt presented for routine management of RSD, R LE. Pt received R tibial nerve block at the level of the medial malleolus consisting of 4.75 cc of 0.5% bupivicaine with epi (1:200,000) and .25cc dexamethasone 4mg /ml. Pt received R common peroneal nerve block at level of the fibular neck consisting of the same mixture. Pt to RTC in 1 week or less, for f/u.

## 2011-08-12 ENCOUNTER — Ambulatory Visit (HOSPITAL_BASED_OUTPATIENT_CLINIC_OR_DEPARTMENT_OTHER): Payer: Medicaid Other | Admitting: Podiatrist

## 2011-08-12 ENCOUNTER — Ambulatory Visit (HOSPITAL_BASED_OUTPATIENT_CLINIC_OR_DEPARTMENT_OTHER): Payer: Medicaid Other

## 2011-08-12 ENCOUNTER — Encounter (HOSPITAL_BASED_OUTPATIENT_CLINIC_OR_DEPARTMENT_OTHER): Payer: Self-pay | Admitting: Podiatrist

## 2011-08-12 DIAGNOSIS — G9050 Complex regional pain syndrome I, unspecified: Secondary | ICD-10-CM

## 2011-08-12 DIAGNOSIS — J45901 Unspecified asthma with (acute) exacerbation: Secondary | ICD-10-CM

## 2011-08-12 DIAGNOSIS — F329 Major depressive disorder, single episode, unspecified: Principal | ICD-10-CM

## 2011-08-12 DIAGNOSIS — F32A Depression, unspecified: Secondary | ICD-10-CM

## 2011-08-12 MED ORDER — VENLAFAXINE HCL ER 37.5 MG PO CP24
ORAL_CAPSULE | ORAL | Status: DC
Start: 2011-08-12 — End: 2011-09-13

## 2011-08-12 NOTE — Progress Notes (Signed)
PSYCHIATRY OUTPATIENT PROGRESS NOTE    VISIT TYPE: Psychopharmacology        PROBLEMS which this visit addressed:   Problem 1: depression     Problem 2: anxiety          SOURCE(S) OF INFORMATION:  Patient     SUBJECTIVE FINDINGS: was doing very well on fluoxetine feeling more optimistic, driven and hopeful but after 3 weeks on it developed a rash; was switched to citalopram and pt reports not having noticed the same improvement in mood; reports feeling more dull 'blah' on it and having lost energy, feeling tired most of the time. No SI. No safety concerns. Sleeps well on zolpidem. Rozerem was not approved; pt reports claim made through worker's comp and is awaiting response. No new stressors. Still dealing with legal matters and complexities related to the evetns at her previous job.                                                    MENTAL STATUS EXAM:  Appearance: tired looking, overweight, well kempt, dressed and groomed young female who appears her stated age.  Behavior: articulate, well spoken, very pleasant, cooperative with good eye contact and social smile, spontaneous. No psychomotor agitation or retardation. No tremors or abnormal movements.  Alertness:  Fully alert   Speech:  Clear, with normal rate, volume and rhythm  Mood: 'tired"  Affect: Congruent, sad  Thought Process: logical, linear, goal directed  Thought Content:  hopeful  Perceptions:  none  Judgment/Impulse Control: good/good    Insight:  good  Cognition: grossly intact  Suicidal/Homicidal: none/none        Current outpatient prescriptions ordered prior to encounter:  Tapentadol HCl (NUCYNTA) 150 MG TB12 Take 1 tablet by mouth 3 (three) times daily. Disp: 65 tablet Rfl: 0   diazepam (VALIUM) 5 MG tablet Take 1 tablet by mouth every 12 (twelve) hours as needed for Anxiety (muscle spasm). Disp: 60 tablet Rfl: 0   hydrOXYzine (ATARAX) 25 MG tablet Take 1 tablet by mouth every 8 (eight) hours as needed for Itching and Anxiety. Disp: 30 tablet Rfl:  3   SYNTHROID 125 MCG tablet Take 1 tablet by mouth every morning. Disp: 30 tablet Rfl: 3   EPINEPHrine (EPIPEN 2-PAK) 0.3 MG/0.3ML Injection Device Inject 0.3 mg into the muscle once as needed. Disp: 1 each Rfl: 1   zolpidem (AMBIEN CR) 12.5 MG CR tablet Take 1 tablet by mouth nightly as needed for Sleep. Disp: 30 tablet Rfl: 0   citalopram (CELEXA) 10 MG tablet Take 1 tablet by mouth daily. Disp: 30 tablet Rfl: 1   ramelteon (ROZEREM) 8 MG tablet Take 1 tablet by mouth nightly. Disp: 30 tablet Rfl: 2   lidocaine (LIDODERM) 5 % patch Place 1 patch onto the skin daily. Disp: 30 patch Rfl: 4   traMADol (ULTRAM) 50 MG tablet Take 1 tablet by mouth every 6 (six) hours as needed. Disp: 30 tablet Rfl: 0   gabapentin (NEURONTIN) 300 MG capsule Take 1 capsule by mouth 3 (three) times daily. Disp: 90 capsule Rfl: 2   fluticasone-salmeterol (ADVAIR DISKUS) 500-50 MCG/DOSE AEPB Aerosol Powder Inhale 1 puff into the lungs every 12 (twelve) hours. Disp: 180 each Rfl: 4   buPROPion (WELLBUTRIN XL) 300 MG 24 hr tablet Take 1 tablet by mouth daily. Disp: 90 tablet  Rfl: 4   albuterol (PROAIR HFA) 108 (90 BASE) MCG/ACT inhaler Inhale 2 puffs into the lungs every 6 (six) hours as needed for Wheezing. Disp: 1 Inhaler Rfl: 3   hydrochlorothiazide (HYDRODIURIL) 25 MG tablet Take 1 tablet by mouth daily. Disp: 90 tablet Rfl: 4   amlodipine (NORVASC) 5 MG tablet Take 1 tablet by mouth daily. Disp: 90 tablet Rfl: 4   Multiple Vitamin (MULTIVITAMIN) tablet Take 1 tablet by mouth daily. Disp: 30 tablet Rfl: 12   QUEtiapine (SEROQUEL) 25 MG tablet Take  by mouth. Take 2 tablets by mouth qhs. Then take 1 tablet bid prn anxiety. Disp: 90 tablet Rfl: 0   furosemide (LASIX) 20 MG tablet Take 1 tablet by mouth 2 (two) times daily. Disp: 60 tablet Rfl: 1   albuterol (PROVENTIL) (2.5 MG/3ML) 0.083% nebulizer solution Take 3 mLs by nebulization every 6 (six) hours as needed for Wheezing. Disp: 75 mL Rfl: 4   Spacer/Aero-Holding Chambers (AEROCHAMBER  MAX W/MASK MEDIUM) device Use as instructed Disp: 1 Device Rfl: 0   Nebulizer MISC 1 kit by Does not apply route as needed. Disp: 1 each Rfl: 0   clindamycin-benzoyl peroxide (BENZACLIN) gel Apply  topically 2 (two) times daily. Disp: 50 g Rfl: 4             Medications taken as prescribed (n/a for psychotherapy only visits): Yes    Medication side effects:    Fluoxetine - rash  Citalopram - fatigue, sedation    Testing results:  No test results pending.        Risk behaviors: None reported.         Clinical interventions today and patient's response: med management    Dual diagnosis stage of change: No dual diagnosis      ASSESSMENT: complicated psychosocial picture with major losses in recent years, including loss of health, occupation, marriage, social network and developing depression after her second divorce greatly exacerbated after traumatic events around her accident at work and the events that followed. Chronic pain may be certainly worsened if psychological symptoms worsen.     Will augment antidepressant strategy adding an SNRI venlafaxine which could also assist with anxiety while continuing bupropion.         DIAGNOSES:  Axis I (primary): MDD, recurrent, anxiety nos  Axis II: deferred  Axis III:  neuritis, foot contussion, RSD, hypothyroidism, right shoulder pain, asthma  Axis IV: medical, occupational, legal, supports  Axis V (current):  57   Axis V (highest in past year): 60    RISK ASSESSMENT (per scale):  Suicide: 1  Violence: 1  Addiction: 1    PLAN:   - d/c fluoxetine due to rash  - d/c citalopram due to sedation  - cont bupropion XL 300 mg daily  - start venlafaxine ER 37.5 mg daily increasing to 75 mg after 2 weeks  - cont zolpidem for now, tapering down progressively as we start ramelteon 8 mg qhs  - rozerem 8 mg qhs (still not approved)  - monitor pain  - monitor legal matters  - cont indiv therapy  - rtc 6 wk        Instructions to covering prescriber: OK to re-fill if patient needs meds           Amount of time spent w/patient today: 20mn      Moises Blood, MD

## 2011-08-12 NOTE — Progress Notes (Signed)
SUBJECTIVE:   This is a 41 year old female who presents for weekly f/u of RSD to her Right leg. Patient relates the injection with the epinephrine seems to be working better. She states her pain is not improved, but is not worse and she is able to sleep well at night. She is afraid to stop the injections and have the pain come back.     ROS: Patient denies any other complaints today.  RIGHT lower extremity: Dorsalis pedis and posterior tibial pulses are palpable. There is no edema or discoloration to the extremity. The skin temperature is within normal limits. There are no open lesions. There is mild ecchymosis to the medial ankle and fibular neck where the injections have been performed.    ASSESSMENT:   Reflex sympathetic dystrophy, Right lower extremity ;  Mild improvement is still noted.    PLAN:   Performed Right tibial nerve block at the level of the medial malleolus consisting of 4.75 cc of 0.5% bupivicaine with epi (1:200,000) and .25cc dexamethasone 4mg /ml. She received Right common peroneal nerve block at level of the fibular neck consisting of the same mixture. Discussed her progress and suggested spacing out the injections further but the patient is not comfortable pursuing this yet. She will return to clinic in 1 week or less for injection treatment.

## 2011-08-17 ENCOUNTER — Ambulatory Visit (HOSPITAL_BASED_OUTPATIENT_CLINIC_OR_DEPARTMENT_OTHER): Payer: Medicaid Other | Admitting: Podiatrist

## 2011-08-17 DIAGNOSIS — G9050 Complex regional pain syndrome I, unspecified: Secondary | ICD-10-CM

## 2011-08-17 LAB — FUNGAL CULTURE

## 2011-08-18 NOTE — Progress Notes (Signed)
Patient was seen with resident Dr. Buchanan.  I concur with their assessment and treatment plan.

## 2011-08-19 ENCOUNTER — Ambulatory Visit (HOSPITAL_BASED_OUTPATIENT_CLINIC_OR_DEPARTMENT_OTHER): Payer: Medicaid Other | Admitting: Podiatrist

## 2011-08-19 DIAGNOSIS — G9050 Complex regional pain syndrome I, unspecified: Secondary | ICD-10-CM

## 2011-08-19 DIAGNOSIS — M792 Neuralgia and neuritis, unspecified: Secondary | ICD-10-CM

## 2011-08-19 DIAGNOSIS — S9030XA Contusion of unspecified foot, initial encounter: Secondary | ICD-10-CM

## 2011-08-19 MED ORDER — ZOLPIDEM TARTRATE ER 12.5 MG PO TBCR
12.5000 mg | EXTENDED_RELEASE_TABLET | Freq: Every evening | ORAL | Status: DC | PRN
Start: 2011-08-19 — End: 2011-09-27

## 2011-08-19 MED ORDER — TAPENTADOL HCL ER 150 MG PO TB12
1.0000 | ORAL_TABLET | Freq: Three times a day (TID) | ORAL | Status: DC
Start: 2011-08-19 — End: 2011-09-09

## 2011-08-20 ENCOUNTER — Ambulatory Visit (HOSPITAL_BASED_OUTPATIENT_CLINIC_OR_DEPARTMENT_OTHER): Payer: Medicaid Other | Admitting: Podiatrist

## 2011-08-20 NOTE — Progress Notes (Signed)
Date of Service: 08/17/2011    SUBJECTIVE:  The patient presents to the office today for followup for reflex sympathetic dystrophy of her right limb.  She reports that she is seeing some minor incremental improvement, but overall, still does not feel that she can get through the entire week without 2 injections.  She indicates that her pain relief lasts between 36 and 48 hours after the injection.    ASSESSMENT AND PLAN:  We will continue with seeing her biweekly for the time being and follow up with her along that scheduled.  In addition, she did notice some slight bruising at the injection sites today.    ___________________________  Reviewed and Electronically Signed By: Naoma Diener DPM  Sig Date: 08/20/2011  Sig Time: 07:30:27  Dictated By: Naoma Diener DPM  Dict Date: 08/17/2011 Dict Time: 12 55 PM    Dictation Date and Time:08/17/2011 12:55:13  Transcription Date and Time:08/17/2011 13:03:36  eScription Dictation id: [id number] Confirmation # :[id number]

## 2011-08-23 ENCOUNTER — Ambulatory Visit (HOSPITAL_BASED_OUTPATIENT_CLINIC_OR_DEPARTMENT_OTHER): Payer: Medicaid Other | Admitting: Foot & Ankle Surgery

## 2011-08-23 ENCOUNTER — Encounter (HOSPITAL_BASED_OUTPATIENT_CLINIC_OR_DEPARTMENT_OTHER): Payer: Self-pay | Admitting: Foot & Ankle Surgery

## 2011-08-23 VITALS — BP 123/87 | HR 87 | Temp 97.7°F

## 2011-08-23 DIAGNOSIS — IMO0002 Reserved for concepts with insufficient information to code with codable children: Secondary | ICD-10-CM

## 2011-08-23 NOTE — Progress Notes (Signed)
41 year old female patient of Dr. Christeen Douglas with CRPS, right foot. Patient is following up for bi-weekly nerve blocks. She states she is doing well. Pain is well controlled. She continues to ambulate without difficulty in regular shoes.     ROS: Unremarkable. Improved pain right leg.    PMH, Meds, Allergies reviewed.     Physical Exam:  - Gen: A&Ox3, NAD  - Right LE: DP/PT pulses palpable. Gross sensation is intact. Skin coloration is wnl. No pain with light touch or ROM. No edema. No mottling of skin. No temperature changes vs. Contralateral limb.    A/P: CRPS, right foot  - Patient's name and date of birth confirmed. A posterior tibial and common peroneal nerve block were administered, each block consisting of 4.75cc 0.5% Marcaine with epinephrine and 0.25cc Dexamethasone 4mg /ml  into each area.  Patient tolerated procedure well with no complications. She will follow up at the end of the week for another block.

## 2011-08-25 NOTE — Progress Notes (Signed)
SUBJECTIVE: The patient presents to the office today for followup for reflex sympathetic dystrophy of her right limb. She reports that she is seeing some minor incremental improvement, but overall, still does not feel that she can get through the entire week without 2 injections. She indicates that her pain relief lasts between 36 and 48 hours after the injection.   ASSESSMENT AND PLAN: We will continue with seeing her biweekly for the time being and follow up with her along that scheduled. In addition, she did notice some slight bruising at the injection sites today.    I administered 5 cc of Bupivicaine with epinephrine and 1/4 cc of dexamethasone to her Posterior tibial nerve and to her common peroneal nerve, right foot and leg.  She tolerated the injection well. There was some slight echymosis noted at both injection sites today.

## 2011-08-26 ENCOUNTER — Ambulatory Visit (HOSPITAL_BASED_OUTPATIENT_CLINIC_OR_DEPARTMENT_OTHER): Payer: PRIVATE HEALTH INSURANCE | Admitting: Foot & Ankle Surgery

## 2011-08-26 ENCOUNTER — Ambulatory Visit (HOSPITAL_BASED_OUTPATIENT_CLINIC_OR_DEPARTMENT_OTHER): Payer: Medicaid Other | Admitting: Internal Medicine

## 2011-08-26 DIAGNOSIS — IMO0002 Reserved for concepts with insufficient information to code with codable children: Secondary | ICD-10-CM

## 2011-08-27 ENCOUNTER — Encounter (HOSPITAL_BASED_OUTPATIENT_CLINIC_OR_DEPARTMENT_OTHER): Payer: Self-pay | Admitting: Foot & Ankle Surgery

## 2011-08-27 NOTE — Progress Notes (Signed)
41 year old female patient of Dr. Landsman's with CRPS, right foot. Patient is following up for bi-weekly nerve blocks. She states she is doing well. Pain is well controlled. She continues to ambulate without difficulty in regular shoes.     ROS: Unremarkable. Improved pain right leg.    PMH, Meds, Allergies reviewed.     Physical Exam:  - Gen: A&Ox3, NAD  - Right LE: DP/PT pulses palpable. Gross sensation is intact. Skin coloration is wnl. No pain with light touch or ROM. No edema. No mottling of skin. No temperature changes vs. Contralateral limb.    A/P: CRPS, right foot  - Patient's name and date of birth confirmed. A posterior tibial and common peroneal nerve block were administered, each block consisting of 4.75cc 0.5% Marcaine with epinephrine and 0.25cc Dexamethasone 4mg/ml  into each area.  Patient tolerated procedure well with no complications. She will follow up at the end of the week for another block.

## 2011-08-30 NOTE — Progress Notes (Addendum)
Addended by: Shelda Pal on: 08/30/2011      Modules accepted: Orders

## 2011-08-31 ENCOUNTER — Ambulatory Visit (HOSPITAL_BASED_OUTPATIENT_CLINIC_OR_DEPARTMENT_OTHER): Payer: PRIVATE HEALTH INSURANCE | Admitting: Podiatrist

## 2011-08-31 DIAGNOSIS — G9050 Complex regional pain syndrome I, unspecified: Secondary | ICD-10-CM

## 2011-08-31 MED ORDER — DIAZEPAM 5 MG PO TABS
5.0000 mg | ORAL_TABLET | Freq: Three times a day (TID) | ORAL | Status: DC | PRN
Start: 2011-08-31 — End: 2011-10-21

## 2011-08-31 NOTE — Progress Notes (Signed)
This office note has been dictated. Account number [id number]

## 2011-09-02 ENCOUNTER — Encounter (HOSPITAL_BASED_OUTPATIENT_CLINIC_OR_DEPARTMENT_OTHER): Payer: Self-pay | Admitting: Podiatrist

## 2011-09-02 ENCOUNTER — Ambulatory Visit (HOSPITAL_BASED_OUTPATIENT_CLINIC_OR_DEPARTMENT_OTHER): Payer: PRIVATE HEALTH INSURANCE | Admitting: Podiatrist

## 2011-09-02 DIAGNOSIS — G9050 Complex regional pain syndrome I, unspecified: Secondary | ICD-10-CM

## 2011-09-02 NOTE — Progress Notes (Signed)
Pt seen with [facility] Sadra.  I concur with their assessment, and confirm that this treatment plan is correct.

## 2011-09-02 NOTE — Progress Notes (Signed)
SUBJECTIVE: 41 year old female returns to clinic for weekly f/u of RSD to her right leg. Patient comes to clinic twice a week for tibial and common peroneal nerve block. She states her pain has not improved but injections keep her pain stable. Patient is not reporting pain today. Patient denies any changes to her overall health since last visit.  OBJECTIVE FINDINGS:                                                                                                                            General: Well-nourished female in no apparent distress, A&O x3.  RIGHT LOWER EXTREMITY: Skin is warm, dry and supple. No signs of open lesions, edema or discoloration noted. DP/PTpulses are palpable. CFT < 3 seconds digits 1-5. Epicritic sensation grossly intact. Muscle strength 5/5 for all muscle groups.    ASSESSMENT:  Reflex sympathetic dystrophy, right lower extremity   PLAN: Performed a focused lower extremity history and physical exam. Two nerve blocks were administered, one at the common peroneal and one at the tibial nerve with 5 mL of bupivacaine with epinephrine and 0.25 mL of dexamethasone 4 mg per mL. Patient to return to clinic next Tuesday for follow up and treatment.

## 2011-09-07 ENCOUNTER — Ambulatory Visit (HOSPITAL_BASED_OUTPATIENT_CLINIC_OR_DEPARTMENT_OTHER): Payer: PRIVATE HEALTH INSURANCE | Admitting: Podiatrist

## 2011-09-07 DIAGNOSIS — G9050 Complex regional pain syndrome I, unspecified: Secondary | ICD-10-CM

## 2011-09-09 ENCOUNTER — Ambulatory Visit (HOSPITAL_BASED_OUTPATIENT_CLINIC_OR_DEPARTMENT_OTHER): Payer: PRIVATE HEALTH INSURANCE | Admitting: Podiatrist

## 2011-09-09 ENCOUNTER — Ambulatory Visit (HOSPITAL_BASED_OUTPATIENT_CLINIC_OR_DEPARTMENT_OTHER): Payer: Medicaid Other

## 2011-09-09 DIAGNOSIS — G9050 Complex regional pain syndrome I, unspecified: Secondary | ICD-10-CM

## 2011-09-09 MED ORDER — TAPENTADOL HCL ER 150 MG PO TB12
1.0000 | ORAL_TABLET | Freq: Three times a day (TID) | ORAL | Status: DC
Start: 2011-09-09 — End: 2011-09-09

## 2011-09-09 MED ORDER — TAPENTADOL HCL ER 150 MG PO TB12
1.0000 | ORAL_TABLET | Freq: Three times a day (TID) | ORAL | Status: DC
Start: 2011-09-09 — End: 2011-09-27

## 2011-09-09 NOTE — Progress Notes (Signed)
SUBJECTIVE:   This is a 41 year old female who presents for twice weekly f/u of RSD to her Right leg. Patient states that her pain level is about the same as normal. She states she is going to start spreading out her injections to Tuesdays and Fridays to help alleviate the significant pain she experiences on Sundays and Mondays.    ROS: Patient denies any other complaints today. She is feeling slightly down and has a court date coming up on Monday.    RIGHT lower extremity: Dorsalis pedis and posterior tibial pulses are palpable. There is no edema or discoloration to the extremity. The skin temperature is within normal limits. There are no open lesions. There is mild ecchymosis to the medial ankle and fibular neck where the injections have been performed.    ASSESSMENT:   Reflex sympathetic dystrophy, Right lower extremity ;  Mild improvement is still noted.    PLAN:   Performed Right tibial nerve block at the level of the medial malleolus consisting of 4.75 cc of 0.5% bupivicaine with epi (1:200,000) and .25cc dexamethasone 4mg /ml. She received Right common peroneal nerve block at level of the fibular neck consisting of the same mixture. Patient to return to clinic next Tuesday for injection.

## 2011-09-13 ENCOUNTER — Ambulatory Visit (HOSPITAL_BASED_OUTPATIENT_CLINIC_OR_DEPARTMENT_OTHER): Payer: Medicaid Other

## 2011-09-13 DIAGNOSIS — F329 Major depressive disorder, single episode, unspecified: Principal | ICD-10-CM

## 2011-09-13 DIAGNOSIS — F32A Depression, unspecified: Secondary | ICD-10-CM

## 2011-09-13 MED ORDER — VENLAFAXINE HCL ER 75 MG PO CP24
75.00 mg | ORAL_CAPSULE | Freq: Two times a day (BID) | ORAL | Status: AC
Start: 2011-09-13 — End: 2011-12-13

## 2011-09-13 NOTE — Progress Notes (Signed)
PSYCHIATRY OUTPATIENT PROGRESS NOTE    VISIT TYPE: Psychopharmacology        PROBLEMS which this visit addressed:   Problem 1: depression     Problem 2: anxiety          SOURCE(S) OF INFORMATION:  Patient     SUBJECTIVE FINDINGS: pt got married last week and reports a great relationship with husband who is supportive.    Mood improved with venlafaxine. More motivated and hopeful. Coping well with chronic pain. Gets treatment twice a week that are very helpful. After the treatment does not rely on zolpidem to sleep.  No SI. No safety concerns. Sleeps well on zolpidem when required. No new stressors. 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     Reports having noticed some fatigue in the 2 weeks that she has been taking venlafaxine 75 mg. We discussed that higher doses is hen the norepinephrine kicks in and will proceed with a trial of increasing to 150 mg.                                                    MENTAL STATUS EXAM:  Appearance: rested looking, well kempt, dressed and groomed young female who appears her stated age.  Behavior: articulate, well spoken, very pleasant, cooperative with good eye contact and social smile, spontaneous. No psychomotor agitation or retardation. No tremors or abnormal movements.  Alertness:  Fully alert   Speech:  Clear, with normal rate, volume and rhythm  Mood: 'better"  Affect: Congruent, brighter  Thought Process: logical, linear, goal directed  Thought Content:  hopeful  Perceptions:  none  Judgment/Impulse Control: good/good    Insight:  good  Cognition: grossly intact  Suicidal/Homicidal: none/none        Current outpatient prescriptions ordered prior to encounter:  Tapentadol HCl (NUCYNTA) 150 MG TB12 Take 1 tablet by mouth 3 (three) times daily. Disp: 65 tablet Rfl: 0   diazepam (VALIUM) 5 MG tablet Take 1 tablet by mouth every 8 (eight) hours as needed for Anxiety (muscle spasm). Disp: 60 tablet Rfl: 0   zolpidem (AMBIEN CR) 12.5 MG  CR tablet Take 1 tablet by mouth nightly as needed for Sleep. Disp: 30 tablet Rfl: 0   venlafaxine (EFFEXOR-XR) 37.5 MG 24 hr capsule Take  by mouth. One daily for 2 weeks, then increase to two daily Disp: 60 capsule Rfl: 3   hydrOXYzine (ATARAX) 25 MG tablet Take 1 tablet by mouth every 8 (eight) hours as needed for Itching and Anxiety. Disp: 30 tablet Rfl: 3   SYNTHROID 125 MCG tablet Take 1 tablet by mouth every morning. Disp: 30 tablet Rfl: 3   EPINEPHrine (EPIPEN 2-PAK) 0.3 MG/0.3ML Injection Device Inject 0.3 mg into the muscle once as needed. Disp: 1 each Rfl: 1   ramelteon (ROZEREM) 8 MG tablet Take 1 tablet by mouth nightly. Disp: 30 tablet Rfl: 2   lidocaine (LIDODERM) 5 % patch Place 1 patch onto the skin daily. Disp: 30 patch Rfl: 4   traMADol (ULTRAM) 50 MG tablet Take 1 tablet by mouth every 6 (six) hours as needed. Disp: 30 tablet Rfl: 0   gabapentin (NEURONTIN) 300 MG capsule Take 1 capsule by mouth 3 (three) times daily. Disp: 90 capsule Rfl: 2   fluticasone-salmeterol (ADVAIR DISKUS) 500-50 MCG/DOSE AEPB  Aerosol Powder Inhale 1 puff into the lungs every 12 (twelve) hours. Disp: 180 each Rfl: 4   buPROPion (WELLBUTRIN XL) 300 MG 24 hr tablet Take 1 tablet by mouth daily. Disp: 90 tablet Rfl: 4   albuterol (PROAIR HFA) 108 (90 BASE) MCG/ACT inhaler Inhale 2 puffs into the lungs every 6 (six) hours as needed for Wheezing. Disp: 1 Inhaler Rfl: 3   hydrochlorothiazide (HYDRODIURIL) 25 MG tablet Take 1 tablet by mouth daily. Disp: 90 tablet Rfl: 4   amlodipine (NORVASC) 5 MG tablet Take 1 tablet by mouth daily. Disp: 90 tablet Rfl: 4   Multiple Vitamin (MULTIVITAMIN) tablet Take 1 tablet by mouth daily. Disp: 30 tablet Rfl: 12   QUEtiapine (SEROQUEL) 25 MG tablet Take  by mouth. Take 2 tablets by mouth qhs. Then take 1 tablet bid prn anxiety. Disp: 90 tablet Rfl: 0   furosemide (LASIX) 20 MG tablet Take 1 tablet by mouth 2 (two) times daily. Disp: 60 tablet Rfl: 1   albuterol (PROVENTIL) (2.5 MG/3ML)  0.083% nebulizer solution Take 3 mLs by nebulization every 6 (six) hours as needed for Wheezing. Disp: 75 mL Rfl: 4   Spacer/Aero-Holding Chambers (AEROCHAMBER MAX W/MASK MEDIUM) device Use as instructed Disp: 1 Device Rfl: 0   Nebulizer MISC 1 kit by Does not apply route as needed. Disp: 1 each Rfl: 0   clindamycin-benzoyl peroxide (BENZACLIN) gel Apply  topically 2 (two) times daily. Disp: 50 g Rfl: 4             Medications taken as prescribed (n/a for psychotherapy only visits): Yes    Medication trials/ side effects:    Fluoxetine - rash  Citalopram - fatigue, sedation  Venlafaxine - improved but some fatigue in 75 mg    Testing results:  No test results pending.        Risk behaviors: None reported.         Clinical interventions today and patient's response: med management    Dual diagnosis stage of change: No dual diagnosis      ASSESSMENT: complicated psychosocial picture with major losses in recent years, including loss of health, occupation, marriage, social network and developing depression after her second divorce greatly exacerbated after traumatic events around her accident at work and the events that followed. Chronic pain may be certainly worsened if psychological symptoms worsen.     Augmentation with SNRI venlafaxine is being helpful; will titrate dose in hopes that higher dose brings up the NE effect and counteracts the fatigue.         DIAGNOSES:  Axis I (primary): MDD, recurrent, anxiety nos  Axis II: deferred  Axis III:  neuritis, foot contussion, RSD, hypothyroidism, right shoulder pain, asthma, s/p post-hysterectomy  Axis IV: medical, occupational, legal, supports  Axis V (current):  62   Axis V (highest in past year): 60    RISK ASSESSMENT (per scale):  Suicide: 1  Violence: 1  Addiction: 1    PLAN:   - cont bupropion XL 300 mg daily  - incr venlafaxine ER to 150 mg daily and monitor for fatigue  - cont zolpidem  - monitor pain  - cont indiv therapy  - rtc 6 wk        Instructions to  covering prescriber: OK to re-fill if patient needs meds          I have spent 15 minutes in face to face time with this patient/patient proxy of which > 50% was in counseling or  coordination of care regarding above issues/Dx.        Moises Blood, MD

## 2011-09-13 NOTE — Progress Notes (Signed)
Patient was seen with resident Dr. Buchanan.  I concur with their assessment and treatment plan.

## 2011-09-14 ENCOUNTER — Ambulatory Visit (HOSPITAL_BASED_OUTPATIENT_CLINIC_OR_DEPARTMENT_OTHER): Payer: Medicaid Other | Admitting: Podiatrist

## 2011-09-14 DIAGNOSIS — M79673 Pain in unspecified foot: Secondary | ICD-10-CM

## 2011-09-14 DIAGNOSIS — G9050 Complex regional pain syndrome I, unspecified: Secondary | ICD-10-CM

## 2011-09-15 NOTE — Progress Notes (Signed)
This office note has been dictated. Account number [id number]

## 2011-09-16 ENCOUNTER — Ambulatory Visit (HOSPITAL_BASED_OUTPATIENT_CLINIC_OR_DEPARTMENT_OTHER): Payer: Medicaid Other | Admitting: Internal Medicine

## 2011-09-16 ENCOUNTER — Encounter (HOSPITAL_BASED_OUTPATIENT_CLINIC_OR_DEPARTMENT_OTHER): Payer: Self-pay | Admitting: Podiatrist

## 2011-09-16 ENCOUNTER — Ambulatory Visit (HOSPITAL_BASED_OUTPATIENT_CLINIC_OR_DEPARTMENT_OTHER): Payer: Medicaid Other | Admitting: Podiatrist

## 2011-09-16 VITALS — BP 119/81 | HR 83

## 2011-09-16 DIAGNOSIS — G9050 Complex regional pain syndrome I, unspecified: Secondary | ICD-10-CM

## 2011-09-16 NOTE — Progress Notes (Signed)
S: 41 year old female presents for twice weekly follow up of reflex sympathetic dystrophy syndrome of her right leg. Patient states that her pain has slightly improved since the last time she was here two days ago. She still reports diffuse tenderness to the balls of her foot. The ankle boot that was dispensed on Tuesday has helped alleviate some pain. Patient denies any other pedal complaints today.     ROS: Patient denies fevers, chills, nausea, vomiting, shortness of breath, or chest pain. All other review of systems are negative.     O:   General: Well developed and well nourished. Appears in no acute distress. A&Ox3  Focused RIGHT lower extremity:   Vasc: DP/PT pulse 2/4. CFT <3 seconds to all digits. No signs of edema or varicosities noted.   Neuro: Gross sensation to light touch intact.   Derm: Skin is warm, dry and supple to touch. No open lesions, erythema, ecchymosis noted.   MSK: 5/5 muscle strength in all 4 pedal quadrants. Mild tenderness to palpation of plantar metatarsal heads and dorsum of forefoot.     A/P: 41 year old female with reflex sympathetic dystrophy of right lower extremity. Mild to little improvement is still noted.   - Patient was seen and examined.   - Right tibial nerve block at the level of the medial malleolus and right common peroneal nerve block at the level of the fibular neck was done without incident. Each nerve block was done with 4.75 cc of 0.5% Bupivicaine with Epi (1:200,000) and 0.25 cc of Dexamethasone 4 mg/ml.   - Patient will return to clinic next Tuesday and Friday for injection.     Heloise Beecham, MS3  Patient was seen and examined with Dr. Rosilyn Mings

## 2011-09-17 NOTE — Progress Notes (Signed)
Pt seen with PMS[facility] Hong.  I concur with their assessment, and confirm that this treatment plan is correct.

## 2011-09-18 NOTE — Progress Notes (Signed)
This office note has been dictated. Account number [id number]

## 2011-09-21 ENCOUNTER — Ambulatory Visit (HOSPITAL_BASED_OUTPATIENT_CLINIC_OR_DEPARTMENT_OTHER): Payer: Medicaid Other | Admitting: Podiatrist

## 2011-09-21 ENCOUNTER — Encounter (HOSPITAL_BASED_OUTPATIENT_CLINIC_OR_DEPARTMENT_OTHER): Payer: Self-pay | Admitting: Podiatrist

## 2011-09-21 DIAGNOSIS — G9050 Complex regional pain syndrome I, unspecified: Secondary | ICD-10-CM

## 2011-09-21 NOTE — Progress Notes (Signed)
S: 41 year old female presents for twice weekly follow-up nerve block injections for reflex sympathetic dystrophy syndrome of her right leg. Patient states that her ankle has significantly atrophied since ankle boot use beginning last week. She complains about her right ankle slipping in the boot causing shear rubbing on the dorsum of her right ankle. She has tried using multiple socks and ACE bandages to help stabilize her ankle without much success. She continues to report diffuse tenderness to the balls of her foot. Patient denies any other pedal complaints today.   ROS: Patient denies fevers, chills, nausea, vomiting, shortness of breath, or chest pain. All other review of systems are negative.     O:   General: Well-developed and well-nourished. Appears in no acute distress. A&Ox3   Focused RIGHT lower extremity:   Vasc: DP/PT pulse 2/4. CFT <3 seconds to all digits. No signs of edema or varicosities noted.   Neuro: Gross sensation intact to light touch.   Derm: Skin is warm, dry and supple to touch. No open lesions, erythema, ecchymosis noted.   MSK: 5/5 muscle strength in all 4 pedal quadrants. Mild tenderness to palpation of plantar metatarsal heads and dorsum of forefoot.     A/P: 41 year old female with reflex sympathetic dystrophy of right lower extremity. Mild to little improvement is still noted.   - Patient was seen and examined.   - Right tibial nerve block at the level of the medial malleolus and right common peroneal nerve block at the level of the fibular neck was done without incident. Each nerve block was done with 4.75 cc of 0.5% Bupivicaine with Epi (1:200,000) and 0.25 cc of Dexamethasone 4 mg/ml.   - Adjusted ankle boot with adhesive padding to allow for better stabilization of ankle in boot.   - Patient will return to clinic this Friday for follow-up.     Patient was seen and examined with: Dr. Rosilyn Mings

## 2011-09-21 NOTE — Progress Notes (Signed)
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  She continues to show stable changes in her pain level.  She still has significant pain if she does not have an injection; however, her pain does not appear to be getting worse.  At this time, however, she is having significant pain which appears to be sciatica at this time.  Pain is radiating up her posterior right foot and leg all the way up to her hip, and this is quite uncomfortable for her.  I did recommend some stretching regimens for her.  She will give this a try.    Today, we have performed common peroneal and posterior tibial nerve blocks utilizing 0.5 mL of dexamethasone 4 mg/mL and 5 mL of bupivacaine with epinephrine at each site.  _____ for her to continue to ambulate to tolerance, and we will follow up with her as indicated.    ___________________________  Reviewed and Electronically Signed By: Naoma Diener DPM  Sig Date: 09/21/2011  Sig Time: 12:10:48  Dictated By: Naoma Diener DPM  Dict Date: 09/18/2011 Dict Time: 04 48 PM    Dictation Date and Time:09/18/2011 16:48:12  Transcription Date and Time:09/18/2011 19:21:08  eScription Dictation id: [id number] Confirmation # :[id number]

## 2011-09-21 NOTE — Progress Notes (Signed)
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  She indicates that the pain has been excruciating over the last 24 hours, and she was unable to sleep.    SUBJECTIVE:  Clinical examination of the right foot as compared to the left does reveal some slight bluish tinge in the distal toes of the right foot as compared to the left.  The rest of the foot, however, feels normal in temperature.  She does not have any significant mottling in the skin noted, either.    PLAN:  Based on her severity of pain today, I have recommended and dispensed a short fixed ankle walking boot.  She does indicate that this gives her some immediate relief.  I have also performed a posterior tibial and a common peroneal nerve block utilizing at each site 5 mL of bupivacaine with epinephrine and 0.25 mL of dexamethasone 4 mg per mL.  She tolerated the injections well without any complications noted.  We will follow up with her in 2-3 days for repeat injection on a p.r.n. basis.    ___________________________  Reviewed and Electronically Signed By: Naoma Diener DPM  Sig Date: 09/21/2011  Sig Time: 12:10:14  Dictated By: Naoma Diener DPM  Dict Date: 09/14/2011 Dict Time: 05 29 PM    Dictation Date and Time:09/14/2011 17:29:58  Transcription Date and Time:09/14/2011 20:17:55  eScription Dictation id: [id number] Confirmation # :[id number]

## 2011-09-21 NOTE — Progress Notes (Signed)
Date of Service: 08/31/2011    SUBJECTIVE:  The patient presents to the office today for followup for reflex sympathetic dystrophy.  Since her last visit, she reports she has had some relief; however, she got married this last weekend and has been more active than normal.  In addition, she indicates that she wore shoes with a little bit higher heel than she normally does and this also appears to be causing her some additional discomfort.    ASSESSMENT AND PLAN:  Today, I have administered 2 nerve blocks, one at the common peroneal and one at the posterior tibial sites with 5 mL of bupivacaine with epinephrine and 0.25 mL of dexamethasone 4 mg per mL to each site.  In addition, I have renewed her prescription for diazepam.  Plan is for her to follow up with [place] later this week for additional treatment as necessary.  We also discussed the use of a TENS unit.  She indicates that she already is using this on a daily basis in the evenings when she gets home.    ___________________________  Reviewed and Electronically Signed By: Naoma Diener DPM  Sig Date: 09/21/2011  Sig Time: 12:07:07  Dictated By: Naoma Diener DPM  Dict Date: 08/31/2011 Dict Time: 07 16 PM    Dictation Date and Time:08/31/2011 19:16:50  Transcription Date and Time:08/31/2011 20:34:20  eScription Dictation id: [id number] Confirmation # :[id number]

## 2011-09-27 ENCOUNTER — Ambulatory Visit (HOSPITAL_BASED_OUTPATIENT_CLINIC_OR_DEPARTMENT_OTHER): Payer: Medicaid Other | Admitting: Podiatrist

## 2011-09-27 DIAGNOSIS — M79673 Pain in unspecified foot: Secondary | ICD-10-CM

## 2011-09-27 DIAGNOSIS — G9050 Complex regional pain syndrome I, unspecified: Secondary | ICD-10-CM

## 2011-09-27 MED ORDER — TRAMADOL HCL 50 MG PO TABS
50.0000 mg | ORAL_TABLET | Freq: Two times a day (BID) | ORAL | Status: DC | PRN
Start: 2011-09-27 — End: 2011-10-25

## 2011-09-27 MED ORDER — TAPENTADOL HCL ER 150 MG PO TB12
1.0000 | ORAL_TABLET | Freq: Four times a day (QID) | ORAL | Status: DC
Start: 2011-09-27 — End: 2012-01-10

## 2011-09-27 MED ORDER — ZOLPIDEM TARTRATE ER 12.5 MG PO TBCR
12.5000 mg | EXTENDED_RELEASE_TABLET | Freq: Every evening | ORAL | Status: DC | PRN
Start: 2011-09-27 — End: 2011-10-25

## 2011-09-27 MED ORDER — HYDROXYZINE HCL 10 MG PO TABS
10.00 mg | ORAL_TABLET | Freq: Four times a day (QID) | ORAL | Status: AC | PRN
Start: 2011-09-27 — End: 2011-10-17

## 2011-09-27 MED ORDER — BUPIVACAINE HCL 0.5 % IJ SOLN
5.00 mL | Freq: Once | INTRAMUSCULAR | Status: AC
Start: 2011-09-27 — End: 2011-09-27

## 2011-09-27 NOTE — Progress Notes (Signed)
This office note has been dictated. Account number [id number]

## 2011-09-27 NOTE — Progress Notes (Addendum)
Addended byRosilyn Mings, Lindsay Soulliere on: 09/27/2011      Modules accepted: Orders, Level of Service

## 2011-09-27 NOTE — Progress Notes (Signed)
Patient was seen with [facility].  I concur with their assessment and treatment plan.

## 2011-10-01 ENCOUNTER — Ambulatory Visit (HOSPITAL_BASED_OUTPATIENT_CLINIC_OR_DEPARTMENT_OTHER): Payer: Medicaid Other | Admitting: Podiatrist

## 2011-10-01 ENCOUNTER — Ambulatory Visit (HOSPITAL_BASED_OUTPATIENT_CLINIC_OR_DEPARTMENT_OTHER): Payer: Self-pay | Admitting: Podiatrist

## 2011-10-01 DIAGNOSIS — G9050 Complex regional pain syndrome I, unspecified: Secondary | ICD-10-CM

## 2011-10-05 ENCOUNTER — Ambulatory Visit (HOSPITAL_BASED_OUTPATIENT_CLINIC_OR_DEPARTMENT_OTHER): Payer: Medicaid Other | Admitting: Podiatrist

## 2011-10-05 DIAGNOSIS — G9050 Complex regional pain syndrome I, unspecified: Secondary | ICD-10-CM

## 2011-10-05 DIAGNOSIS — L508 Other urticaria: Secondary | ICD-10-CM

## 2011-10-05 MED ORDER — BUPIVACAINE HCL 0.5 % IJ SOLN
5.00 mL | Freq: Once | INTRAMUSCULAR | Status: AC
Start: 2011-10-05 — End: 2011-10-05

## 2011-10-05 MED ORDER — HYDROCORTISONE 2.5 % EX CREA
TOPICAL_CREAM | Freq: Two times a day (BID) | CUTANEOUS | Status: AC
Start: 2011-10-05 — End: 2011-10-19

## 2011-10-05 NOTE — Progress Notes (Signed)
This office note has been dictated. Account number [id number]

## 2011-10-06 MED ORDER — LIDOCAINE HCL 1 % IJ SOLN
5.00 mL | Freq: Once | INTRAMUSCULAR | Status: AC
Start: 2011-10-06 — End: 2011-10-06

## 2011-10-06 NOTE — Progress Notes (Signed)
This office note has been dictated. Account number [id number]

## 2011-10-06 NOTE — Progress Notes (Signed)
Date of Service: 09/27/2011    SUBJECTIVE:  The patient presents to the office today for followup for reflex sympathetic dystrophy.  She indicates that she has had severe pain since her last injection, and it appears that the injections are lasting less and less time from visit to visit.  Her foot does feel warm today with no evidence of infection and the temperature appears to be within normal limits.  There is no mottling discoloration of the skin at this time, and clinically she appears to be doing well with regard to her reflex sympathetic dystrophy; however, her functional abilities appear to have decreased since her last visit.  She denies fever, chills, nausea, vomiting, night sweats, and shortness of breath.  She does have a hoarse throat today and some malaise consistent with possible flu-type symptoms.  She reports that she is having difficulty sleeping and has increased her use of the Nucynta slightly.  She also has requested some additional relief for breakthrough pain as well.    PAST MEDICAL HISTORY:  Otherwise unchanged since her last visit.      ASSESSMENT AND PLAN:  I am concerned that symptomatically she appears to be rebounding.  We will send her for radiographs today to rule out additional evidence of spotty osteoporosis, which would indicate progression of her reflex sympathetic dystrophy.  I have also withheld the dexamethasone in the area and have just utilized the bupivacaine block today.  Plan is for her to follow up with [place] later this week so we can further assess her condition.    ___________________________  Reviewed and Electronically Signed By: Naoma Diener DPM  Sig Date: 10/06/2011  Sig Time: 13:55:44  Dictated By: Naoma Diener DPM  Dict Date: 09/27/2011 Dict Time: 08 32 PM    Dictation Date and Time:09/27/2011 20:32:44  Transcription Date and Time:09/28/2011 09:40:56  eScription Dictation id: [id number] Confirmation # :[id number]

## 2011-10-06 NOTE — Progress Notes (Signed)
Date of Service: 10/05/2011    SUBJECTIVE:  The patient presents to the office today for followup for reflex sympathetic dystrophy in her right foot and leg.  She reports that over the weekend her pain became significant once again.    OBJECTIVE:  Clinical examination today reveals some slight edema and mottling of the skin; however, this still remains somewhat improved since her last visit.      PLAN:  Today, I have administered 5 mL of bupivacaine with epinephrine to both her posterior tibial nerve and to her common peroneal nerve.  She tolerated the injections well without any difficulties noted.  She will follow up with [place] in 4 days for additional care as needed.    ___________________________  Reviewed and Electronically Signed By: Naoma Diener DPM  Sig Date: 10/06/2011  Sig Time: 13:57:29  Dictated By: Naoma Diener DPM  Dict Date: 10/05/2011 Dict Time: 10 36 PM    Dictation Date and Time:10/05/2011 22:36:06  Transcription Date and Time:10/06/2011 00:30:04  eScription Dictation id: [id number] Confirmation # :[id number]

## 2011-10-06 NOTE — Progress Notes (Signed)
Date of Service: 10/01/2011    The patient presents to the office today for followup for reflex sympathetic dystrophy in her right limb.  Radiographs were reviewed today, which do not demonstrate any signs of bony osteoporosis or other signs of bony disruption in the area.  She also has minimal blanching of her skin today.  She reports that her pain is slightly diminished from her last visit, but it is still quite uncomfortable.  We did discuss and review her treatment options.  She is already doing physical therapy.  She is already doing a TENS unit.  She is already doing massage, and we are doing regional nerve blocks.  We did discuss spinal nerve blocks today, as well.  She will take this under consideration but would like to pass on it for the time being.  I did explain to her the benefit of spinal blocks, and she indicates that she understands and would just like to think about it a little bit longer.  Plan is for her to follow up with [place] on a p.r.n. basis.  Five mL of bupivacaine with epinephrine were administered to the common peroneal and to the posterior tibial nerves of the right leg.  She tolerated the 2 nerve blocks well without any complications noted.  Plan is for her to follow up with [place] in 4-5 days.    ___________________________  Reviewed and Electronically Signed By: Naoma Diener DPM  Sig Date: 10/06/2011  Sig Time: 13:57:14  Dictated By: Naoma Diener DPM  Dict Date: 10/04/2011 Dict Time: 04 40 PM    Dictation Date and Time:10/04/2011 16:40:24  Transcription Date and Time:10/04/2011 17:36:56  eScription Dictation id: [id number] Confirmation # :[id number]

## 2011-10-07 LAB — XR FOOT RIGHT MINIMUM 3 VIEWS

## 2011-10-07 LAB — XR TIBIA FIBULA RIGHT 2 VIEWS

## 2011-10-08 ENCOUNTER — Ambulatory Visit (HOSPITAL_BASED_OUTPATIENT_CLINIC_OR_DEPARTMENT_OTHER): Payer: Medicaid Other | Admitting: Podiatrist

## 2011-10-12 ENCOUNTER — Ambulatory Visit (HOSPITAL_BASED_OUTPATIENT_CLINIC_OR_DEPARTMENT_OTHER): Payer: Medicaid Other | Admitting: Podiatrist

## 2011-10-12 DIAGNOSIS — M79673 Pain in unspecified foot: Secondary | ICD-10-CM

## 2011-10-12 DIAGNOSIS — G9050 Complex regional pain syndrome I, unspecified: Secondary | ICD-10-CM

## 2011-10-13 NOTE — Progress Notes (Signed)
This office note has been dictated. Account number [id number]

## 2011-10-15 ENCOUNTER — Ambulatory Visit (HOSPITAL_BASED_OUTPATIENT_CLINIC_OR_DEPARTMENT_OTHER): Payer: Medicaid Other | Admitting: Podiatrist

## 2011-10-15 DIAGNOSIS — G9050 Complex regional pain syndrome I, unspecified: Secondary | ICD-10-CM

## 2011-10-15 NOTE — Progress Notes (Signed)
This office note has been dictated. Account number [id number]

## 2011-10-16 NOTE — Progress Notes (Signed)
Date of Service: 10/15/2011    SUBJECTIVE:  The patient presents to the office today with continued pain in her right foot and leg secondary to reflex sympathetic dystrophy.      PROCEDURE:  Common peroneal and posterior tibial nerve blocks were performed utilizing 5 mL of bupivacaine with epinephrine as well as 0.125 mL of dexamethasone 4 mg per mL.  She tolerated the injections well without any complications noted.      PLAN:  She reports that her pain has been stable but still severe by the time she gets around to the injection days.  We did discuss a longer-acting local anesthetic, and we are taking measures to see if we can secure this for her.  Plan is to follow up with her in 4-7 days for additional injections as needed.    ___________________________  Reviewed and Electronically Signed By: Naoma Diener DPM  Sig Date: 11/09/2011  Sig Time: 09:46:19  Dictated By: Naoma Diener DPM  Dict Date: 10/15/2011 Dict Time: 02 25 PM    Dictation Date and Time:10/15/2011 14:25:26  Transcription Date and Time:10/15/2011 16:08:27  eScription Dictation id: [id number] Confirmation # :[id number]      DICTATED BY: Coreyon Nicotra S [facility] DPMADAM S [facility] DPMD:10/15/2011 14:25:26 T:10/15/2011 16:08:27 1N Job#: [id number]

## 2011-10-16 NOTE — Progress Notes (Signed)
Date of Service: 10/12/2011    SUBJECTIVE:  The patient presents to the office today for continued severe pain in her right foot and lower leg.    PLAN:  We have discussed several treatment options.  We will look into an extended-release type of bupivacaine for her, as well as possible consideration of methadone, at her next visit.  In the meantime, we will perform a common peroneal and posterior tibial nerve block utilizing 5 mL of bupivacaine with epinephrine and 0.25 mL of dexamethasone.  She tolerated the injections well without any complications noted.  She will follow up with [place] in 4 days.    ___________________________  Reviewed and Electronically Signed By: Naoma Diener DPM  Sig Date: 11/09/2011  Sig Time: 09:45:50  Dictated By: Naoma Diener DPM  Dict Date: 10/12/2011 Dict Time: 01 06 PM    Dictation Date and Time:10/12/2011 13:06:49  Transcription Date and Time:10/12/2011 15:41:37  eScription Dictation id: [id number] Confirmation # :[id number]      DICTATED BY: Tarshia Kot S [facility] DPMADAM S [facility] DPMD:10/12/2011 13:06:49 T:10/12/2011 15:41:37 CN Job#: [id number]

## 2011-10-19 ENCOUNTER — Encounter (HOSPITAL_BASED_OUTPATIENT_CLINIC_OR_DEPARTMENT_OTHER): Payer: Self-pay | Admitting: Podiatrist

## 2011-10-19 ENCOUNTER — Ambulatory Visit (HOSPITAL_BASED_OUTPATIENT_CLINIC_OR_DEPARTMENT_OTHER): Payer: Medicaid Other | Admitting: Podiatrist

## 2011-10-20 ENCOUNTER — Encounter (HOSPITAL_BASED_OUTPATIENT_CLINIC_OR_DEPARTMENT_OTHER): Payer: Self-pay | Admitting: Podiatrist

## 2011-10-21 ENCOUNTER — Encounter (HOSPITAL_BASED_OUTPATIENT_CLINIC_OR_DEPARTMENT_OTHER): Payer: Self-pay | Admitting: Podiatrist

## 2011-10-21 ENCOUNTER — Ambulatory Visit (HOSPITAL_BASED_OUTPATIENT_CLINIC_OR_DEPARTMENT_OTHER): Payer: Medicaid Other | Admitting: Podiatrist

## 2011-10-21 VITALS — BP 122/86 | HR 90 | Temp 98.1°F

## 2011-10-21 DIAGNOSIS — I7300 Raynaud's syndrome without gangrene: Secondary | ICD-10-CM

## 2011-10-21 DIAGNOSIS — G9050 Complex regional pain syndrome I, unspecified: Secondary | ICD-10-CM

## 2011-10-21 MED ORDER — TAPENTADOL HCL ER 150 MG PO TB12
1.0000 | ORAL_TABLET | Freq: Four times a day (QID) | ORAL | Status: DC
Start: 2011-10-21 — End: 2011-11-19

## 2011-10-21 MED ORDER — DIAZEPAM 5 MG PO TABS
5.0000 mg | ORAL_TABLET | Freq: Three times a day (TID) | ORAL | Status: DC | PRN
Start: 2011-10-21 — End: 2011-12-03

## 2011-10-22 MED ORDER — DEXAMETHASONE SODIUM PHOSPHATE 4 MG/ML IJ SOLN
2.00 mg | INTRAMUSCULAR | Status: AC
Start: 2011-10-25 — End: 2011-10-26
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
## 2011-10-25 ENCOUNTER — Ambulatory Visit (HOSPITAL_BASED_OUTPATIENT_CLINIC_OR_DEPARTMENT_OTHER): Payer: Medicaid Other | Admitting: Podiatrist

## 2011-10-25 DIAGNOSIS — G9050 Complex regional pain syndrome I, unspecified: Secondary | ICD-10-CM

## 2011-10-25 DIAGNOSIS — J45901 Unspecified asthma with (acute) exacerbation: Secondary | ICD-10-CM

## 2011-10-25 MED ORDER — OXYCODONE HCL ER 10 MG PO TB12
10.0000 mg | ORAL_TABLET | Freq: Every day | ORAL | Status: DC
Start: 2011-10-25 — End: 2011-12-10

## 2011-10-25 MED ORDER — ZOLPIDEM TARTRATE ER 6.25 MG PO TBCR
6.2500 mg | EXTENDED_RELEASE_TABLET | Freq: Every evening | ORAL | Status: DC | PRN
Start: 2011-10-25 — End: 2011-11-19

## 2011-10-25 MED ORDER — ZOLPIDEM TARTRATE 5 MG PO TABS
5.0000 mg | ORAL_TABLET | Freq: Every evening | ORAL | Status: DC | PRN
Start: 2011-10-25 — End: 2011-11-19

## 2011-10-25 MED ORDER — HYDROXYZINE HCL 25 MG PO TABS
25.0000 mg | ORAL_TABLET | Freq: Three times a day (TID) | ORAL | Status: DC | PRN
Start: 2011-10-25 — End: 2012-02-11

## 2011-10-26 ENCOUNTER — Ambulatory Visit (HOSPITAL_BASED_OUTPATIENT_CLINIC_OR_DEPARTMENT_OTHER): Payer: Medicaid Other

## 2011-10-27 ENCOUNTER — Ambulatory Visit (HOSPITAL_BASED_OUTPATIENT_CLINIC_OR_DEPARTMENT_OTHER): Payer: Medicaid Other | Admitting: Podiatrist

## 2011-10-29 ENCOUNTER — Ambulatory Visit (HOSPITAL_BASED_OUTPATIENT_CLINIC_OR_DEPARTMENT_OTHER): Payer: Medicaid Other | Admitting: Podiatrist

## 2011-10-29 DIAGNOSIS — M79673 Pain in unspecified foot: Secondary | ICD-10-CM

## 2011-10-29 DIAGNOSIS — I7300 Raynaud's syndrome without gangrene: Secondary | ICD-10-CM

## 2011-10-29 DIAGNOSIS — G9050 Complex regional pain syndrome I, unspecified: Secondary | ICD-10-CM

## 2011-11-01 MED ORDER — DEXAMETHASONE SODIUM PHOSPHATE 4 MG/ML IJ SOLN
2.00 mg | Freq: Once | INTRAMUSCULAR | Status: AC
Start: 2011-11-01 — End: 2011-11-01

## 2011-11-01 NOTE — Progress Notes (Signed)
Date of Service: 10/29/2011    SUBJECTIVE:  The patient presents to the office today for followup for reflex sympathetic dystrophy in her right foot and leg.  She continues to have significant discomfort.  She has an appointment scheduled with Dr. Lenor Derrick for further evaluation of this condition in the meantime.  I have advised her that I would like her internist to take over the prescribing of her medications not related directly to her foot in particular.  She may require additional changes to her calcium channel blockers for her Raynauds condition.  I told her I did not feel comfortable making those type of prescriptions for her.  Today, we have performed a common peroneal and posterior tibial nerve blocks utilizing a total of 5 mL of 0.5% bupivacaine with epinephrine and 0.25 mL of dexamethasone 4 mg per mL.  She tolerated the injection well without any complications noted.  The patient will follow up with [place] next week on a p.r.n. basis.    ___________________________  Reviewed and Electronically Signed By: Naoma Diener DPM  Sig Date: 11/09/2011  Sig Time: 09:48:00  Dictated By: Naoma Diener DPM  Dict Date: 11/01/2011 Dict Time: 09 34 PM    Dictation Date and Time:11/01/2011 21:34:04  Transcription Date and Time:11/01/2011 23:27:27  eScription Dictation id: [id number] Confirmation # :[id number]      DICTATED BY: Xian Apostol S Adabella Stanis DPMADAM S [facility] DPMD:11/01/2011 21:34:04 T:11/01/2011 23:27:27 2N Job#: [id number]

## 2011-11-01 NOTE — Progress Notes (Signed)
This office note has been dictated. Account number [id number]  Physical Exam:  Pt has palpable dorsalis pedis and posterior tibial pulses.  Sensorium is grossly intact.  Pt has 5/5 muscle strength bilaterally, for lower extremity based on manual muscle testing of DF/PF, Inversion/Eversion, External/Internal rotation, and contraction/extension of feet.  Pt is (-) for calf pain with direct compression.  Pt denies f/c/n/v/sob. Pt is awake, alert and oriented, and is able to communicate their concerns and appears to understand my responses.    Pt is in no acute distress, and does not report any acute trauma.    PMH is discussed, and is unchanged since their last doctor's appointment.      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     Comment: crush injury right foot

## 2011-11-02 ENCOUNTER — Ambulatory Visit (HOSPITAL_BASED_OUTPATIENT_CLINIC_OR_DEPARTMENT_OTHER): Payer: Medicaid Other | Admitting: Podiatrist

## 2011-11-02 DIAGNOSIS — M79673 Pain in unspecified foot: Secondary | ICD-10-CM

## 2011-11-02 DIAGNOSIS — G9050 Complex regional pain syndrome I, unspecified: Secondary | ICD-10-CM

## 2011-11-02 MED ORDER — DEXAMETHASONE SODIUM PHOSPHATE 4 MG/ML IJ SOLN
2.00 mg | Freq: Once | INTRAMUSCULAR | Status: AC
Start: 2011-11-02 — End: 2011-11-02

## 2011-11-02 NOTE — Progress Notes (Signed)
Date of Service: 11/02/2011    SUBJECTIVE:  The patient presents to the office today for followup for reflex sympathetic dystrophy.  She has had some slight improvement since her last visit.  The only change has been that she has transitioned out of her fixed ankle boot to a softer shoe, and this appears to have given her some relief.  We have performed a common peroneal and posterior tibial nerve block utilizing a combination of bupivacaine with epinephrine and 0.25 mL of dexamethasone 4 mg per mL.    PLAN:  For her to follow up with [place] in approximately 4-5 days on a p.r.n. basis.    ___________________________  Reviewed and Electronically Signed By: Naoma Diener DPM  Sig Date: 11/09/2011  Sig Time: 09:48:29  Dictated By: Naoma Diener DPM  Dict Date: 11/02/2011 Dict Time: 08 44 PM    Dictation Date and Time:11/02/2011 20:44:24  Transcription Date and Time:11/02/2011 20:53:41  eScription Dictation id: [id number] Confirmation # :[id number]      DICTATED BY: Sandi Towe S [facility] DPMADAM S [facility] DPMD:11/02/2011 20:44:24 T:11/02/2011 20:53:41 2N Job#: [id number]

## 2011-11-02 NOTE — Progress Notes (Signed)
This office note has been dictated. Account number [id number]

## 2011-11-04 ENCOUNTER — Encounter (HOSPITAL_BASED_OUTPATIENT_CLINIC_OR_DEPARTMENT_OTHER): Payer: Self-pay | Admitting: Podiatrist

## 2011-11-04 ENCOUNTER — Ambulatory Visit (HOSPITAL_BASED_OUTPATIENT_CLINIC_OR_DEPARTMENT_OTHER): Payer: Medicaid Other | Admitting: Podiatrist

## 2011-11-04 DIAGNOSIS — G9050 Complex regional pain syndrome I, unspecified: Secondary | ICD-10-CM

## 2011-11-04 DIAGNOSIS — M79673 Pain in unspecified foot: Secondary | ICD-10-CM

## 2011-11-04 MED ORDER — DEXAMETHASONE SODIUM PHOSPHATE 4 MG/ML IJ SOLN
2.00 mg | Freq: Once | INTRAMUSCULAR | Status: AC
Start: 2011-11-04 — End: 2011-11-04

## 2011-11-04 NOTE — Progress Notes (Signed)
Subjective:   41 y/o female with a history of RSD of the right foot presents to the clinic with chronic pain. Patient relates that she comes to the clinic 2 times per week, and receives a shot of 0.5% Marcaine with Epinephrine and Dexamethasone. Patient states that she feels relief after the shots for 2 days, and then the pain returns. Current pain level is a 6/10, and relates to a numbness and "pins and needles" feeling constantly. Patient states that on last visit, she was wearing a surgical boot, and that it decreased the blood flow to her foot, and that she was told to return to normal shoes. Patient states that she also has a rheumatology appointment next week. Patient is requesting another shot today. Denies any other pedal complaints at this time.     ROS: Right foot pain. Denies f/c/n/v/d. No other pertinent positives were noted.     Objective:   (R foot focused)  General: NAD and A&Ox3.   Derm: Skin is warm, dry, supple. No open lesions, HPKs, macerations or signs of infection. Nails are normal in color, thickness, and length.   Vascular: DP/PT pulses palpable 2/4. CFTs <4 seconds to the digits. No edema noted.   Neuro: Protective sensation is diminished via light touch to the plantar aspect of the foot, and is intact on the dorsum of the foot. (+) Tinels to the tibial and common peroneal nerve.   MSK: Pain on palpation of the plantar aspect of the 1-5 metatarsal heads, and at the plantar midfoot. Pain with dorsiflexion, plantarflexion, inversion, and most pain with eversion. Muscle strength deferred due to increased pain level.     Assessment:  -RSD   -Neuritis: Common peroneal and Tibial nerves, right.     Plan:  -Reviewed patients chart and performed focused history and physical  -Patient verbally consented to a shot for the tibial and common peroneal nerves, and patient understands risks and benefits. Prepped areas with an alcohol pad. 2 separate syringes were prepared, each with 5cc of 0.5%  Marcaine with epinephrine and 0.25cc of Dexamethasone Sodium Phosphate. Patient tolerated pain well. Applied a band-aid to the area.   -Discussed with patient about her receiving a longer acting Marcaine shot upon next appointment, as long as the mixture gets approved. Patient understands.   -RTC on Tuesday, November 09, 2011 for another injection.

## 2011-11-04 NOTE — Progress Notes (Signed)
Pt seen with [facility].  I concur with their assessment, and confirm that this treatment plan is correct.

## 2011-11-04 NOTE — Progress Notes (Signed)
PMH unchanged.    Common peroneal block and posterior tibial nerve block performed on right foot and leg for RSD, with 5cc bupivicaine with epinephrine, and 1/4 cc dexamethasone (4mg /ml).

## 2011-11-09 ENCOUNTER — Ambulatory Visit (HOSPITAL_BASED_OUTPATIENT_CLINIC_OR_DEPARTMENT_OTHER): Payer: Medicaid Other | Admitting: Podiatrist

## 2011-11-09 ENCOUNTER — Encounter (HOSPITAL_BASED_OUTPATIENT_CLINIC_OR_DEPARTMENT_OTHER): Payer: Self-pay | Admitting: Podiatrist

## 2011-11-09 DIAGNOSIS — G9050 Complex regional pain syndrome I, unspecified: Secondary | ICD-10-CM

## 2011-11-11 ENCOUNTER — Ambulatory Visit (HOSPITAL_BASED_OUTPATIENT_CLINIC_OR_DEPARTMENT_OTHER): Payer: PRIVATE HEALTH INSURANCE | Admitting: Internal Medicine

## 2011-11-11 VITALS — BP 117/83 | HR 79 | Ht 62.25 in | Wt 161.0 lb

## 2011-11-11 DIAGNOSIS — M25519 Pain in unspecified shoulder: Secondary | ICD-10-CM

## 2011-11-11 DIAGNOSIS — G9050 Complex regional pain syndrome I, unspecified: Secondary | ICD-10-CM

## 2011-11-11 DIAGNOSIS — I7300 Raynaud's syndrome without gangrene: Secondary | ICD-10-CM

## 2011-11-11 NOTE — Progress Notes (Signed)
Date of Service: 11/11/2011    HISTORY OF PRESENT ILLNESS:  I had the pleasure of meeting this patient in consultation today regarding Raynaud's.     She is a 41 year old Caucasian English-speaking woman who developed white discoloration in two of her fingers on the right hand in 2007.  She works as a Product manager, and while she was at work 2 of her fingers turned white.  She tried rewarming them and they would not return to normal color.  The fingers were tingling and slightly painful.  Her supervisor sent her to the Emergency Department.  She was evaluated by a rheumatologist, who recommended a calcium channel blocker.  She started amlodipine, and she has been taking it for the past 6 years.  She has not had recurrence in color change or pain in the hands.  She denies fevers, unintentional weight loss, headaches, blurry vision, double vision, dry eye, dry mouth, aphthous ulcers, nasal ulcers, vaginal ulcers, chronic chest pain, dysphagia, chronic abdominal pain, photosensitivity, nail changes, significant alopecia, myalgias, joint swelling, and joint pain except for pain in her foot.  She has never had a miscarriage.  Denies a family history of lupus.  When she is anxious she develops hives that resolve as soon as she is able to calm herself down.      Approximately 2 years ago, a large file cabinet fell on her right foot.  Three days later, the foot swelled up.  She developed pain, numbness, and tingling.  She developed intermittent significant swelling, sometimes associated with blue toes.  The bluetoes occured more commonly when she was wearing a boot for her foot pain.  The boot helped her pain, but worsened the cyanosis.  She reports Dr. Wellington Hampshire checking for Doppler's in the right foot and her being pulseless.  Since not using the booy, the blue toes only occur rarely.  Her toes always feel cold, but they have not been changing white or blue recently.  She is asymptomatic in the other foot.    She  denies cocaine, injection drug, and tobacco use.  She has not had viral hepatitis as far she knows.  She was immunized for hepatitis B virus.   She denies epistaxis.  She has asthma that she has had for a long period of time, but no other pulmonary issues.  She denies renal disease.    Her pain is manageable with Tapentadol, lidocaine patches, and a nerve block 2-3 times a week.  She takes diazepam and bupropion for anxiety.  She was on venlafaxine and quetiapine, but she is not on either any longer.  Pregabalin and gabapentin did not give her any relief.    PAST MEDICAL HISTORY:  1.  Plantar fasciitis.  2.  RSD of the right foot.   3.  HSV-1.  4.  Hypothyroidism.  5.  Hypertension.  6.  Right shoulder labral tear that required surgery.  She used to be a Naval architect in baseball and softball.  Rotator cuff tear that was diagnosed about 5 years ago.  She did not have surgery.  She continues to have some pain in the right shoulder, although she does not have adhesive capsulitis.  She is able to reach overhead.  7.  Asthma.  8.  First MTP degenerative changes.  9.  Insomnia.  10.  Elevated blood pressure  11.  Anxiety.  12.  Left sided ACL tear requiring surgery with a cadaver ACL.  13.  Hysterectomy.    14.  Nephrolithiasis during  pregnancy.    ALLERGIES AND ADVERSE REACTIONS:    1.  Oxycodone caused hives.    2.  Penicillin caused hives.   3.  Erythromycin caused nausea  4.  Fluoxetine caused rash.    CURRENT MEDICATIONS:  Multivitamin, Synthroid 125 mcg, Tapentadol, albuterol, amlodipine, bupropion, diazepam, fluticasone, salmeterol, hydroxyzine, hydrochlorothiazide, lidocaine, oxycodone, and zolpidem.    FAMILY HISTORY:  Father with gout, grandmother and brother with thyroid disease.    GYNECOLOGICAL HISTORY:  Menarche at age 75.  Regular menses.  She had a hysterectomy in 2005.    SOCIAL HISTORY:  No tobacco or alcohol.  Highest level in school was college.  She feels safe at home.  She performs her ADLs.  She is  right-handed.  She works in Airline pilot.     REVIEW OF SYSTEMS:  She describes fatigue, pain all over, poor sleep, feeling of hot and cold, paresthesias, and pain all related to her foot.  She also describes difficulty with concentration, dyspnea when she has asthma flare.    PHYSICAL EXAMINATION:  VITAL SIGNS:  Weight 161 pounds, height 5 feet 2.25 inches, blood pressure 117/83, pulse is 79.  GENERAL:  She is in no acute distress.  She is alert and oriented.  There is no alopecia, no malar rash.  HEENT:  Anicteric sclerae.  Noninjected conjunctivae.  No nystagmus.  EOMI.  Oropharynx clear, moist mucous membrane.  Tongue is midline.  Symmetric palate raise.  No exudate or ulceration.  NECK:  Supple, no lymphadenopathy.  PULMONARY:  Clear to auscultation without wheezes, rhonchi, or rales.  CARDIOVASCULAR:  Nail fold capillary beds showed a small amount of dilatation in a few vessels, but besides that, everything appeared normal.  No drop out or tortuous vessels, and no hemorrhage.  ABDOMEN:  Soft, nontender.  EXTREMITIES:  Warm.  Digits are well perfused.  There is no coolness to the feet today.  There is no edema.  MUSCULOSKELETAL:  There is no synovitis in the hands or wrists.  She can make a fist without difficulty.  Full range of motion in the wrists, elbows, and shoulders.  No pain on palpation of the shoulders.  No pain on palpation of the chest or back.  Full range of motion in the hips and knees.  She has decreased active dorsiflexion of the right ankle.  There are some bony deformities on the dorsum of the foot.  There is no swelling in the right foot today as compared to the left.  She has some tenderness to palpation diffusely.  Pulses: She has good dorsalis pedis pulses bilaterally, and they are symmetric.    LABORATORY DATA:  WBC 9.7; hematocrit 46; platelets 236,000, creatinine 0.7, normal LFTs  In 2012:  TSH 0.46  January 2011:  HIV negative    ASSESSMENT:  A 41 year old English-speaking, Caucasian woman  who developed Raynaud's around the age of 56 in 2 fingers.  Gaynelle Adu has been on a calcium channel blocker, since that time and she has not had a recurrence of symptoms.  After having an injury to her foot and developing  RSD-like symptoms, she has been developing cyanosis in the toes, more so when her foot was in a fixed position in a boot. and improved without the boot.  She denies symptoms to suggest a connective tissue disorder such as lupus.  Her review of systems does not suggest a vasculitis.      PLAN:    1.  Given that there is room to move on  her blood pressure, I think it is reasonable to increase her amlodipine to 10 mg and see if she has an improvement in the cold sensation in her foot.  If she has an adverse reaction, she will let me know.  I will see her back in about 2-3 weeks.  If she is unable to tolerate high dose, she will go back to 5 mg and we may talk about the potential for other agents.  If she is able to tolerate it and she feels better, then she will stay on the 10 mg.      2.  I would not recommend a lab evaluation for a connective tissue disorder such as lupus at this time.      I appreciate the opportunity to participate in this patient's care.    ___________________________  Reviewed and Electronically Signed By: Charlane Ferretti MD  Sig Date: 11/22/2011  Sig Time: 13:29:02  Dictated By: Charlane Ferretti MD  Dict Date: 11/11/2011 Dict Time: 02 17 PM    Dictation Date and Time:11/11/2011 14:17:56  Transcription Date and Time:11/11/2011 15:25:24  eScription Dictation id: [id number] Confirmation # :[id number]      cc: ADAM LANDSMAN DPM; Mitchell Heir MD; Shelda Pal DPM; Moises Blood MD    DICTATED BY: Charlane Ferretti MDSUZANNE L Charniece Venturino MDD:11/11/2011 14:17:56 T:11/11/2011 15:25:24 2N Job#: [id number]

## 2011-11-11 NOTE — Progress Notes (Signed)
The primary progress note for this visit has been dictated through E-Scription. It can be viewed as an attachment to this encounter or through Chart Review under the Other Tab as an Rheumatology Office Note.      Review of Systems: Constitutional, Eyes, ENT/Mouth, Cardiovascular, Respiratory, GI, GU, Neuro, Psych, Heme/Lymph, Skin, Musculoskeletal, and Endocrine systems were reviewed and are NEGATIVE, except for what is dictated in the note.

## 2011-11-12 ENCOUNTER — Encounter (HOSPITAL_BASED_OUTPATIENT_CLINIC_OR_DEPARTMENT_OTHER): Payer: Self-pay | Admitting: Podiatrist

## 2011-11-12 ENCOUNTER — Ambulatory Visit (HOSPITAL_BASED_OUTPATIENT_CLINIC_OR_DEPARTMENT_OTHER): Payer: Medicaid Other | Admitting: Podiatrist

## 2011-11-12 DIAGNOSIS — G9050 Complex regional pain syndrome I, unspecified: Secondary | ICD-10-CM

## 2011-11-13 MED ORDER — DEXAMETHASONE SODIUM PHOSPHATE 4 MG/ML IJ SOLN
2.00 mg | Freq: Once | INTRAMUSCULAR | Status: AC
Start: 2011-11-13 — End: 2011-11-13

## 2011-11-13 NOTE — Progress Notes (Signed)
Subjective:   41 y/o female with a history of RSD of the right foot presents to the clinic with chronic pain. Patient relates that she comes to the clinic 2 times per week, and receives a shot of 0.5% Marcaine with Epinephrine and Dexamethasone. Patient states that she feels relief after the shots for 2 days, and then the pain returns. Current pain level is a 6/10, and relates to a numbness and "pins and needles" feeling constantly. Patient states that on last visit, she was wearing a surgical boot, and that it decreased the blood flow to her foot, and that she was told to return to normal shoes. Patient states that she also has a rheumatology appointment next week. Patient is requesting another shot today. Denies any other pedal complaints at this time.   ROS: Right foot pain. Denies f/c/n/v/d. No other pertinent positives were noted.   Objective:   (R foot focused)   General: NAD and A&Ox3.   Derm: Skin is warm, dry, supple. No open lesions, HPKs, macerations or signs of infection. Nails are normal in color, thickness, and length.   Vascular: DP/PT pulses palpable 2/4. CFTs <4 seconds to the digits. No edema noted.   Neuro: Protective sensation is diminished via light touch to the plantar aspect of the foot, and is intact on the dorsum of the foot. (+) Tinels to the tibial and common peroneal nerve.   MSK: Pain on palpation of the plantar aspect of the 1-5 metatarsal heads, and at the plantar midfoot. Pain with dorsiflexion, plantarflexion, inversion, and most pain with eversion. Muscle strength deferred due to increased pain level.   Assessment:   -RSD   -Neuritis: Common peroneal and Tibial nerves, right.   Plan:  -Reviewed patients chart and performed focused history and physical   -Patient verbally consented to a shot for the tibial and common peroneal nerves, and patient understands risks and benefits. Prepped areas with an alcohol pad. 2 separate syringes were prepared, each with 5cc of 0.5%  Marcaine with epinephrine and 0.25cc of Dexamethasone Sodium Phosphate. Patient tolerated pain well. Applied a band-aid to the area.   -Discussed with patient about her receiving a longer acting Marcaine shot upon next appointment, as long as the mixture gets approved. Patient understands.   -RTC on for another injection.

## 2011-11-14 MED ORDER — DEXAMETHASONE SODIUM PHOSPHATE 4 MG/ML IJ SOLN
2.00 mg | Freq: Once | INTRAMUSCULAR | Status: AC
Start: 2011-11-14 — End: 2011-11-14

## 2011-11-14 NOTE — Progress Notes (Signed)
Subjective:   41 y/o female with a history of RSD of the right foot presents to the clinic with chronic pain. Patient relates that she comes to the clinic 2 times per week, and receives a shot of 0.5% Marcaine with Epinephrine and Dexamethasone. Patient states that she feels relief after the shots for 2 days, and then the pain returns. Current pain level is a 6/10, and relates to a numbness and "pins and needles" feeling constantly. Patient states that on last visit, she was wearing a surgical boot, and that it decreased the blood flow to her foot, and that she was told to return to normal shoes. Patient states that she also has a rheumatology appointment next week. Patient is requesting another shot today. Denies any other pedal complaints at this time.   ROS: Right foot pain. Denies f/c/n/v/d. No other pertinent positives were noted.   Objective:   (R foot focused)   General: NAD and A&Ox3.   Derm: Skin is warm, dry, supple. No open lesions, HPKs, macerations or signs of infection. Nails are normal in color, thickness, and length.   Vascular: DP/PT pulses palpable 2/4. CFTs <4 seconds to the digits. No edema noted.   Neuro: Protective sensation is diminished via light touch to the plantar aspect of the foot, and is intact on the dorsum of the foot. (+) Tinels to the tibial and common peroneal nerve.   MSK: Pain on palpation of the plantar aspect of the 1-5 metatarsal heads, and at the plantar midfoot. Pain with dorsiflexion, plantarflexion, inversion, and most pain with eversion. Muscle strength deferred due to increased pain level.   Assessment:   -RSD   -Neuritis: Common peroneal and Tibial nerves, right.   Plan:  -Reviewed patients chart and performed focused history and physical   -Patient verbally consented to a shot for the tibial and common peroneal nerves, and patient understands risks and benefits. Prepped areas with an alcohol pad. 2 separate syringes were prepared, each with 5cc of 0.5%  Marcaine with epinephrine and 0.25cc of Dexamethasone Sodium Phosphate. Patient tolerated pain well. Applied a band-aid to the area.   -Discussed with patient about her receiving a longer acting Marcaine shot upon next appointment, as long as the mixture gets approved. Patient understands.   -RTC for another injection prn.

## 2011-11-16 ENCOUNTER — Ambulatory Visit (HOSPITAL_BASED_OUTPATIENT_CLINIC_OR_DEPARTMENT_OTHER): Payer: Medicaid Other | Admitting: Podiatrist

## 2011-11-16 ENCOUNTER — Encounter (HOSPITAL_BASED_OUTPATIENT_CLINIC_OR_DEPARTMENT_OTHER): Payer: Self-pay | Admitting: Podiatrist

## 2011-11-16 DIAGNOSIS — M79673 Pain in unspecified foot: Secondary | ICD-10-CM

## 2011-11-16 DIAGNOSIS — G9050 Complex regional pain syndrome I, unspecified: Secondary | ICD-10-CM

## 2011-11-16 MED ORDER — DEXAMETHASONE SODIUM PHOSPHATE 4 MG/ML IJ SOLN
2.00 mg | Freq: Once | INTRAMUSCULAR | Status: AC
Start: 2011-11-16 — End: 2011-11-16

## 2011-11-16 NOTE — Progress Notes (Signed)
This office note has been dictated. Account number [id number]

## 2011-11-16 NOTE — Progress Notes (Signed)
Date of Service: 11/16/2011    SUBJECTIVE:  The patient presents to the office today for followup for reflex sympathetic dystrophy.  Today, we will administer posterior tibial and common peroneal nerve block to the right foot with 0.25 mL of dexamethasone added.  She reports that since her last visit, she had relief of pain for approximately 48 hours and then the pain has gotten much worse preventing her from sleeping normally.  She now describes a new sensation, which is a tingling beginning at the ball of the foot and radiating proximally, sometimes as far as the ankle.  She describes it as a pins and needles sensation, which is much more profound than previously experienced.  She did note that this began approximately at the same time that her calcium channel blocker dosage was adjusted.  This may or may not be contributory to her current symptoms.    ASSESSMENT AND PLAN:  Today, we administered the injection and I have asked her to not make any additional changes to her medications until we see her at her next visit to determine if this new symptom is persistent or fleeting.  Plan is for her to follow up with [place] in approximately 4 days.    ___________________________  Reviewed and Electronically Signed By: Naoma Diener DPM  Sig Date: 11/25/2011  Sig Time: 23:04:47  Dictated By: Naoma Diener DPM  Dict Date: 11/16/2011 Dict Time: 01 03 PM    Dictation Date and Time:11/16/2011 13:03:39  Transcription Date and Time:11/16/2011 13:59:46  eScription Dictation id: [id number] Confirmation # :[id number]      DICTATED BY: Cameran Ahmed S [facility] DPMADAM S [employer] DPMD:11/16/2011 13:03:39 T:11/16/2011 13:59:46 3N Job#: [id number]

## 2011-11-18 ENCOUNTER — Ambulatory Visit (HOSPITAL_BASED_OUTPATIENT_CLINIC_OR_DEPARTMENT_OTHER): Payer: Medicaid Other | Admitting: Podiatrist

## 2011-11-19 ENCOUNTER — Ambulatory Visit (HOSPITAL_BASED_OUTPATIENT_CLINIC_OR_DEPARTMENT_OTHER): Payer: Medicaid Other | Admitting: Podiatrist

## 2011-11-19 DIAGNOSIS — M792 Neuralgia and neuritis, unspecified: Secondary | ICD-10-CM

## 2011-11-19 DIAGNOSIS — G9050 Complex regional pain syndrome I, unspecified: Secondary | ICD-10-CM

## 2011-11-19 DIAGNOSIS — M79673 Pain in unspecified foot: Secondary | ICD-10-CM

## 2011-11-19 MED ORDER — ZOLPIDEM TARTRATE ER 6.25 MG PO TBCR
6.2500 mg | EXTENDED_RELEASE_TABLET | Freq: Every evening | ORAL | Status: DC | PRN
Start: 2011-11-19 — End: 2011-12-30

## 2011-11-19 MED ORDER — DEXAMETHASONE SODIUM PHOSPHATE 4 MG/ML IJ SOLN
2.00 mg | Freq: Once | INTRAMUSCULAR | Status: AC
Start: 2011-11-19 — End: 2011-11-19

## 2011-11-19 MED ORDER — TAPENTADOL HCL ER 150 MG PO TB12
1.0000 | ORAL_TABLET | Freq: Four times a day (QID) | ORAL | Status: DC
Start: 2011-11-19 — End: 2011-12-10

## 2011-11-19 MED ORDER — ZOLPIDEM TARTRATE 5 MG PO TABS
5.0000 mg | ORAL_TABLET | Freq: Every evening | ORAL | Status: DC | PRN
Start: 2011-11-19 — End: 2011-12-30

## 2011-11-19 NOTE — Progress Notes (Signed)
Pt presents with continued pain.  Today, she is slightly diaphoretic, and notes that her pain has been worse lately.  She correlates this with change in her Ca Channel blocker dosage.  I have asked that she resume her prior dosage, and see if that resolves the issue.      Today, I administered nerve blocks to the PT and common peroneal nerves, Right leg and foot.  I have also given her new Rx for Nucynta and for ambien.  I strapped her right foot with a silipos device, and she reports that this light compression is helpful.  I also dispensed compression stockings for her to help stimulate circulation in the legs.      Plan is to f/u with her next week.  PMH otherwise unchanged.      Physical Exam:  Pt has palpable dorsalis pedis and posterior tibial pulses.  Pt has 5/5 muscle strength bilaterally, for lower extremity based on manual muscle testing of DF/PF, Inversion/Eversion, External/Internal rotation, and contraction/extension of feet.  Pt is (-) for calf pain with direct compression.  Pt denies f/c/n/v/sob. Pt is awake, alert and oriented, and is able to communicate their concerns and appears to understand my responses.    Pt does not report any acute trauma.    20 minutes today.

## 2011-11-23 ENCOUNTER — Ambulatory Visit (HOSPITAL_BASED_OUTPATIENT_CLINIC_OR_DEPARTMENT_OTHER): Payer: Medicaid Other | Admitting: Podiatrist

## 2011-11-23 DIAGNOSIS — G9050 Complex regional pain syndrome I, unspecified: Secondary | ICD-10-CM

## 2011-11-23 DIAGNOSIS — M79673 Pain in unspecified foot: Secondary | ICD-10-CM

## 2011-11-23 NOTE — Progress Notes (Signed)
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  Since adjusting her calcium channel blocker dosage downward, her symptoms have decreased significantly.  Today, we have performed common peroneal and posterior tibial nerve blocks utilizing 5 mL of bupivacaine with epinephrine and 0.25 mL of dexamethasone 4 mg/mL in each site.  She tolerated the injections well without any complications noted.  Plan is for her to follow up with [place] later this week.    ___________________________  Reviewed and Electronically Signed By: Naoma Diener DPM  Sig Date: 11/25/2011  Sig Time: 23:05:48  Dictated By: Naoma Diener DPM  Dict Date: 11/23/2011 Dict Time: 01 08 PM    Dictation Date and Time:11/23/2011 13:08:49  Transcription Date and Time:11/23/2011 13:35:01  eScription Dictation id: [id number] Confirmation # :[id number]      DICTATED BY: Sharicka Pogorzelski S [facility] DPMADAM S [facility] DPMD:11/23/2011 13:08:49 T:11/23/2011 13:35:01 3N Job#: [id number]

## 2011-11-24 MED ORDER — DEXAMETHASONE SODIUM PHOSPHATE 4 MG/ML IJ SOLN
2.00 mg | Freq: Once | INTRAMUSCULAR | Status: AC
Start: 2011-11-24 — End: 2011-11-24

## 2011-11-24 NOTE — Progress Notes (Signed)
This office note has been dictated. Account number [id number]

## 2011-11-25 ENCOUNTER — Encounter (HOSPITAL_BASED_OUTPATIENT_CLINIC_OR_DEPARTMENT_OTHER): Payer: Self-pay | Admitting: Podiatrist

## 2011-11-25 ENCOUNTER — Other Ambulatory Visit (HOSPITAL_BASED_OUTPATIENT_CLINIC_OR_DEPARTMENT_OTHER): Payer: Self-pay | Admitting: Podiatrist

## 2011-11-25 ENCOUNTER — Encounter (HOSPITAL_BASED_OUTPATIENT_CLINIC_OR_DEPARTMENT_OTHER): Payer: Self-pay | Admitting: Internal Medicine

## 2011-11-25 ENCOUNTER — Other Ambulatory Visit (HOSPITAL_BASED_OUTPATIENT_CLINIC_OR_DEPARTMENT_OTHER): Payer: Self-pay | Admitting: Internal Medicine

## 2011-11-25 ENCOUNTER — Ambulatory Visit (HOSPITAL_BASED_OUTPATIENT_CLINIC_OR_DEPARTMENT_OTHER): Payer: Medicaid Other | Admitting: Podiatrist

## 2011-11-25 DIAGNOSIS — G9050 Complex regional pain syndrome I, unspecified: Secondary | ICD-10-CM

## 2011-11-25 DIAGNOSIS — E039 Hypothyroidism, unspecified: Secondary | ICD-10-CM

## 2011-11-25 MED ORDER — SYNTHROID 125 MCG PO TABS
125.0000 ug | ORAL_TABLET | Freq: Every morning | ORAL | Status: DC
Start: 2011-11-25 — End: 2011-12-21

## 2011-11-25 MED ORDER — DEXAMETHASONE SODIUM PHOSPHATE 4 MG/ML IJ SOLN
2.00 mg | Freq: Once | INTRAMUSCULAR | Status: AC
Start: 2011-11-25 — End: 2011-11-25

## 2011-11-25 NOTE — Progress Notes (Signed)
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 Last clinical visit was 11/23/11 at which point both a peroneal + posterior tibial nerve block was performed. Unfortunately, pain is worsened today because she was in attendance at her daughter's 8th grade graduation and was wearing heels. Patient took 10-mg oxycodone ~ 45 min ago which helped her attain some relief. Complaining of some shooting pains from the foot into the upper leg. No fevers, chills, SOB, CP, malaise, nausea, vomiting, diarrhea.    MEDICATIONS:    Current Outpatient Prescriptions:  Tapentadol HCl (NUCYNTA) 150 MG TB12 Take 1 tablet by mouth 4 (four) times daily. Disp: 84 tablet Rfl: 0   zolpidem (AMBIEN CR) 6.25 MG CR tablet Take 1 tablet by mouth nightly as needed for Sleep. Disp: 30 tablet Rfl: 0   zolpidem (AMBIEN) 5 MG tablet Take 1 tablet by mouth nightly as needed for Sleep. Disp: 30 tablet Rfl: 0   hydrOXYzine (ATARAX) 25 MG tablet Take 1 tablet by mouth every 8 (eight) hours as needed for Itching or Anxiety. Disp: 30 tablet Rfl: 3   oxycodone (OXYCONTIN) 10 MG 12 hr tablet Take 1 tablet by mouth daily. Disp: 30 tablet Rfl: 0   SYNTHROID 125 MCG tablet Take 1 tablet by mouth every morning. Disp: 30 tablet Rfl: 3   EPINEPHrine (EPIPEN 2-PAK) 0.3 MG/0.3ML Injection Device Inject 0.3 mg into the muscle once as needed. Disp: 1 each Rfl: 1   lidocaine (LIDODERM) 5 % patch Place 1 patch onto the skin daily. Disp: 30 patch Rfl: 4   fluticasone-salmeterol (ADVAIR DISKUS) 500-50 MCG/DOSE AEPB Aerosol Powder Inhale 1 puff into the lungs every 12 (twelve) hours. Disp: 180 each Rfl: 4   buPROPion (WELLBUTRIN XL) 300 MG 24 hr tablet Take 1 tablet by mouth daily. Disp: 90 tablet Rfl: 4   albuterol (PROAIR HFA) 108 (90 BASE) MCG/ACT inhaler Inhale 2 puffs into the lungs every 6 (six) hours as needed for Wheezing. Disp: 1 Inhaler Rfl: 3   hydrochlorothiazide  (HYDRODIURIL) 25 MG tablet Take 1 tablet by mouth daily. Disp: 90 tablet Rfl: 4   amlodipine (NORVASC) 5 MG tablet Take 1 tablet by mouth daily. Disp: 90 tablet Rfl: 4   Multiple Vitamin (MULTIVITAMIN) tablet Take 1 tablet by mouth daily. Disp: 30 tablet Rfl: 12   EXPIRED: dexamethasone (DECADRON) 4 MG/ML injection Inject 0.5 mLs into the muscle once. Disp: 1 each Rfl: 0   venlafaxine (EFFEXOR-XR) 75 MG 24 hr capsule Take 1 capsule by mouth 2 (two) times daily. Disp: 60 capsule Rfl: 3   EXPIRED: gabapentin (NEURONTIN) 300 MG capsule Take 1 capsule by mouth 3 (three) times daily. Disp: 90 capsule Rfl: 2   EXPIRED: QUEtiapine (SEROQUEL) 25 MG tablet Take  by mouth. Take 2 tablets by mouth qhs. Then take 1 tablet bid prn anxiety. Disp: 90 tablet Rfl: 0   EXPIRED: furosemide (LASIX) 20 MG tablet Take 1 tablet by mouth 2 (two) times daily. Disp: 60 tablet Rfl: 1   EXPIRED: clindamycin-benzoyl peroxide (BENZACLIN) gel Apply  topically 2 (two) times daily. Disp: 50 g Rfl: 4     No current facility-administered medications for this visit.    ALLERGIES:  Review of Patient's Allergies indicates:   Oxycodone                   Comment:Hives, other narcotics OK   Penicillins  Hives   Erythromycin            Nausea Only    Comment:Stomach pain severe no nausea, can take             azithromycin   Fluoxetine              Rash    PHYSICAL EXAM:    GEN: aaox3. nad.  Extremities: RIGHT pedal pulses are palpable; capillary refill time < 3 sec x 5 toes; Temperature gradient is increased. Increased sensitivity to light touch.    ASSESSMENT/PLAN: 41 year old female with RIGHT chronic regional pain syndrome. Peroneal + posterior tibial injections performed today using 5-cc of 0.5% bupivicaine l+ epi + 1/4-cc of dexamethasone 4mg /cc. Will follow up next week.    Electronically signed by: Renette Butters, 11/25/2011 9:46 AM  This note is electronically signed in the electronic medical record.

## 2011-11-25 NOTE — Telephone Encounter (Signed)
Message copied by Stanford Scotland on Thu Nov 25, 2011 11:24 AM  ------       Message from: Larena Glassman       Created: Thu Nov 25, 2011 11:11 AM       Regarding: Refill       Contact: [phone]         [facility]              Person calling on behalf of patient: Pharmacy              May list multiple medications in this section              Medicine Name: Levothyroxine 125 mcg tablet                     Documented patient preferred pharmacies:        [facility] - [address] - [place], [place] - [address]       Phone: [phone] Fax: [phone]              [name] NUMBER: [phone]              Patient's language of care: English                                            ------

## 2011-11-25 NOTE — Progress Notes (Signed)
Patient was seen with resident Dr. Nodelman.  I concur with their assessment and treatment plan.

## 2011-11-25 NOTE — Progress Notes (Signed)
PER Pharmacy,     Madison Mckay, a 41 year old female has requested a refill of Levothyroxine .    Last Office Visit: 04/26/2011  Last Physical Exam: 07/06/2010    Thyroid Med:  TSH:  THYROID STIM HORMONE (uIU/mL)   Date  Value    07/06/2010  0.46    ---------- TSH Screen:  No results found for this basename: TSHSC      Documented patient preferred pharmacies:    [facility] - [address] - [place], [place] - [address]  Phone: [phone] Fax: [phone]

## 2011-11-29 ENCOUNTER — Encounter (HOSPITAL_BASED_OUTPATIENT_CLINIC_OR_DEPARTMENT_OTHER): Payer: Self-pay | Admitting: Foot & Ankle Surgery

## 2011-11-29 ENCOUNTER — Ambulatory Visit (HOSPITAL_BASED_OUTPATIENT_CLINIC_OR_DEPARTMENT_OTHER): Payer: Medicaid Other | Admitting: Foot & Ankle Surgery

## 2011-11-29 ENCOUNTER — Telehealth (HOSPITAL_BASED_OUTPATIENT_CLINIC_OR_DEPARTMENT_OTHER): Payer: Self-pay | Admitting: Internal Medicine

## 2011-11-29 DIAGNOSIS — IMO0002 Reserved for concepts with insufficient information to code with codable children: Secondary | ICD-10-CM

## 2011-11-29 MED ORDER — DEXAMETHASONE SODIUM PHOSPHATE 4 MG/ML IJ SOLN
2.00 mg | Freq: Once | INTRAMUSCULAR | Status: AC
Start: 2011-11-29 — End: 2011-11-29

## 2011-11-29 NOTE — Progress Notes (Signed)
41 year old female patient of Dr. Christeen Douglas with CRPS, right foot. Patient is following up for bi-weekly nerve blocks. She states she is doing well. Pain is well controlled. She continues to ambulate without difficulty in regular shoes.     ROS: Unremarkable. Pain right leg.    PMH, Meds, Allergies reviewed.     Physical Exam:  - Gen: A&Ox3, NAD  - Right LE: DP/PT pulses palpable. Gross sensation is intact. Skin coloration is wnl. No pain with light touch or ROM. No edema. No mottling of skin. No temperature changes vs. Contralateral limb.    A/P: CRPS, right foot  - Patient's name and date of birth confirmed. A posterior tibial and common peroneal nerve block were administered, each block consisting of 4cc 0.5% Marcaine plain, 1cc of 1% of Lidocaine with epinephrine and 0.25cc Dexamethasone 4mg /ml  into each area.  Patient tolerated procedure well with no complications. She will follow up at the end of the week for another block.

## 2011-11-29 NOTE — Progress Notes (Signed)
Prior authorization required for medication: SYNTHROID 125 MCG tablet  Membership ID (insurance): [id number]  if Medicare insurance , Prescription drug plan: N/A

## 2011-11-30 ENCOUNTER — Ambulatory Visit (HOSPITAL_BASED_OUTPATIENT_CLINIC_OR_DEPARTMENT_OTHER): Payer: PRIVATE HEALTH INSURANCE | Admitting: Internal Medicine

## 2011-11-30 VITALS — BP 123/88 | HR 89 | Ht 62.5 in | Wt 160.0 lb

## 2011-11-30 DIAGNOSIS — Z5181 Encounter for therapeutic drug level monitoring: Secondary | ICD-10-CM

## 2011-11-30 DIAGNOSIS — I7300 Raynaud's syndrome without gangrene: Secondary | ICD-10-CM

## 2011-11-30 MED ORDER — DOXAZOSIN MESYLATE 1 MG PO TABS
1.0000 mg | ORAL_TABLET | Freq: Every evening | ORAL | Status: DC
Start: 2011-11-30 — End: 2011-12-30

## 2011-11-30 NOTE — Progress Notes (Signed)
The primary progress note for this visit has been dictated through E-Scription. It can be viewed as an attachment to this encounter or through Chart Review under the Other Tab as an Rheumatology Office Note.      Review of Systems: Constitutional, Eyes, ENT/Mouth, Cardiovascular, Respiratory, GI, GU, Neuro, Psych, Heme/Lymph, Skin, Musculoskeletal, and Endocrine systems were reviewed and are NEGATIVE, except for what is dictated in the note.

## 2011-11-30 NOTE — Progress Notes (Signed)
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  She was placed on a calcium channel blocker at that time.  She has RSD symptoms in her right foot that requires twice a week nerve blocks.  Intermittently, she will have a blue foot, especially when the nerve block is wearing off.  When the foot is blue, she has more pain and disability.  She increased her amlodipine to 10 mg from 5 mg, and after her injections, she had worsening pain.  She tried this for 2 weeks and then to dropped the dose back down to 5 mg.  She is no longer wearing the boot, witch exacerbated the blue foot.  After she has her injections, she feels well for a few days, without a bluefoot or pain.  In fact, she was able to walk several miles today.  When she has the pain, she is unable to do many of her daily activities.  The patient denies any new symptoms including fevers, alopecia, chest pain, dyspnea, cough, joint pain, joint swelling, rash, aphthous ulcers.    PAST MEDICAL HISTORY:  1.  Plantar fasciitis  2.  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  She takes tapentadol and lidocaine patches.  3.  HSV  4.  Hypothyroidism  5.  Hypertension  6.  Right shoulder labral tear.  7.  Asthma  8.  First MTP degenerative changes  9.  Insomnia  10.  Elevated blood pressure on hydrochlorothiazide  11.  Anxiety  12.  Left ACL tear status post repair with a cadaver ACL  13.  Hysterectomy  14.  Nephrolithiasis during pregnancy    ALLERGIES:  Oxycodone, penicillin, erythromycin, fluoxetine.    CURRENT MEDICATIONS:  Multivitamin, Synthroid, tapentadol, albuterol, amlodipine, bupropion, fluticasone, salmeterol, hydroxyzine, hydrochlorothiazide, lidocaine patch, zolpidem.    PHYSICAL EXAMINATION:  VITAL SIGNS:  Height 5 feet 2-1/2 inches;  weight is 160 pounds; blood pressure 123/88; pulse is 89.  BMI 28.8.  GENERAL:  She is in no acute distress.  She is alert and oriented.  No malar rash.  HEENT:  No alopecia.  Oropharynx clear.  NECK:  Supple.  LUNGS:  Clear.  EXTREMITIES:  No edema.  SKIN:  No rashes.  MUSCULOSKELETAL:  No synovitis, no tender joints, no sclerodactyly, no digital ulcerations, no joint swelling.    LABORATORY DATA:  1.  WBC 9.7; hematocrit 46; platelets 236,000  2.  Creatinine 0.7, GFR greater than 60, normal LFTs, albumin 4.5  3.  TSH 0.46.    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    PLAN:  She will stay on 5 mg of amlodipine.  She will try 1 mg of doxazosin.  Risks including but not limited to dizziness, lightheadedness, and allergic reaction were discussed.  If she has worsening pain with the injection, she will let me know.  Otherwise, I will see her back to reevaluate her in about 3-4 weeks.    ___________________________  Reviewed and Electronically Signed By: Charlane Ferretti MD  Sig Date: 12/15/2011  Sig Time: 10:31:04  Dictated By: Charlane Ferretti MD  Dict Date: 11/30/2011 Dict Time: 11 38  AM    Dictation Date and Time:11/30/2011 11:38:39  Transcription Date and Time:11/30/2011 13:26:34  eScription Dictation id: [id number] Confirmation # :[id number]      cc: AD[facility] DPM; Mitchell Heir MD; Shelda Pal DPM    DICTATED BYCharlane Ferretti MDSUZANNE L Jocelin Schuelke MDD:11/30/2011 11:38:39 T:11/30/2011 13:26:34 1N Job#: [id number]

## 2011-12-03 ENCOUNTER — Ambulatory Visit (HOSPITAL_BASED_OUTPATIENT_CLINIC_OR_DEPARTMENT_OTHER): Payer: Medicaid Other | Admitting: Podiatrist

## 2011-12-03 DIAGNOSIS — G9050 Complex regional pain syndrome I, unspecified: Secondary | ICD-10-CM

## 2011-12-03 MED ORDER — DIAZEPAM 5 MG PO TABS
5.0000 mg | ORAL_TABLET | Freq: Two times a day (BID) | ORAL | Status: DC | PRN
Start: 2011-12-03 — End: 2011-12-10

## 2011-12-03 MED ORDER — DEXAMETHASONE SODIUM PHOSPHATE 4 MG/ML IJ SOLN
2.00 mg | Freq: Once | INTRAMUSCULAR | Status: AC
Start: 2011-12-03 — End: 2011-12-03

## 2011-12-03 NOTE — Progress Notes (Signed)
Pt reports some slight improvement, and she has been able to tolerate a little bit of exercise in the form of running, following injections.  Dr. Budd Palmer has also adjusted her medications.      Today, we have repeated her nerve blocks, with Bupivicaine with epi and dexamethasone, to both sites.  Plan to continue with bi-weekly injections.

## 2011-12-06 ENCOUNTER — Ambulatory Visit (HOSPITAL_BASED_OUTPATIENT_CLINIC_OR_DEPARTMENT_OTHER): Payer: Medicaid Other | Admitting: Podiatrist

## 2011-12-06 DIAGNOSIS — G9050 Complex regional pain syndrome I, unspecified: Secondary | ICD-10-CM

## 2011-12-06 NOTE — Progress Notes (Signed)
S:   42 yo female returns to clinic for  Bi-weekly right lower extremity peroneal and posterior tibial nerve blocks. Pt states she has had RSD for 2 years now following a work related injury. Pt states she is experiencing a new pain that she locates to her lateral knee. She states that the nerve blocks are helping the pain. Last clinic visit was 12/03/2011 where nerve blocks were administered to pt.     Current Outpatient Prescriptions:  diazepam (VALIUM) 5 MG tablet Take 1 tablet by mouth every 12 (twelve) hours as needed for Anxiety (muscle spasm). Disp: 60 tablet Rfl: 0   doxazosin (CARDURA) 1 MG tablet Take 1 tablet by mouth nightly. Disp: 30 tablet Rfl: 2   SYNTHROID 125 MCG tablet Take 1 tablet by mouth every morning. Disp: 90 tablet Rfl: 0   Tapentadol HCl (NUCYNTA) 150 MG TB12 Take 1 tablet by mouth 4 (four) times daily. Disp: 84 tablet Rfl: 0   zolpidem (AMBIEN CR) 6.25 MG CR tablet Take 1 tablet by mouth nightly as needed for Sleep. Disp: 30 tablet Rfl: 0   zolpidem (AMBIEN) 5 MG tablet Take 1 tablet by mouth nightly as needed for Sleep. Disp: 30 tablet Rfl: 0   hydrOXYzine (ATARAX) 25 MG tablet Take 1 tablet by mouth every 8 (eight) hours as needed for Itching or Anxiety. Disp: 30 tablet Rfl: 3   EPINEPHrine (EPIPEN 2-PAK) 0.3 MG/0.3ML Injection Device Inject 0.3 mg into the muscle once as needed. Disp: 1 each Rfl: 1   lidocaine (LIDODERM) 5 % patch Place 1 patch onto the skin daily. Disp: 30 patch Rfl: 4   fluticasone-salmeterol (ADVAIR DISKUS) 500-50 MCG/DOSE AEPB Aerosol Powder Inhale 1 puff into the lungs every 12 (twelve) hours. Disp: 180 each Rfl: 4   buPROPion (WELLBUTRIN XL) 300 MG 24 hr tablet Take 1 tablet by mouth daily. Disp: 90 tablet Rfl: 4   albuterol (PROAIR HFA) 108 (90 BASE) MCG/ACT inhaler Inhale 2 puffs into the lungs every 6 (six) hours as needed for Wheezing. Disp: 1 Inhaler Rfl: 3   hydrochlorothiazide (HYDRODIURIL) 25 MG tablet Take 1 tablet by mouth daily. Disp:  90 tablet Rfl: 4   amlodipine (NORVASC) 5 MG tablet Take 1 tablet by mouth daily. Disp: 90 tablet Rfl: 4   Multiple Vitamin (MULTIVITAMIN) tablet Take 1 tablet by mouth daily. Disp: 30 tablet Rfl: 12   venlafaxine (EFFEXOR-XR) 75 MG 24 hr capsule Take 1 capsule by mouth 2 (two) times daily. Disp: 60 capsule Rfl: 3   EXPIRED: gabapentin (NEURONTIN) 300 MG capsule Take 1 capsule by mouth 3 (three) times daily. Disp: 90 capsule Rfl: 2   EXPIRED: QUEtiapine (SEROQUEL) 25 MG tablet Take  by mouth. Take 2 tablets by mouth qhs. Then take 1 tablet bid prn anxiety. Disp: 90 tablet Rfl: 0   EXPIRED: furosemide (LASIX) 20 MG tablet Take 1 tablet by mouth 2 (two) times daily. Disp: 60 tablet Rfl: 1   EXPIRED: clindamycin-benzoyl peroxide (BENZACLIN) gel Apply  topically 2 (two) times daily. Disp: 50 g Rfl: 4     No current facility-administered medications for this visit.    ROS: Pt denies any fever, chills, nausea, vomiting, chills or SOB. See history of present illness for pertinent positive findings.     O:  Vascular DP PT are intact b/l, temperature gradient is WNL, CFT is <3secx10. Protective sensation are intact, there is hyperesthesia to the right foot.     A:  Right lower extremity CRPS  P:  Injection of 2 5CC syringes, each syringe consisting of: 4.75cc 1% Lido with epi, 0.25cc Dexamethasone 4mg /ml   Pt Return to Clinic at the end of the week for a block

## 2011-12-08 ENCOUNTER — Ambulatory Visit (HOSPITAL_BASED_OUTPATIENT_CLINIC_OR_DEPARTMENT_OTHER): Payer: Medicaid Other | Admitting: Podiatrist

## 2011-12-08 MED ORDER — DEXAMETHASONE SODIUM PHOSPHATE 4 MG/ML IJ SOLN
2.00 mg | Freq: Once | INTRAMUSCULAR | Status: AC
Start: 2011-12-08 — End: 2011-12-08

## 2011-12-08 NOTE — Progress Notes (Signed)
Pt seen with PMS4 Ahmadi.  I concur with their assessment, and confirm that this treatment plan is correct.

## 2011-12-10 ENCOUNTER — Other Ambulatory Visit (HOSPITAL_BASED_OUTPATIENT_CLINIC_OR_DEPARTMENT_OTHER): Payer: Self-pay | Admitting: Podiatrist

## 2011-12-10 ENCOUNTER — Ambulatory Visit (HOSPITAL_BASED_OUTPATIENT_CLINIC_OR_DEPARTMENT_OTHER): Payer: Medicaid Other | Admitting: Podiatrist

## 2011-12-10 DIAGNOSIS — G9050 Complex regional pain syndrome I, unspecified: Secondary | ICD-10-CM

## 2011-12-10 MED ORDER — DIAZEPAM 5 MG PO TABS
5.0000 mg | ORAL_TABLET | Freq: Two times a day (BID) | ORAL | Status: AC | PRN
Start: 2011-12-10 — End: 2012-01-10

## 2011-12-10 MED ORDER — OXYCODONE HCL ER 10 MG PO TB12
10.0000 mg | ORAL_TABLET | Freq: Every day | ORAL | Status: DC
Start: 2011-12-10 — End: 2012-05-24

## 2011-12-10 MED ORDER — TAPENTADOL HCL ER 150 MG PO TB12
1.0000 | ORAL_TABLET | Freq: Four times a day (QID) | ORAL | Status: AC
Start: 2011-12-10 — End: 2011-12-31

## 2011-12-10 MED ORDER — DEXAMETHASONE SODIUM PHOSPHATE 4 MG/ML IJ SOLN
2.00 mg | Freq: Once | INTRAMUSCULAR | Status: AC
Start: 2011-12-10 — End: 2011-12-10

## 2011-12-10 MED ORDER — LIDOCAINE 5 % EX PTCH
1.0000 | MEDICATED_PATCH | CUTANEOUS | Status: AC
Start: 2011-12-10 — End: 2012-01-09
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
## 2011-12-13 ENCOUNTER — Ambulatory Visit (HOSPITAL_BASED_OUTPATIENT_CLINIC_OR_DEPARTMENT_OTHER): Payer: Medicaid Other | Admitting: Podiatrist

## 2011-12-13 DIAGNOSIS — G90529 Complex regional pain syndrome I of unspecified lower limb: Secondary | ICD-10-CM

## 2011-12-13 MED ORDER — DEXAMETHASONE SODIUM PHOSPHATE 4 MG/ML IJ SOLN
2.00 mg | Freq: Once | INTRAMUSCULAR | Status: AC
Start: 2011-12-13 — End: 2011-12-13

## 2011-12-13 NOTE — Progress Notes (Signed)
CC:  41 y/o presents to clinic today with RSD that has been present for two years.  She is experiencing pain that radiates up her right leg.  It starts in her toes with tingling, pins and needles, and then it spreads through her entire foot and then shoots up her leg.  She rates her pain 8/10.  She is taking Oxycontin for pain and applies Lidoderm patches.  She uses a TENS unit every day for at least an hour.  She says lately she has been running and exercising which is making her pain worse.  Her previous injection was last Friday, December 10, 2011 and it lasted until Sunday.    PMH:  There have been no changes to her medications or PMH since 12/10/2011.    Review of Patient's Allergies indicates:   Oxycodone                   Comment:Hives, other narcotics OK   Penicillins             Hives   Erythromycin            Nausea Only    Comment:Stomach pain severe no nausea, can take             azithromycin   Fluoxetine              Rash       Current Outpatient Prescriptions:  diazepam (VALIUM) 5 MG tablet Take 1 tablet by mouth every 12 (twelve) hours as needed for Anxiety (muscle spasm). Disp: 60 tablet Rfl: 0   Tapentadol HCl (NUCYNTA) 150 MG TB12 Take 1 tablet by mouth 4 (four) times daily. Disp: 84 tablet Rfl: 0   oxycodone (OXYCONTIN) 10 MG 12 hr tablet Take 1 tablet by mouth daily. Disp: 30 tablet Rfl: 0   lidocaine (LIDODERM) 5 % patch Place 1 patch onto the skin daily. Disp: 30 patch Rfl: 3   doxazosin (CARDURA) 1 MG tablet Take 1 tablet by mouth nightly. Disp: 30 tablet Rfl: 2   SYNTHROID 125 MCG tablet Take 1 tablet by mouth every morning. Disp: 90 tablet Rfl: 0   zolpidem (AMBIEN CR) 6.25 MG CR tablet Take 1 tablet by mouth nightly as needed for Sleep. Disp: 30 tablet Rfl: 0   zolpidem (AMBIEN) 5 MG tablet Take 1 tablet by mouth nightly as needed for Sleep. Disp: 30 tablet Rfl: 0   hydrOXYzine (ATARAX) 25 MG tablet Take 1 tablet by mouth every 8 (eight) hours as needed for Itching or Anxiety.  Disp: 30 tablet Rfl: 3   venlafaxine (EFFEXOR-XR) 75 MG 24 hr capsule Take 1 capsule by mouth 2 (two) times daily. Disp: 60 capsule Rfl: 3   EPINEPHrine (EPIPEN 2-PAK) 0.3 MG/0.3ML Injection Device Inject 0.3 mg into the muscle once as needed. Disp: 1 each Rfl: 1   lidocaine (LIDODERM) 5 % patch Place 1 patch onto the skin daily. Disp: 30 patch Rfl: 4   EXPIRED: gabapentin (NEURONTIN) 300 MG capsule Take 1 capsule by mouth 3 (three) times daily. Disp: 90 capsule Rfl: 2   fluticasone-salmeterol (ADVAIR DISKUS) 500-50 MCG/DOSE AEPB Aerosol Powder Inhale 1 puff into the lungs every 12 (twelve) hours. Disp: 180 each Rfl: 4   buPROPion (WELLBUTRIN XL) 300 MG 24 hr tablet Take 1 tablet by mouth daily. Disp: 90 tablet Rfl: 4   albuterol (PROAIR HFA) 108 (90 BASE) MCG/ACT inhaler Inhale 2 puffs into the lungs every 6 (six) hours as needed for Wheezing. Disp:  1 Inhaler Rfl: 3   hydrochlorothiazide (HYDRODIURIL) 25 MG tablet Take 1 tablet by mouth daily. Disp: 90 tablet Rfl: 4   amlodipine (NORVASC) 5 MG tablet Take 1 tablet by mouth daily. Disp: 90 tablet Rfl: 4   Multiple Vitamin (MULTIVITAMIN) tablet Take 1 tablet by mouth daily. Disp: 30 tablet Rfl: 12   EXPIRED: QUEtiapine (SEROQUEL) 25 MG tablet Take  by mouth. Take 2 tablets by mouth qhs. Then take 1 tablet bid prn anxiety. Disp: 90 tablet Rfl: 0   EXPIRED: furosemide (LASIX) 20 MG tablet Take 1 tablet by mouth 2 (two) times daily. Disp: 60 tablet Rfl: 1   EXPIRED: clindamycin-benzoyl peroxide (BENZACLIN) gel Apply  topically 2 (two) times daily. Disp: 50 g Rfl: 4     No current facility-administered medications for this visit.      Objective:    Vascular:  DP and PT pulses 2/4.  Capillary refill time normal, <3 seconds.      Dermatological:      Orthopedic:  Manual muscle testing 5/5 bilaterally.    Neurological:  Radiating pain up her right leg.  Protective sensation and vibratory sensation intact bilaterally.         Following sterile skin preparation, a steroid  injection was performed at right foot posterior tibial nerve consisting of 4.75 cc of 0.5% plain bupivacaine and 0.25 cc of 4 mg/ml of dexamethasone sodium phosphate.  A second injection consisting of 4.75 cc of 0.5% plain bupivacaine and 0.25 cc of 4 mg/ml of dexamethasone sodium phosphate was injected into the common peroneal nerve on right leg.  Patient tolerated injection well with no complications.

## 2011-12-13 NOTE — Progress Notes (Signed)
Pt seen with PMS4 Peyton.  I concur with their assessment, and confirm that this treatment plan is correct.

## 2011-12-15 ENCOUNTER — Other Ambulatory Visit (HOSPITAL_BASED_OUTPATIENT_CLINIC_OR_DEPARTMENT_OTHER): Payer: Self-pay | Admitting: Podiatrist

## 2011-12-15 ENCOUNTER — Other Ambulatory Visit (HOSPITAL_BASED_OUTPATIENT_CLINIC_OR_DEPARTMENT_OTHER): Payer: Self-pay | Admitting: Registered Nurse

## 2011-12-15 ENCOUNTER — Encounter (HOSPITAL_BASED_OUTPATIENT_CLINIC_OR_DEPARTMENT_OTHER): Payer: Self-pay | Admitting: Internal Medicine

## 2011-12-15 DIAGNOSIS — J45901 Unspecified asthma with (acute) exacerbation: Secondary | ICD-10-CM

## 2011-12-15 NOTE — Progress Notes (Signed)
Original authorizing provider: Mitchell Heir, MD [name] would like a refill of the following medications: fluticasone-salmeterol (ADVAIR DISKUS) 500-50 MCG/DOSE AEPB Aerosol Powder Mitchell Heir, MD] albuterol (PROAIR HFA) 108 (90 BASE) MCG/ACT inhaler Mitchell Heir, MD] Preferred pharmacy: [facility] [address] - [place], [place] - [address] Comment:

## 2011-12-16 ENCOUNTER — Ambulatory Visit (HOSPITAL_BASED_OUTPATIENT_CLINIC_OR_DEPARTMENT_OTHER): Payer: Medicaid Other | Admitting: Podiatrist

## 2011-12-16 ENCOUNTER — Encounter (HOSPITAL_BASED_OUTPATIENT_CLINIC_OR_DEPARTMENT_OTHER): Payer: Self-pay | Admitting: Internal Medicine

## 2011-12-16 DIAGNOSIS — M79673 Pain in unspecified foot: Secondary | ICD-10-CM

## 2011-12-16 DIAGNOSIS — G9050 Complex regional pain syndrome I, unspecified: Secondary | ICD-10-CM

## 2011-12-16 MED ORDER — PREGABALIN 100 MG PO CAPS
100.0000 mg | ORAL_CAPSULE | Freq: Three times a day (TID) | ORAL | Status: DC
Start: 2011-12-16 — End: 2011-12-30

## 2011-12-16 NOTE — Progress Notes (Signed)
SUBJECTIVE:  Madison Mckay is a 41 year old female who presents to clinic for follow up of reflex sympathetic dystrophy in RIGHT leg approximately 2 years s/p work injury with biweekly steroid injections with local anesthetic to the posterior tibial nerve and common peroneal nerve, RIGHT. Patient states injury occurred in July 2011. Patient states she is in a lot of pain. Patient states she was unable to sleep last night due to the severity of pain she was experiencing.     Patient's last injection was on 11/13/11, 3 days ago. Patient states she started feeling "pins and needles" yesterday morning (11/15/11) which then progressed to extreme pain by the afternoon.     Patient states that she has been exercising - including yoga, pilates and walking for 5-6 miles. Patient states that exercising does not change the quality or amount of pain that she is in.     Patient uses TENS unit everyday. Patient states TENS unit does not help to relieve her pain when she is in severe pain. Patient states using oxycodone for pain management. Patient states the oxycodone helps "take off some of the edge of the pain" but does not completely resolve her pain. Patient states using lidoderm patches. Patient states injections completely relieve her pain, although for only a short period of time.     ROS: Patient denies any fever/chills/nausea/vomiting/SOB.       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     Comment: crush injury right foot       MEDICATIONS:    Current Outpatient Prescriptions:  diazepam (VALIUM) 5 MG tablet Take 1 tablet by mouth every 12 (twelve) hours as needed for  Anxiety (muscle spasm). Disp: 60 tablet Rfl: 0   Tapentadol HCl (NUCYNTA) 150 MG TB12 Take 1 tablet by mouth 4 (four) times daily. Disp: 84 tablet Rfl: 0   oxycodone (OXYCONTIN) 10 MG 12 hr tablet Take 1 tablet by mouth daily. Disp: 30 tablet Rfl: 0   lidocaine (LIDODERM) 5 % patch Place 1 patch onto the skin daily. Disp: 30 patch Rfl: 3   doxazosin (CARDURA) 1 MG tablet Take 1 tablet by mouth nightly. Disp: 30 tablet Rfl: 2   SYNTHROID 125 MCG tablet Take 1 tablet by mouth every morning. Disp: 90 tablet Rfl: 0   zolpidem (AMBIEN CR) 6.25 MG CR tablet Take 1 tablet by mouth nightly as needed for Sleep. Disp: 30 tablet Rfl: 0   zolpidem (AMBIEN) 5 MG tablet Take 1 tablet by mouth nightly as needed for Sleep. Disp: 30 tablet Rfl: 0   hydrOXYzine (ATARAX) 25 MG tablet Take 1 tablet by mouth every 8 (eight) hours as needed for Itching or Anxiety. Disp: 30 tablet Rfl: 3   EPINEPHrine (EPIPEN 2-PAK) 0.3 MG/0.3ML Injection Device Inject 0.3 mg into the muscle once as needed. Disp: 1 each Rfl: 1   lidocaine (LIDODERM) 5 % patch Place 1 patch onto the skin daily. Disp: 30 patch Rfl: 4   EXPIRED: gabapentin (NEURONTIN) 300 MG capsule Take  1 capsule by mouth 3 (three) times daily. Disp: 90 capsule Rfl: 2   fluticasone-salmeterol (ADVAIR DISKUS) 500-50 MCG/DOSE AEPB Aerosol Powder Inhale 1 puff into the lungs every 12 (twelve) hours. Disp: 180 each Rfl: 4   buPROPion (WELLBUTRIN XL) 300 MG 24 hr tablet Take 1 tablet by mouth daily. Disp: 90 tablet Rfl: 4   albuterol (PROAIR HFA) 108 (90 BASE) MCG/ACT inhaler Inhale 2 puffs into the lungs every 6 (six) hours as needed for Wheezing. Disp: 1 Inhaler Rfl: 3   hydrochlorothiazide (HYDRODIURIL) 25 MG tablet Take 1 tablet by mouth daily. Disp: 90 tablet Rfl: 4   amlodipine (NORVASC) 5 MG tablet Take 1 tablet by mouth daily. Disp: 90 tablet Rfl: 4   Multiple Vitamin (MULTIVITAMIN) tablet Take 1 tablet by mouth daily. Disp: 30 tablet Rfl: 12   EXPIRED: QUEtiapine (SEROQUEL) 25 MG tablet  Take  by mouth. Take 2 tablets by mouth qhs. Then take 1 tablet bid prn anxiety. Disp: 90 tablet Rfl: 0   EXPIRED: furosemide (LASIX) 20 MG tablet Take 1 tablet by mouth 2 (two) times daily. Disp: 60 tablet Rfl: 1   EXPIRED: clindamycin-benzoyl peroxide (BENZACLIN) gel Apply  topically 2 (two) times daily. Disp: 50 g Rfl: 4     No current facility-administered medications for this visit.    ALLERGIES:  Review of Patient's Allergies indicates:   Oxycodone                   Comment:Hives, other narcotics OK   Penicillins             Hives   Erythromycin            Nausea Only    Comment:Stomach pain severe no nausea, can take             azithromycin   Fluoxetine              Rash    OBJECTIVE:      PHYSICAL EXAM:    General: Patient appears A&O x 3. Patient appears to be in some distress and discomfort.  Vasc: DP/PT pulses palpable b/l. Capillary refill < 3 seconds b/l.  Derm: No open lesions. No macerations. Clean, dry, intact interspaces.   Neuro: Gross neurological exam intact to digits. Hyperesthesia to right foot.   Musc: Gross motor exam intact to digits.    ASSESSMENT:   Reflex sympathetic dystrophy s/p work injury - RIGHT leg/foot.       PLAN:  - Following sterile skin preparation with alcohol, a steroid injection was consisting of 4.75 cc of 0.5% bupivacaine with epinephrine and 0.25 cc of 4 mg/ml of dexamethasone sodium phosphate to the posterior tibial nerve, RIGHT.  A second injection consisting of 4.75 cc of 0.5% bupivacaine with epinephrine and 0.25 cc of 4 mg/ml of dexamethasone sodium phosphate was injected into the common peroneal nerve, RIGHT.  - Applied bandaids to injection sites.   - Patient tolerated injection well with no complications.    - Rx: lyrica 100mg  SIG: 1 tab po TID  - Patient return to clinic next week for follow up and steroid block.       Tia Alert, MS-4

## 2011-12-20 ENCOUNTER — Ambulatory Visit (HOSPITAL_BASED_OUTPATIENT_CLINIC_OR_DEPARTMENT_OTHER): Payer: Medicaid Other | Admitting: Podiatrist

## 2011-12-20 ENCOUNTER — Encounter (HOSPITAL_BASED_OUTPATIENT_CLINIC_OR_DEPARTMENT_OTHER): Payer: Self-pay | Admitting: Podiatrist

## 2011-12-20 DIAGNOSIS — G9050 Complex regional pain syndrome I, unspecified: Secondary | ICD-10-CM

## 2011-12-20 MED ORDER — ALBUTEROL SULFATE HFA 108 (90 BASE) MCG/ACT IN AERS
2.0000 | INHALATION_SPRAY | Freq: Four times a day (QID) | RESPIRATORY_TRACT | Status: DC | PRN
Start: 2011-12-15 — End: 2012-03-17

## 2011-12-20 MED ORDER — FLUTICASONE-SALMETEROL 500-50 MCG/DOSE IN AEPB
1.0000 | INHALATION_SPRAY | Freq: Two times a day (BID) | RESPIRATORY_TRACT | Status: AC
Start: 2011-12-15 — End: 2016-03-31

## 2011-12-20 NOTE — Progress Notes (Signed)
Date of Service: 12/20/2011    SUBJECTIVE:  The patient presents to the office today with continued pain in her right foot and leg.  She reports that over the last 48 hours, the pain has been extremely severe.  There has been no history of contusions since her original injury.  She denies fever, chills and night sweats.  She did vomit yesterday.  Her foot appears to feel just slightly warm in temperature with touch.  She has no evidence of open wounds, no signs of infection.    PLAN:  Treatment today  consisted of the standard nerve blocks to her common peroneal and posterior tibial nerves consisting of 5 mL of bupivacaine with epinephrine and 0.25 mL of dexamethasone 4 mg/mL.  Plan is for her to follow up with [place] later this week.    ___________________________  Reviewed and Electronically Signed By: Naoma Diener DPM  Sig Date: 12/21/2011  Sig Time: 12:52:34  Dictated By: Naoma Diener DPM  Dict Date: 12/20/2011 Dict Time: 03 52 PM    Dictation Date and Time:12/20/2011 15:52:24  Transcription Date and Time:12/20/2011 16:14:18  eScription Dictation id: [id number] Confirmation # :[id number]      DICTATED BY: Jacarra Bobak S [facility] DPMADAM S [facility] DPMD:12/20/2011 15:52:24 T:12/20/2011 16:14:18 AN Job#: [id number]

## 2011-12-20 NOTE — Progress Notes (Signed)
Patient was to follow-up with Dr. Kem Kays (pulm) and have PFTs.   He did not think that she had asthma but rather vocal cord dysfunction and needs further evaluation.   I will sign the prescription for the inhalers today contingent as long as she follows through with the recommended testing and reschedules with pulmonology.

## 2011-12-20 NOTE — Progress Notes (Signed)
Message left on voice mail.

## 2011-12-20 NOTE — Progress Notes (Signed)
Sending again to PCP for auth.

## 2011-12-20 NOTE — Progress Notes (Signed)
Foot Pain              SUBJECTIVE:   This is a 41 year old female who presents for twice weekly f/u of RSD to her Right leg. 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 She became nauseated and vomited last night due to the pain. She has been taking Nucynta for pain relief, though it does not completely alleviate her pain.   ROS: Patient denies any other complaints today.   RIGHT lower extremity: Dorsalis pedis and posterior tibial pulses are palpable. There is no edema or discoloration to the extremity. The skin temperature is within normal limits. There are no open lesions. There is mild hypopigmentation of the skin at the level of the fibular neck from steroid injection therapy.    ASSESSMENT:   Reflex sympathetic dystrophy, Right lower extremity .    PLAN:   Performed Right tibial nerve block at the level of the medial malleolus consisting of 4.75 cc of 0.5% bupivicaine with epi (1:200,000) and .25cc dexamethasone 4mg /ml. She received Right common peroneal nerve block at level of the fibular neck consisting of the same mixture. Patient to return to clinic on 7/18 for subsequent injections.

## 2011-12-21 ENCOUNTER — Ambulatory Visit (HOSPITAL_BASED_OUTPATIENT_CLINIC_OR_DEPARTMENT_OTHER): Payer: Medicaid Other | Admitting: Internal Medicine

## 2011-12-21 ENCOUNTER — Telehealth (HOSPITAL_BASED_OUTPATIENT_CLINIC_OR_DEPARTMENT_OTHER): Payer: Self-pay | Admitting: Registered Nurse

## 2011-12-21 ENCOUNTER — Telehealth (HOSPITAL_BASED_OUTPATIENT_CLINIC_OR_DEPARTMENT_OTHER): Payer: Self-pay | Admitting: Internal Medicine

## 2011-12-21 ENCOUNTER — Other Ambulatory Visit (HOSPITAL_BASED_OUTPATIENT_CLINIC_OR_DEPARTMENT_OTHER): Payer: Self-pay | Admitting: Internal Medicine

## 2011-12-21 ENCOUNTER — Encounter (HOSPITAL_BASED_OUTPATIENT_CLINIC_OR_DEPARTMENT_OTHER): Payer: Self-pay | Admitting: Internal Medicine

## 2011-12-21 ENCOUNTER — Other Ambulatory Visit (HOSPITAL_BASED_OUTPATIENT_CLINIC_OR_DEPARTMENT_OTHER): Payer: Self-pay | Admitting: Podiatrist

## 2011-12-21 ENCOUNTER — Other Ambulatory Visit (HOSPITAL_BASED_OUTPATIENT_CLINIC_OR_DEPARTMENT_OTHER): Payer: Self-pay | Admitting: Registered Nurse

## 2011-12-21 DIAGNOSIS — E039 Hypothyroidism, unspecified: Secondary | ICD-10-CM

## 2011-12-21 MED ORDER — SYNTHROID 125 MCG PO TABS
125.0000 ug | ORAL_TABLET | Freq: Every morning | ORAL | Status: DC
Start: 2011-12-21 — End: 2011-12-21

## 2011-12-21 MED ORDER — SYNTHROID 125 MCG PO TABS
125.0000 ug | ORAL_TABLET | Freq: Every morning | ORAL | Status: DC
Start: 2011-12-21 — End: 2013-03-05

## 2011-12-21 NOTE — Progress Notes (Signed)
Prior authorization request for Advair 500-50 Diskus  Clinical Indication: Asthma  Previous medications tried: Albuterol  Diagnostic testing information: None

## 2011-12-21 NOTE — Progress Notes (Signed)
Prior authorization required for medication: ADVAIR 500-50 DISKUS  Membership ID (insurance): [id number]   if Medicare insurance , Prescription drug plan: N/A

## 2011-12-21 NOTE — Progress Notes (Signed)
Call to [facility] to dispense generic (ok per pt).

## 2011-12-21 NOTE — Progress Notes (Addendum)
Prior authorization required for brand name  Insurance will pay for genetic  Genetic authorized per [employer] w/ questions about rx for SYNTHROID 125 MCG tablet.

## 2011-12-21 NOTE — Progress Notes (Signed)
Message      I need a emergency refill for my thyroid medication. I have been out for a week. Dr. Dolphus Jenny man wrote the prescription for 90 and no refill. [facility] will only fill 30 and give me no refills. I really need this medication as soon as possible because a week is a long time to be off of it. Thank you.     Madison Mckay is a 41 year old female calling to request a refill of levoxyl.  Thyroid Med:  TSH:  THYROID STIM HORMONE (uIU/mL)   Date  Value    07/06/2010  0.46    ---------- TSH Screen:  No results found for this basename: TSHSC      Documented patient preferred pharmacies:    [facility] - [id number] - [place], [place] - [address]  Phone: [phone] Fax: [phone]

## 2011-12-22 ENCOUNTER — Other Ambulatory Visit (HOSPITAL_BASED_OUTPATIENT_CLINIC_OR_DEPARTMENT_OTHER): Payer: Self-pay | Admitting: Internal Medicine

## 2011-12-22 NOTE — Progress Notes (Signed)
Patient has [facility] for prescription coverage.  I.D.#: [id number]    Filled out Inhaled Respiratory Agents Prior Authorization Request form. Scanned form into Epic under Careers information officer (PA for Advair Diskus).    Please print out form to be reviewed, signed, and faxed to the insurance company.

## 2011-12-22 NOTE — Progress Notes (Signed)
Please call and ask patient:    1. What inhalers she used prior to Advair - specifically did she ever use Symbicort? I think Advair not on formulary for her insurance b/c now requiring a PA.    If she did use Symbicort, did it work and how long did she try it.   When where her last PFTs?    Need this information to fill out the PA and haven't yet heard back from her from email.

## 2011-12-22 NOTE — Progress Notes (Signed)
PA placed in provider's mailbox for completion.

## 2011-12-23 ENCOUNTER — Ambulatory Visit (HOSPITAL_BASED_OUTPATIENT_CLINIC_OR_DEPARTMENT_OTHER): Payer: Medicaid Other | Admitting: Podiatrist

## 2011-12-23 ENCOUNTER — Encounter (HOSPITAL_BASED_OUTPATIENT_CLINIC_OR_DEPARTMENT_OTHER): Payer: Self-pay | Admitting: Podiatrist

## 2011-12-23 DIAGNOSIS — M792 Neuralgia and neuritis, unspecified: Secondary | ICD-10-CM

## 2011-12-23 DIAGNOSIS — M79673 Pain in unspecified foot: Secondary | ICD-10-CM

## 2011-12-23 DIAGNOSIS — G9050 Complex regional pain syndrome I, unspecified: Secondary | ICD-10-CM

## 2011-12-23 MED ORDER — DEXAMETHASONE SODIUM PHOSPHATE 4 MG/ML IJ SOLN
2.00 mg | Freq: Once | INTRAMUSCULAR | Status: AC
Start: 2011-12-23 — End: 2011-12-23

## 2011-12-23 NOTE — Progress Notes (Signed)
HPI:  Madison Mckay is a 41 year old female who presents for follow up of right lower extremity CRPS. Her last clinical visit in the podiatric surgical clinic was on 12/20/11 at which point standard common peroneal and tibial nerve blocks were performed. She returns today complaining of her chronic symptomatology. She was contemplating presenting to the ED this past Sunday due to severe pain. She has been experiencing symptoms for approximately 2 years at this time and this chronic pain syndrome is likely secondary to a crush injury she sustained to the RIGHT foot at work. She undergoes injections weekly. She has had persistence of her symptoms despite physical therapy, TENS, calcium channel blocker therapy, anticonvulsants.    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     Comment: crush injury right foot      PAST SURGICAL HISTORY:       Past Surgical History    TOTAL ABDOMINAL HYSTERECT W/WO RMVL TUBE OVARY      Comment just TAH no ovary removal, 2005; laparascopies x 2 before TAH for endometriosis    ACL REPAIR      Comment left 2004, cadavaric acl    OB ANTEPARTUM CARE CESAREAN DLVR & POSTPARTUM      Comment x2, 97 and 98    ROTATOR CUFF REPAIR      Comment Labral repair anchor not sure if plastic or metal but had MRI with this in place without problems, on right shoulder     SOCIAL HISTORY:      Smoking status: Never Smoker     Smokeless tobacco:     Alcohol Use: No       CURRENT MEDICATIONS:     Current Outpatient Prescriptions:  SYNTHROID 125 MCG tablet Take 1 tablet by mouth every morning. Disp: 90 tablet Rfl: 3   fluticasone-salmeterol  (ADVAIR DISKUS) 500-50 MCG/DOSE AEPB Aerosol Powder Inhale 1 puff into the lungs every 12 (twelve) hours. Disp: 180 each Rfl: 0   albuterol (PROAIR HFA) 108 (90 BASE) MCG/ACT inhaler Inhale 2 puffs into the lungs every 6 (six) hours as needed for Wheezing. Disp: 1 Inhaler Rfl: 0   pregabalin (LYRICA) 100 MG capsule Take 1 capsule by mouth 3 (three) times daily. Disp: 90 capsule Rfl: 2   diazepam (VALIUM) 5 MG tablet Take 1 tablet by mouth every 12 (twelve) hours as needed for Anxiety (muscle spasm). Disp: 60 tablet Rfl: 0   Tapentadol HCl (NUCYNTA) 150 MG TB12 Take 1 tablet by mouth 4 (four) times daily. Disp: 84 tablet Rfl: 0   oxycodone (OXYCONTIN) 10 MG 12 hr tablet Take 1 tablet by mouth daily. Disp: 30 tablet Rfl: 0   doxazosin (CARDURA) 1 MG tablet Take 1 tablet by mouth nightly. Disp: 30 tablet Rfl: 2   hydrOXYzine (ATARAX) 25 MG tablet Take 1 tablet by mouth every 8 (eight) hours as needed for Itching or Anxiety. Disp: 30 tablet Rfl: 3   EPINEPHrine (EPIPEN 2-PAK) 0.3 MG/0.3ML Injection Device  Inject 0.3 mg into the muscle once as needed. Disp: 1 each Rfl: 1   lidocaine (LIDODERM) 5 % patch Place 1 patch onto the skin daily. Disp: 30 patch Rfl: 4   buPROPion (WELLBUTRIN XL) 300 MG 24 hr tablet Take 1 tablet by mouth daily. Disp: 90 tablet Rfl: 4   hydrochlorothiazide (HYDRODIURIL) 25 MG tablet Take 1 tablet by mouth daily. Disp: 90 tablet Rfl: 4   amlodipine (NORVASC) 5 MG tablet Take 1 tablet by mouth daily. Disp: 90 tablet Rfl: 4   Multiple Vitamin (MULTIVITAMIN) tablet Take 1 tablet by mouth daily. Disp: 30 tablet Rfl: 12   lidocaine (LIDODERM) 5 % patch Place 1 patch onto the skin daily. Disp: 30 patch Rfl: 3   EXPIRED: gabapentin (NEURONTIN) 300 MG capsule Take 1 capsule by mouth 3 (three) times daily. Disp: 90 capsule Rfl: 2   EXPIRED: QUEtiapine (SEROQUEL) 25 MG tablet Take  by mouth. Take 2 tablets by mouth qhs. Then take 1 tablet bid prn anxiety. Disp: 90 tablet Rfl: 0   EXPIRED: furosemide (LASIX)  20 MG tablet Take 1 tablet by mouth 2 (two) times daily. Disp: 60 tablet Rfl: 1   EXPIRED: clindamycin-benzoyl peroxide (BENZACLIN) gel Apply  topically 2 (two) times daily. Disp: 50 g Rfl: 4     No current facility-administered medications for this visit.    ALLERGIES:  Review of Patient's Allergies indicates:   Oxycodone                   Comment:Hives, other narcotics OK   Penicillins             Hives   Erythromycin            Nausea Only    Comment:Stomach pain severe no nausea, can take             azithromycin   Fluoxetine              Rash    LOWER EXTREMITY PHYSICAL EXAM:  GENERAL: AAOx3. No acute distress. Pleasant.  VASCULAR: dorsalis pedis and posterior tibial pulses are palpable bilaterally; capillary refill time < 3 sec x 10 toes; mild diffuse pedal edema, no varicosities, no telangiectasias.  DERMATOLOGICAL: No open lesions. Reticulated pattern of erythema present to the dorsal foot.     ASSESSMENT/PLAN:  This is a 41 year old female with RIGHT lower extremity chronic regional pain syndrome with persistence of symptoms. Treatment options were discussed with the patient. She refuses invasive intervention. At the patient's next visit, will discuss possibility of trying Calcitonin therapy which has been shown to both retard bone absorption and has an analgesic effect as well. The optimal dose is uncertain but daily dosages have ranged from 300-400 units per day in various studies. It can be delivered through the intra-nasal route [[place]. Br J Rheumatol 1991; 30:291, [place]. Pain 1992; 48:171.]. Today both a RIGHT common peroneal + tibial nerve block was infiltrated using 4.75 cc of 0.5% marcaine + epi + 0.25-cc of dexamethasone. She tolerated this procedure well without complication. Patient will return next week for follow up.    Electronically signed by: Renette Butters, 12/23/2011 9:34 AM  Pager [phone]  This note is electronically signed in the electronic medical record.

## 2011-12-23 NOTE — Progress Notes (Signed)
SUBJECTIVE:  Madison Mckay is a 41 year old female who presents to clinic for follow up of reflex sympathetic dystrophy in RIGHT leg approximately 2 years s/p work injury with biweekly steroid injections with local anesthetic to the posterior tibial nerve and common peroneal nerve, RIGHT. Patient states injury occurred in July 2011. Patient states she is in a lot of pain. Patient states she was unable to sleep last night due to the severity of pain she was experiencing.     Patient's last injection was on 11/13/11, 3 days ago. Patient states she started feeling "pins and needles" yesterday morning (11/15/11) which then progressed to extreme pain by the afternoon.     Patient states that she has been exercising - including yoga, pilates and walking for 5-6 miles. Patient states that exercising does not change the quality or amount of pain that she is in.     Patient uses TENS unit everyday. Patient states TENS unit does not help to relieve her pain when she is in severe pain. Patient states using oxycodone for pain management. Patient states the oxycodone helps "take off some of the edge of the pain" but does not completely resolve her pain. Patient states using lidoderm patches. Patient states injections completely relieve her pain, although for only a short period of time.     ROS: Patient denies any fever/chills/nausea/vomiting/SOB.       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 Disp: 90 tablet Rfl: 3    fluticasone-salmeterol (ADVAIR DISKUS) 500-50 MCG/DOSE AEPB Aerosol Powder Inhale 1 puff into the lungs every 12 (twelve) hours. Disp: 180 each Rfl: 0   albuterol (PROAIR HFA) 108 (90 BASE) MCG/ACT inhaler Inhale 2 puffs into the lungs every 6 (six) hours as needed for Wheezing. Disp: 1 Inhaler Rfl: 0   pregabalin (LYRICA) 100 MG capsule Take 1 capsule by mouth 3 (three) times daily. Disp: 90 capsule Rfl: 2   diazepam (VALIUM) 5 MG tablet Take 1 tablet by mouth every 12 (twelve) hours as needed for Anxiety (muscle spasm). Disp: 60 tablet Rfl: 0   Tapentadol HCl (NUCYNTA) 150 MG TB12 Take 1 tablet by mouth 4 (four) times daily. Disp: 84 tablet Rfl: 0   oxycodone (OXYCONTIN) 10 MG 12 hr tablet Take 1 tablet by mouth daily. Disp: 30 tablet Rfl: 0   lidocaine (LIDODERM) 5 % patch Place 1 patch onto the skin daily. Disp: 30 patch Rfl: 3   doxazosin (CARDURA) 1 MG tablet Take 1 tablet by mouth nightly. Disp: 30 tablet Rfl: 2   hydrOXYzine (ATARAX) 25 MG tablet Take 1 tablet by mouth every 8 (eight) hours as needed for Itching or Anxiety. Disp: 30 tablet Rfl: 3   EPINEPHrine (EPIPEN 2-PAK) 0.3 MG/0.3ML Injection Device Inject 0.3 mg into the muscle once as needed. Disp: 1 each Rfl:  1   lidocaine (LIDODERM) 5 % patch Place 1 patch onto the skin daily. Disp: 30 patch Rfl: 4   EXPIRED: gabapentin (NEURONTIN) 300 MG capsule Take 1 capsule by mouth 3 (three) times daily. Disp: 90 capsule Rfl: 2   buPROPion (WELLBUTRIN XL) 300 MG 24 hr tablet Take 1 tablet by mouth daily. Disp: 90 tablet Rfl: 4   hydrochlorothiazide (HYDRODIURIL) 25 MG tablet Take 1 tablet by mouth daily. Disp: 90 tablet Rfl: 4   amlodipine (NORVASC) 5 MG tablet Take 1 tablet by mouth daily. Disp: 90 tablet Rfl: 4   Multiple Vitamin (MULTIVITAMIN) tablet Take 1 tablet by mouth daily. Disp: 30 tablet Rfl: 12   EXPIRED: QUEtiapine (SEROQUEL) 25 MG tablet Take  by mouth. Take 2 tablets by mouth qhs. Then take 1 tablet bid prn anxiety. Disp: 90 tablet Rfl: 0    EXPIRED: furosemide (LASIX) 20 MG tablet Take 1 tablet by mouth 2 (two) times daily. Disp: 60 tablet Rfl: 1   EXPIRED: clindamycin-benzoyl peroxide (BENZACLIN) gel Apply  topically 2 (two) times daily. Disp: 50 g Rfl: 4     No current facility-administered medications for this visit.    ALLERGIES:  Review of Patient's Allergies indicates:   Oxycodone                   Comment:Hives, other narcotics OK   Penicillins             Hives   Erythromycin            Nausea Only    Comment:Stomach pain severe no nausea, can take             azithromycin   Fluoxetine              Rash    OBJECTIVE:      PHYSICAL EXAM:    General: Patient appears A&O x 3. Patient appears to be in some distress and discomfort.  Vasc: DP/PT pulses palpable b/l. Capillary refill < 3 seconds b/l.  Derm: No open lesions. No macerations. Clean, dry, intact interspaces.   Neuro: Gross neurological exam intact to digits. Hyperesthesia to right foot.   Musc: Gross motor exam intact to digits.    ASSESSMENT:   Reflex sympathetic dystrophy s/p work injury - RIGHT leg/foot.       PLAN:  - Following sterile skin preparation with alcohol, a steroid injection was consisting of 4.75 cc of 0.5% bupivacaine with epinephrine and 0.25 cc of 4 mg/ml of dexamethasone sodium phosphate to the posterior tibial nerve, RIGHT.  A second injection consisting of 4.75 cc of 0.5% bupivacaine with epinephrine and 0.25 cc of 4 mg/ml of dexamethasone sodium phosphate was injected into the common peroneal nerve, RIGHT.  - Applied bandaids to injection sites.   - Patient tolerated injection well with no complications.    - Rx: lyrica 100mg  SIG: 1 tab po TID  - Patient return to clinic next week for follow up and steroid block.       Pt was treated in the presence of Eve Hui, MS-4

## 2011-12-24 NOTE — Progress Notes (Signed)
Received [facility] APPROVAL letter for Advair 500/50 - Scanned into [employer] ( please review )        -PA will expire on 12/23/2013     -PA # [id number]    Informed pharmacy ( copay $3.65 ) and notified patient .

## 2011-12-27 ENCOUNTER — Ambulatory Visit (HOSPITAL_BASED_OUTPATIENT_CLINIC_OR_DEPARTMENT_OTHER): Payer: Medicaid Other | Admitting: Foot & Ankle Surgery

## 2011-12-27 ENCOUNTER — Encounter (HOSPITAL_BASED_OUTPATIENT_CLINIC_OR_DEPARTMENT_OTHER): Payer: Self-pay | Admitting: Foot & Ankle Surgery

## 2011-12-27 DIAGNOSIS — G9050 Complex regional pain syndrome I, unspecified: Secondary | ICD-10-CM

## 2011-12-27 MED ORDER — DEXAMETHASONE SODIUM PHOSPHATE 4 MG/ML IJ SOLN
2.00 mg | Freq: Once | INTRAMUSCULAR | Status: AC
Start: 2011-12-27 — End: 2011-12-27

## 2011-12-27 NOTE — Progress Notes (Signed)
41 year old female patient of Dr. Christeen Douglas with CRPS, right foot. Patient is following up for bi-weekly nerve blocks. She states she is doing well. Pain is well controlled with the injections. She continues to ambulate without difficulty in regular shoes.     ROS: Unremarkable. Pain right leg.    PMH, Meds, Allergies reviewed.     Physical Exam:  - Gen: A&Ox3, NAD  - Right LE: DP/PT pulses palpable. Gross sensation is intact. Skin coloration is wnl. No pain with light touch or ROM. No edema. No mottling of skin. No temperature changes vs. Contralateral limb.    A/P: CRPS, right foot  - Patient's name and date of birth confirmed. A posterior tibial and common peroneal nerve block were administered, each block consisting of 4.75cc 0.5% Marcaine with epinephrine and 0.25cc Dexamethasone 4mg /ml  into each area.  Patient tolerated procedure well with no complications. She will follow up at the end of the week for another block.

## 2011-12-30 ENCOUNTER — Ambulatory Visit (HOSPITAL_BASED_OUTPATIENT_CLINIC_OR_DEPARTMENT_OTHER): Payer: PRIVATE HEALTH INSURANCE | Admitting: Internal Medicine

## 2011-12-30 ENCOUNTER — Ambulatory Visit (HOSPITAL_BASED_OUTPATIENT_CLINIC_OR_DEPARTMENT_OTHER): Payer: Medicaid Other | Admitting: Podiatrist

## 2011-12-30 VITALS — BP 136/100 | HR 90 | Ht 62.5 in | Wt 160.0 lb

## 2011-12-30 DIAGNOSIS — G9050 Complex regional pain syndrome I, unspecified: Secondary | ICD-10-CM

## 2011-12-30 DIAGNOSIS — I7300 Raynaud's syndrome without gangrene: Secondary | ICD-10-CM

## 2011-12-30 MED ORDER — ZOLPIDEM TARTRATE 5 MG PO TABS
5.0000 mg | ORAL_TABLET | Freq: Every evening | ORAL | Status: AC | PRN
Start: 2011-12-30 — End: 2012-01-29

## 2011-12-30 MED ORDER — AMLODIPINE BESYLATE 5 MG PO TABS
5.0000 mg | ORAL_TABLET | Freq: Every day | ORAL | Status: AC
Start: 2011-12-30 — End: 2012-12-29

## 2011-12-30 MED ORDER — ZOLPIDEM TARTRATE ER 6.25 MG PO TBCR
6.2500 mg | EXTENDED_RELEASE_TABLET | Freq: Every evening | ORAL | Status: AC | PRN
Start: 2011-12-30 — End: 2012-01-29

## 2011-12-30 MED ORDER — DOXAZOSIN MESYLATE 1 MG PO TABS
1.0000 mg | ORAL_TABLET | Freq: Every evening | ORAL | Status: DC
Start: 2011-12-30 — End: 2012-10-13

## 2011-12-30 NOTE — Patient Instructions (Signed)
Try a tricyclic antidepressant for the nerve pain.  Cymbalta potentially may be useful for "chronic pain".

## 2011-12-30 NOTE — Progress Notes (Signed)
Subjective:    CC:  Patient is 41 y/o who presents to clinic today for continued injection therapy CRPS of right leg.  She presents for her biweekly common peroneal and tibial nerve blocks.  She developed CRPS in right foot secondary to a crush injury she sustained at work.  She reports a lot of pain today.  She said the pain was so severe the other day that it radiated up into her hip.  She is concerned because her vision was blurred when she experienced the pain.  She denies f/c/n/v.      There have been no changes in patient's medications or medical history since last appointment (12/27/2011).      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     Comment: crush injury right foot       Current outpatient prescriptions:zolpidem (AMBIEN CR) 6.25 MG CR tablet, Take 1 tablet by mouth nightly as needed for Sleep., Disp: 30 tablet, Rfl: 0;  zolpidem (AMBIEN) 5 MG tablet, Take 1 tablet by mouth nightly as needed for Sleep., Disp: 30 tablet, Rfl: 0;  doxazosin (CARDURA) 1 MG tablet, Take 1 tablet by mouth nightly., Disp: 30 tablet, Rfl: 11;  amlodipine (NORVASC) 5 MG tablet, Take 1 tablet by mouth daily., Disp: 30 tablet, Rfl: 11  SYNTHROID 125 MCG tablet, Take 1 tablet by mouth every morning., Disp: 90 tablet, Rfl: 3;  fluticasone-salmeterol (ADVAIR DISKUS) 500-50 MCG/DOSE AEPB Aerosol Powder, Inhale 1 puff into the lungs every 12 (twelve) hours., Disp: 180 each, Rfl: 0;  albuterol (PROAIR HFA) 108 (90 BASE) MCG/ACT inhaler, Inhale 2 puffs into the lungs every 6 (six) hours as needed for Wheezing., Disp: 1 Inhaler, Rfl: 0  DISCONTD: pregabalin (LYRICA) 100 MG capsule, Take 1  capsule by mouth 3 (three) times daily., Disp: 90 capsule, Rfl: 2;  diazepam (VALIUM) 5 MG tablet, Take 1 tablet by mouth every 12 (twelve) hours as needed for Anxiety (muscle spasm)., Disp: 60 tablet, Rfl: 0;  Tapentadol HCl (NUCYNTA) 150 MG TB12, Take 1 tablet by mouth 4 (four) times daily., Disp: 84 tablet, Rfl: 0  oxycodone (OXYCONTIN) 10 MG 12 hr tablet, Take 1 tablet by mouth daily., Disp: 30 tablet, Rfl: 0;  lidocaine (LIDODERM) 5 % patch, Place 1 patch onto the skin daily., Disp: 30 patch, Rfl: 3;  DISCONTD: doxazosin (CARDURA) 1 MG tablet, Take 1 tablet by mouth nightly., Disp: 30 tablet, Rfl: 2;  DISCONTD: zolpidem (AMBIEN CR) 6.25 MG CR tablet, Take 1 tablet by mouth nightly as needed for Sleep., Disp: 30 tablet, Rfl: 0  DISCONTD: zolpidem (AMBIEN) 5 MG tablet, Take 1 tablet by mouth nightly as needed for Sleep., Disp: 30 tablet, Rfl: 0;  hydrOXYzine (ATARAX) 25 MG tablet, Take 1 tablet by mouth every 8 (eight) hours as needed for Itching or Anxiety., Disp: 30 tablet, Rfl: 3;  EPINEPHrine (EPIPEN 2-PAK) 0.3 MG/0.3ML Injection Device, Inject 0.3 mg into the muscle once as needed., Disp: 1 each, Rfl: 1  lidocaine (LIDODERM) 5 % patch, Place 1 patch  onto the skin daily., Disp: 30 patch, Rfl: 4;  EXPIRED: hydrochlorothiazide (HYDRODIURIL) 25 MG tablet, Take 1 tablet by mouth daily., Disp: 90 tablet, Rfl: 4;  EXPIRED: QUEtiapine (SEROQUEL) 25 MG tablet, Take  by mouth. Take 2 tablets by mouth qhs. Then take 1 tablet bid prn anxiety., Disp: 90 tablet, Rfl: 0  EXPIRED: furosemide (LASIX) 20 MG tablet, Take 1 tablet by mouth 2 (two) times daily., Disp: 60 tablet, Rfl: 1;  EXPIRED: clindamycin-benzoyl peroxide (BENZACLIN) gel, Apply  topically 2 (two) times daily., Disp: 50 g, Rfl: 4    Review of Patient's Allergies indicates:   Oxycodone                   Comment:Hives, other narcotics OK   Penicillins             Hives   Erythromycin            Nausea Only    Comment:Stomach pain severe no nausea, can take              azithromycin   Fluoxetine              Rash    ROS:  Denies headaches, dizziness.  Reports blurred vision last episode of shooting pain up into her hip.  Denies f/c/n/v.  Reports right leg pain.      Objective:  Vascular:  DP and PT pulses 2/4 bilaterally.  Capillary refill time is normal, <3 seconds bilaterally.  Examination reveals normal hair growth.  Dermatological:  Skin temperature and texture within normal limits b/l.  Orthopedic:  Manual muscle testing 5/5 bilaterally.  Neurological:  Protective sensation and vibratory sensation intact b/l.    Following sterile skin preparation, a steroid injection was performed at right foot posterior tibial nerve consisting of 4.75 cc of 0.5% plain bupivacaine and 0.25 cc of 4 mg/ml of dexamethasone sodium phosphate.  A second injection consisting of 4.75 cc of 0.5% plain bupivacaine and 0.25 cc of 4 mg/ml of dexamethasone sodium phosphate was injected into the common peroneal nerve on right leg.  Patient tolerated injection well with no complications.        Assessment/Plan:    41 y/o female presents with CRPS of right foot secondary to crush injury.  Patient presents bi-weekly for tibial and common peroneal nerve blocks.    Patient was evaluated and treated with all questions answered.  A steroid injection was performed at right foot posterior tibial nerve consisting of 4.75 cc of 0.5% plain bupivacaine and 0.25 cc of 4 mg/ml of dexamethasone sodium phosphate.  A second injection consisting of 4.75 cc of 0.5% plain bupivacaine and 0.25 cc of 4 mg/ml of dexamethasone sodium phosphate was injected into the common peroneal nerve on right leg.  Patient tolerated injection well with no complications.  Patient will be reappointed Monday, January 03, 2012.    Patient was advised to call sooner if any problems occur.      Stefanie Libel  PMS-4

## 2011-12-30 NOTE — Progress Notes (Signed)
The primary progress note for this visit has been dictated through E-Scription. It can be viewed as an attachment to this encounter or through Chart Review under the Other Tab as an Rheumatology Office Note.      Review of Systems: Constitutional, Eyes, ENT/Mouth, Cardiovascular, Respiratory, GI, GU, Neuro, Psych, Heme/Lymph, Skin, Musculoskeletal, and Endocrine systems were reviewed and are NEGATIVE, except for what is dictated in the note.

## 2011-12-30 NOTE — Progress Notes (Signed)
Date of Service: 12/30/2011    HISTORY OF PRESENT ILLNESS:  This is a follow-up visit for this 41 year old English-speaking woman with Raynaud's who has been on a low dose of amlodipine for many years.  She increased the amlodipine from 5 mg to 10 mg, but this caused worsening of her RSD pain in the right foot.  She decreased the amlodipine back to 5 mg and started doxazosin 1 mg.  She is tolerating the doxazosin.  She has had more edema in the past week at the end of the day, and she is not sure if it is related to the amlodipine.  The amlodipine has not worsened her pain.  In fact, she has noticed her foot turning blue much less frequently since starting it.  Today was a bad day for her, she had severe pain until she had her nerve block earlier this morning.  She tried gabapentin in the past, but did not tolerate it well.  She tried pregabalin in the past and restarted it recently to see if it would give her relief.  However, she had side effects so she discontinued it.  She has not been on a tricyclic antidepressant or an SNRI.  She has not developed any new or concerning symptoms such as aphthous ulcers, rash, or joint swelling.    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    CURRENT MEDICATIONS:  Synthroid, tapentadol, albuterol, diazepam, doxazosin, fluticasone-salmeterol, hydroxyzine p.r.n., lidocaine patch, oxycodone, zolpidem.    PHYSICAL EXAMINATION:  VITAL SIGNS:  Height is 5 feet 2-1/2 inches, weight is 160 pounds, blood pressure 136/100, pulse 90, BMI is 28.8.  GENERAL:  She is in no acute distress.  She is alert and oriented.  HEENT:   No malar rash.  Oropharynx clear.  NECK:  Supple.  LUNGS:  Clear.  EXTREMITIES:  No pitting edema.  SKIN:  No rashes.  No digital ulcerations.  The feet are warm and well perfused.  No sclerodactyly.  MUSCULOSKELETAL:  There is no synovitis that I appreciate.    LABORATORY DATA:  WBC 9.7; hematocrit 46; platelets 236,000; creatinine 0.7; GFR greater than 60; normal LFTs; albumin 4.5; TSH 0.46    ASSESSMENT:  A 41 year old woman with a history of Raynaud's and blue foot when her pain is severe from reflex sympathetic dystrophy that has shown improvement, not only with nerve blocks, but with a combination of a calcium channel blocker and an alpha blocker.  It is unlikely that the doxazosin is causing the edema.  It may be related to the humidity over the last couple of weeks.  I recommended keeping an eye on it and seeing how she does.  As for pain management, I recommended her speaking with her podiatrist about the possibility of a tricyclic antidepressant, and if not that, I am wondering if an SNRI such as Cymbalta, Effexor, or Savella may be beneficial.    The patient will stay on her blood pressure medications, and I will see her back in 6 months, earlier if needed.  If she has any new or concerning symptoms, she will let me know prior to that time.  ___________________________  Reviewed and Electronically Signed By: Charlane Ferretti MD  Sig Date: 01/12/2012  Sig Time: 14:34:58  Dictated By: Charlane Ferretti MD  Dict Date: 12/30/2011 Dict Time: 11 28 AM    Dictation Date and Time:12/30/2011 11:28:34  Transcription Date and Time:12/30/2011 12:23:11  eScription Dictation id: [id number] Confirmation # :[id number]      cc: AD[facility] DPM; Mitchell Heir MD    DICTATED BYCharlane Ferretti MDSUZANNE L Roniyah Llorens MDD:12/30/2011 11:28:34 T:12/30/2011 12:23:11 3N Job#: [id number]

## 2012-01-03 ENCOUNTER — Ambulatory Visit (HOSPITAL_BASED_OUTPATIENT_CLINIC_OR_DEPARTMENT_OTHER): Payer: Medicaid Other | Admitting: Podiatrist

## 2012-01-03 DIAGNOSIS — G9050 Complex regional pain syndrome I, unspecified: Secondary | ICD-10-CM

## 2012-01-03 MED ORDER — DEXAMETHASONE SODIUM PHOSPHATE 4 MG/ML IJ SOLN
2.00 mg | Freq: Once | INTRAMUSCULAR | Status: AC
Start: 2012-01-03 — End: 2012-01-03

## 2012-01-03 NOTE — Progress Notes (Signed)
Subjective:    CC:  Patient is 41 y/o who presents to clinic today for continued injection therapy CRPS of right leg.  She presents for her biweekly common peroneal and tibial nerve blocks.  She developed CRPS in right foot secondary to a crush injury she sustained at work.  She reports a lot of pain today.  She said the pain was so severe the other day that it radiated up into her hip.  She is concerned because her vision was blurred when she experienced the pain.  She denies f/c/n/v.      There have been no changes in patient's medications or medical history since last appointment (12/27/2011).      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     Comment: crush injury right foot       Current outpatient prescriptions:zolpidem (AMBIEN CR) 6.25 MG CR tablet, Take 1 tablet by mouth nightly as needed for Sleep., Disp: 30 tablet, Rfl: 0;  zolpidem (AMBIEN) 5 MG tablet, Take 1 tablet by mouth nightly as needed for Sleep., Disp: 30 tablet, Rfl: 0;  doxazosin (CARDURA) 1 MG tablet, Take 1 tablet by mouth nightly., Disp: 30 tablet, Rfl: 11;  amlodipine (NORVASC) 5 MG tablet, Take 1 tablet by mouth daily., Disp: 30 tablet, Rfl: 11  SYNTHROID 125 MCG tablet, Take 1 tablet by mouth every morning., Disp: 90 tablet, Rfl: 3;  fluticasone-salmeterol (ADVAIR DISKUS) 500-50 MCG/DOSE AEPB Aerosol Powder, Inhale 1 puff into the lungs every 12 (twelve) hours., Disp: 180 each, Rfl: 0;  albuterol (PROAIR HFA) 108 (90 BASE) MCG/ACT inhaler, Inhale 2 puffs into the lungs every 6 (six) hours as needed for Wheezing., Disp: 1 Inhaler, Rfl: 0  diazepam (VALIUM) 5 MG tablet, Take 1 tablet by mouth  every 12 (twelve) hours as needed for Anxiety (muscle spasm)., Disp: 60 tablet, Rfl: 0;  oxycodone (OXYCONTIN) 10 MG 12 hr tablet, Take 1 tablet by mouth daily., Disp: 30 tablet, Rfl: 0;  lidocaine (LIDODERM) 5 % patch, Place 1 patch onto the skin daily., Disp: 30 patch, Rfl: 3  hydrOXYzine (ATARAX) 25 MG tablet, Take 1 tablet by mouth every 8 (eight) hours as needed for Itching or Anxiety., Disp: 30 tablet, Rfl: 3;  EPINEPHrine (EPIPEN 2-PAK) 0.3 MG/0.3ML Injection Device, Inject 0.3 mg into the muscle once as needed., Disp: 1 each, Rfl: 1;  lidocaine (LIDODERM) 5 % patch, Place 1 patch onto the skin daily., Disp: 30 patch, Rfl: 4  hydrochlorothiazide (HYDRODIURIL) 25 MG tablet, Take 1 tablet by mouth daily., Disp: 90 tablet, Rfl: 4;  EXPIRED: QUEtiapine (SEROQUEL) 25 MG tablet, Take  by mouth. Take 2 tablets by mouth qhs. Then take 1 tablet bid prn anxiety., Disp: 90 tablet, Rfl: 0;  EXPIRED: furosemide (LASIX) 20 MG tablet, Take 1 tablet by mouth 2 (two) times daily., Disp: 60 tablet, Rfl: 1  EXPIRED: clindamycin-benzoyl peroxide (BENZACLIN) gel, Apply  topically 2 (two) times daily., Disp: 50 g, Rfl: 4    Review of Patient's Allergies indicates:   Oxycodone  Comment:Hives, other narcotics OK   Penicillins             Hives   Erythromycin            Nausea Only    Comment:Stomach pain severe no nausea, can take             azithromycin   Fluoxetine              Rash    ROS:  Denies headaches, dizziness.  Reports blurred vision last episode of shooting pain up into her hip.  Denies f/c/n/v.  Reports right leg pain.      Objective:  Vascular:  DP and PT pulses 2/4 bilaterally.  Capillary refill time is normal, <3 seconds bilaterally.  Examination reveals normal hair growth.  Dermatological:  Skin temperature and texture within normal limits b/l.  Orthopedic:  Manual muscle testing 5/5 bilaterally.  Neurological:  Protective sensation and vibratory sensation intact b/l.    Following sterile skin  preparation, a steroid injection was performed at right foot posterior tibial nerve consisting of 4.75 cc of 0.5% plain bupivacaine and 0.25 cc of 4 mg/ml of dexamethasone sodium phosphate.  A second injection consisting of 4.75 cc of 0.5% plain bupivacaine and 0.25 cc of 4 mg/ml of dexamethasone sodium phosphate was injected into the common peroneal nerve on right leg.  Patient tolerated injection well with no complications.        Assessment/Plan:    41 y/o female presents with CRPS of right foot secondary to crush injury.  Patient presents bi-weekly for tibial and common peroneal nerve blocks.    Patient was evaluated and treated with all questions answered.  A steroid injection was performed at right foot posterior tibial nerve consisting of 4.75 cc of 0.5% plain bupivacaine and 0.25 cc of 4 mg/ml of dexamethasone sodium phosphate.  A second injection consisting of 4.75 cc of 0.5% plain bupivacaine and 0.25 cc of 4 mg/ml of dexamethasone sodium phosphate was injected into the common peroneal nerve on right leg.  Patient tolerated injection well with no complications.  Patient will be reappointed Monday, January 03, 2012.    Patient was advised to call sooner if any problems occur.

## 2012-01-04 MED ORDER — DEXAMETHASONE SODIUM PHOSPHATE 4 MG/ML IJ SOLN
2.00 mg | Freq: Once | INTRAMUSCULAR | Status: AC
Start: 2012-01-04 — End: 2012-01-04

## 2012-01-04 NOTE — Progress Notes (Signed)
Patient was seen with resident Dr. Nodelman.  I concur with their assessment and treatment plan.

## 2012-01-06 ENCOUNTER — Ambulatory Visit (HOSPITAL_BASED_OUTPATIENT_CLINIC_OR_DEPARTMENT_OTHER): Payer: Medicaid Other | Admitting: Internal Medicine

## 2012-01-06 ENCOUNTER — Encounter (HOSPITAL_BASED_OUTPATIENT_CLINIC_OR_DEPARTMENT_OTHER): Payer: Self-pay | Admitting: Internal Medicine

## 2012-01-06 ENCOUNTER — Ambulatory Visit (HOSPITAL_BASED_OUTPATIENT_CLINIC_OR_DEPARTMENT_OTHER): Payer: Medicaid Other | Admitting: Podiatrist

## 2012-01-06 VITALS — BP 100/72 | HR 59 | Temp 97.5°F | Resp 18

## 2012-01-06 DIAGNOSIS — J45909 Unspecified asthma, uncomplicated: Secondary | ICD-10-CM

## 2012-01-06 DIAGNOSIS — F32A Depression, unspecified: Secondary | ICD-10-CM

## 2012-01-06 DIAGNOSIS — G9050 Complex regional pain syndrome I, unspecified: Secondary | ICD-10-CM

## 2012-01-06 DIAGNOSIS — I10 Essential (primary) hypertension: Secondary | ICD-10-CM

## 2012-01-06 DIAGNOSIS — F329 Major depressive disorder, single episode, unspecified: Secondary | ICD-10-CM

## 2012-01-06 DIAGNOSIS — G589 Mononeuropathy, unspecified: Secondary | ICD-10-CM

## 2012-01-06 MED ORDER — HYDROCHLOROTHIAZIDE 25 MG PO TABS
25.0000 mg | ORAL_TABLET | Freq: Every day | ORAL | Status: DC
Start: 2012-01-06 — End: 2013-03-05

## 2012-01-06 MED ORDER — DEXAMETHASONE SODIUM PHOSPHATE 4 MG/ML IJ SOLN
2.00 mg | Freq: Once | INTRAMUSCULAR | Status: AC
Start: 2012-01-06 — End: 2012-01-06

## 2012-01-06 NOTE — Progress Notes (Signed)
.  pt safe at home. OME

## 2012-01-06 NOTE — Progress Notes (Signed)
HPI:  Madison Mckay is a 41 year old female who presents for follow up of right lower extremity CRPS. At her last clinical visit in the podiatric surgical clinic standard common peroneal and tibial nerve blocks were performed. She returns today complaining of her chronic symptomatology. She has been experiencing symptoms for approximately 2 years at this time and this chronic pain syndrome is likely secondary to a crush injury she sustained to the RIGHT foot at work. She undergoes injections weekly. She has had persistence of her symptoms despite physical therapy, TENS, calcium channel blocker therapy, anticonvulsants.      LOWER EXTREMITY PHYSICAL EXAM:  GENERAL: AAOx3. No acute distress. Pleasant.  VASCULAR: dorsalis pedis and posterior tibial pulses are palpable bilaterally; capillary refill time < 3 sec x 10 toes; mild diffuse pedal edema, no varicosities.  DERMATOLOGICAL: No open lesions. Reticulated pattern of erythema present to the dorsal foot.     ASSESSMENT/PLAN:  This is a 41 year old female with RIGHT lower extremity chronic regional pain syndrome with persistence of symptoms. Treatment options were discussed with the patient. She wishes to pursue the treatments discussed at previous clinic visits (see note by Dr. Diana Eves) and by Dr. Lenor Derrick.  Standard injection of 5 mL of 0.5% marcaine and 0.25 mL dexamethasone in both the common peroneal and tibial nerves.
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
## 2012-01-06 NOTE — Progress Notes (Signed)
CC: Patient is 41 y/o who presents to clinic today for continued injection therapy CRPS of right leg. She presents for her biweekly common peroneal and tibial nerve blocks. She developed CRPS in right foot secondary to a crush injury she sustained at work. She reports a lot of pain today. She said the pain was so severe the other day that it radiated up into her hip. She is concerned because her vision was blurred when she experienced the pain. She denies f/c/n/v.     Injection consisted of 5 cc of bupivicaine with epi, and 1/4 cc dexamethasone, administered to both locations.  She tolerated the injections without complications.

## 2012-01-10 ENCOUNTER — Ambulatory Visit (HOSPITAL_BASED_OUTPATIENT_CLINIC_OR_DEPARTMENT_OTHER): Payer: Medicaid Other | Admitting: Podiatrist

## 2012-01-10 DIAGNOSIS — G9050 Complex regional pain syndrome I, unspecified: Secondary | ICD-10-CM

## 2012-01-10 MED ORDER — TAPENTADOL HCL ER 150 MG PO TB12
1.0000 | ORAL_TABLET | Freq: Four times a day (QID) | ORAL | Status: DC
Start: 2012-01-10 — End: 2012-02-03

## 2012-01-13 ENCOUNTER — Ambulatory Visit (HOSPITAL_BASED_OUTPATIENT_CLINIC_OR_DEPARTMENT_OTHER): Payer: Medicaid Other | Admitting: Foot & Ankle Surgery

## 2012-01-13 ENCOUNTER — Encounter (HOSPITAL_BASED_OUTPATIENT_CLINIC_OR_DEPARTMENT_OTHER): Payer: Self-pay | Admitting: Foot & Ankle Surgery

## 2012-01-13 DIAGNOSIS — IMO0002 Reserved for concepts with insufficient information to code with codable children: Secondary | ICD-10-CM

## 2012-01-13 MED ORDER — DEXAMETHASONE SODIUM PHOSPHATE 4 MG/ML IJ SOLN
1.00 mg | Freq: Once | INTRAMUSCULAR | Status: AC
Start: 2012-01-13 — End: 2012-01-14
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
## 2012-01-13 NOTE — Progress Notes (Signed)
41 year old female patient of Dr. Christeen Douglas with CRPS, right foot. Patient is following up for bi-weekly nerve blocks. She states she is doing well. Pain is well controlled with the injections. She continues to ambulate without difficulty in regular shoes.     ROS: Unremarkable. Pain right leg.    PMH, Meds, Allergies reviewed.     Physical Exam:  - Gen: A&Ox3, NAD  - Right LE: DP/PT pulses palpable. Gross sensation is intact. Skin coloration is wnl. No pain with light touch or ROM. No edema. No mottling of skin. No temperature changes vs. Contralateral limb.    A/P: CRPS, right foot  - Patient's name and date of birth confirmed. A posterior tibial and common peroneal nerve block were administered, each block consisting of 4.75cc 0.5% Marcaine with epinephrine and 0.25cc Dexamethasone 4mg /ml  into each area.  Patient tolerated procedure well with no complications. She will follow up next week for another block.

## 2012-01-14 ENCOUNTER — Encounter (HOSPITAL_BASED_OUTPATIENT_CLINIC_OR_DEPARTMENT_OTHER): Payer: Self-pay | Admitting: Internal Medicine

## 2012-01-14 DIAGNOSIS — I7300 Raynaud's syndrome without gangrene: Secondary | ICD-10-CM | POA: Insufficient documentation

## 2012-01-15 MED ORDER — DEXAMETHASONE SODIUM PHOSPHATE 4 MG/ML IJ SOLN
2.00 mg | Freq: Once | INTRAMUSCULAR | Status: AC
Start: 2012-01-15 — End: 2012-01-15

## 2012-01-15 NOTE — Progress Notes (Signed)
Patient was seen with resident Dr. Silverman.  I concur with their assessment and treatment plan.

## 2012-01-17 ENCOUNTER — Ambulatory Visit (HOSPITAL_BASED_OUTPATIENT_CLINIC_OR_DEPARTMENT_OTHER): Payer: Medicaid Other | Admitting: Podiatrist

## 2012-01-17 ENCOUNTER — Other Ambulatory Visit (HOSPITAL_BASED_OUTPATIENT_CLINIC_OR_DEPARTMENT_OTHER): Payer: Self-pay | Admitting: Internal Medicine

## 2012-01-17 DIAGNOSIS — M792 Neuralgia and neuritis, unspecified: Secondary | ICD-10-CM

## 2012-01-17 DIAGNOSIS — J45901 Unspecified asthma with (acute) exacerbation: Secondary | ICD-10-CM

## 2012-01-17 DIAGNOSIS — M79673 Pain in unspecified foot: Secondary | ICD-10-CM

## 2012-01-17 DIAGNOSIS — G9050 Complex regional pain syndrome I, unspecified: Secondary | ICD-10-CM

## 2012-01-17 MED ORDER — DEXAMETHASONE SODIUM PHOSPHATE 4 MG/ML IJ SOLN
2.00 mg | Freq: Once | INTRAMUSCULAR | Status: AC
Start: 2012-01-17 — End: 2012-01-17

## 2012-01-17 MED ORDER — ONE-DAILY MULTI VITAMINS PO TABS
1.0000 | ORAL_TABLET | Freq: Every day | ORAL | Status: AC
Start: 2012-01-17 — End: 2013-01-16

## 2012-01-17 MED ORDER — ALPRAZOLAM 1 MG PO TABS
1.0000 mg | ORAL_TABLET | Freq: Every evening | ORAL | Status: DC | PRN
Start: 2012-01-17 — End: 2012-02-03

## 2012-01-17 NOTE — Telephone Encounter (Signed)
Message copied by Wynelle Cleveland on Mon Jan 17, 2012  2:57 PM  ------       Message from: AMADO-LOUIS, LISA       Created: Mon Jan 17, 2012  2:36 PM       Regarding: med refill          [facility]              Person calling on behalf of patient: fax              May list multiple medications in this section              Medicine Name: daily-vite tablet               Dosage:               Frequency (how many pills, how many times a day):               Number of pills left:               Documented patient preferred pharmacies:        [facility] - [address] - [place], [place] - [address]       Phone: [phone] Fax: [phone]                     Pharmacy Name:               Pharmacy Telephone Number:               Pharmacy  Fax Number:               CALL BACK NUMBER:               Cell phone:                Other phone:               Available times:               Patient's language of care: English                              ------
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
## 2012-01-17 NOTE — Progress Notes (Signed)
[facility], Madison Mckay is a 41 year old female has requested a refill of multivitamin.      Last Office Visit: 01/06/12  Last Physical Exam: 07/06/10      Other Med Adult:  Most Recent BP Reading(s)  01/06/12 : 100/72        No results found for this basename: cholesterol    No results found for this basename: LDL    No results found for this basename: HDL    No results found for this basename: tg        No results found for this basename: TSHSC        THYROID STIMULATING HORMONE (uIU/mL)   Date  Value    07/06/2010  0.46    ----------      No results found for this basename: hgba1c        No results found for this basename: INR       Documented patient preferred pharmacies:    [facility] - [address] - [place], [place] - [address]  Phone: [phone] Fax: [phone]

## 2012-01-18 ENCOUNTER — Other Ambulatory Visit (HOSPITAL_BASED_OUTPATIENT_CLINIC_OR_DEPARTMENT_OTHER): Payer: Self-pay | Admitting: Internal Medicine

## 2012-01-18 DIAGNOSIS — J45901 Unspecified asthma with (acute) exacerbation: Secondary | ICD-10-CM

## 2012-01-18 NOTE — Progress Notes (Signed)
[facility], Madison Mckay is a 41 year old female has requested a refill of bupropion xl 300mg .      Last Office Visit: 01/06/12  Last Physical Exam: 07/06/10      Other Med Adult:  Most Recent BP Reading(s)  01/06/12 : 100/72        No results found for this basename: cholesterol    No results found for this basename: LDL    No results found for this basename: HDL    No results found for this basename: tg        No results found for this basename: TSHSC        THYROID STIMULATING HORMONE (uIU/mL)   Date  Value    07/06/2010  0.46    ----------      No results found for this basename: hgba1c        No results found for this basename: INR       Documented patient preferred pharmacies:    [facility] - [address] - [place], [place] - [address]  Phone: [phone] Fax: [phone]
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
## 2012-01-18 NOTE — Telephone Encounter (Signed)
Message copied by Wynelle Cleveland on Tue Jan 18, 2012 10:45 AM  ------       Message from: Natalia Leatherwood       Created: Tue Jan 18, 2012  9:10 AM       Regarding: Rx          [facility]              Person calling on behalf of patient: Pharmacy              May list multiple medications in this section              Medicine Name: BUPROPION HCL XL 300mg               Dosage:               Frequency (how many pills, how many times a day):               Number of pills left:               Documented patient preferred pharmacies:        [facility] - [address] - [place], [place] - [address]       Phone: [phone] Fax: [phone]                     Pharmacy Name:               Pharmacy Telephone Number:               Pharmacy  Fax Number:               CALL BACK NUMBER:               Cell phone:                Other phone:               Available times:               Patient's language of care: English              Patient needs a  interpreter.                              ------

## 2012-01-20 ENCOUNTER — Encounter (HOSPITAL_BASED_OUTPATIENT_CLINIC_OR_DEPARTMENT_OTHER): Payer: Self-pay | Admitting: Podiatrist

## 2012-01-20 ENCOUNTER — Ambulatory Visit (HOSPITAL_BASED_OUTPATIENT_CLINIC_OR_DEPARTMENT_OTHER): Payer: Medicaid Other | Admitting: Podiatrist

## 2012-01-20 DIAGNOSIS — Z7689 Persons encountering health services in other specified circumstances: Secondary | ICD-10-CM

## 2012-01-20 DIAGNOSIS — G9050 Complex regional pain syndrome I, unspecified: Secondary | ICD-10-CM

## 2012-01-20 LAB — BASIC METABOLIC PANEL
ANION GAP: 11 mmol/L (ref 3–11)
BUN (UREA NITROGEN): 9 mg/dl (ref 6–20)
CALCIUM: 9.3 mg/dl (ref 8.6–10.3)
CARBON DIOXIDE: 31 mmol/L (ref 22–32)
CHLORIDE: 100 mmol/L — ABNORMAL LOW (ref 101–111)
CREATININE: 0.8 mg/dl (ref 0.4–1.2)
ESTIMATED GLOMERULAR FILT RATE: >60 mL/min (ref >60)
Glucose Random: 74 mg/dl (ref 74–160)
POTASSIUM: 3.8 mmol/L (ref 3.5–5.1)
SODIUM: 142 mmol/L (ref 135–144)

## 2012-01-20 MED ORDER — CALCITONIN (SALMON) 200 UNIT/ACT NA SOLN
1.00 | Freq: Two times a day (BID) | NASAL | Status: AC
Start: 2012-01-20 — End: 2012-02-19

## 2012-01-20 NOTE — Progress Notes (Signed)
Patient seen for injection therapy of RIGHT lower extremity CRPS. No improvement in symptoms. She attains approximately 1-1.5 days of relief with each injection. She has failed multiple treatments. Not interested in surgical sympathectomy.     - Common peroneal + tibial nerve blocks performed today with 4.75-cc 0.5% marcaine + 1:200,000 epi + 0.25-cc dexamethasone (4mg /mL) without complication  - Will try intra-nasal calcitonin 200 units BID. Calcitonin therapy has been shown to both retard bone absorption and has an analgesic effect as well in CRPS. The optimal dose is uncertain but daily dosages have ranged from 300-400 units per day in various studies. It can be delivered through the intra-nasal route [[place]. Br J Rheumatol 1991; 30:291, [place]. Pain 1992; 48:171.]. Asymptomatic hypocalcemia can occur so will obtain baseline labs.  - f/u with Dr. Marcha Dutton

## 2012-01-24 ENCOUNTER — Encounter (HOSPITAL_BASED_OUTPATIENT_CLINIC_OR_DEPARTMENT_OTHER): Payer: Self-pay | Admitting: Foot & Ankle Surgery

## 2012-01-24 ENCOUNTER — Ambulatory Visit (HOSPITAL_BASED_OUTPATIENT_CLINIC_OR_DEPARTMENT_OTHER): Payer: Medicaid Other | Admitting: Foot & Ankle Surgery

## 2012-01-24 VITALS — BP 112/72 | HR 86 | Temp 98.4°F

## 2012-01-24 DIAGNOSIS — G9050 Complex regional pain syndrome I, unspecified: Secondary | ICD-10-CM

## 2012-01-25 ENCOUNTER — Encounter (HOSPITAL_BASED_OUTPATIENT_CLINIC_OR_DEPARTMENT_OTHER): Payer: Self-pay | Admitting: Foot & Ankle Surgery

## 2012-01-25 MED ORDER — DEXAMETHASONE SODIUM PHOSPHATE 4 MG/ML IJ SOLN
2.00 mg | Freq: Once | INTRAMUSCULAR | Status: AC
Start: 2012-01-25 — End: 2012-01-25

## 2012-01-25 NOTE — Progress Notes (Signed)
41 year old female patient of Dr. Christeen Douglas with CRPS, right foot. Patient is following up for bi-weekly nerve blocks. She states that her pain has been severe over the past few days. She has been unable to sleep for the past few days after doing a lot of walking. The pain has not been this significant in a while. The pain is worse when her ankle moves.    ROS: Pain right leg.    PMH, Meds, Allergies reviewed.     Physical Exam:  - Gen: A&Ox3, NAD  - Right LE: DP/PT pulses palpable. Gross sensation is intact. Skin coloration is slightly more purplish on the right leg today. No pain with light touch or ROM. No edema. No mottling of skin. Slight temperature increase vs. Contralateral limb.    A/P: CRPS, right foot  - Patient's name and date of birth confirmed. A posterior tibial and common peroneal nerve block were administered, each block consisting of 4.75cc 0.5% Marcaine with epinephrine and 0.25cc Dexamethasone 4mg /ml  into each area.  Patient tolerated procedure well with no complications. She will follow up later this week for another block. Dispensed night splint to reduce her ankle motion at night.

## 2012-01-26 MED ORDER — DEXAMETHASONE SODIUM PHOSPHATE 4 MG/ML IJ SOLN
2.00 mg | Freq: Once | INTRAMUSCULAR | Status: AC
Start: 2012-01-26 — End: 2012-01-26

## 2012-01-26 NOTE — Progress Notes (Signed)
Subjective:   Madison Mckay 41 year old patient who presents with CRPS. She states that she has been receiving injection 2 times a week and that they provide relief from her pain for 2-3 days. The patient last recived an injection on 01/13/12 and she states that her pain was under control till about Sunday, 01/16/12. She states that she has been having trouble sleeping because of the pain. The patient states that this has been going on for 2 years at which time she suffered a crush injury to her right foot.     Review of Patient's Allergies indicates:   Oxycodone   Comment:Hives, other narcotics OK   Penicillins Hives   Erythromycin Nausea Only   Comment:Stomach pain severe no nausea, can take   azithromycin   Fluoxetine Rash   Review of Systems:  Patient reports right lower extremity pain. She denies nausea, vomiting, fever, chills, shortness of breath.   Objective:   General: A&Ox3 and in no acute distress.   Vascular: DP and PT pulses palpable b/l. No edema noted. Cap refill <3 seconds. Normal proximal to distal cooling b/l.   Derm: Skin temperature, turgor, and texture within normal limits b/l. No skin changes noted b/l.   Ortho: Manual muscle test 5/5 b/l. Patient reports pain with palpation of entire right foot. Patient denies pain with ROM of right lower extremity.   Neuro: Gross sensation intact b/l.   Assessment:   Madison Mckay is a 41 year old patient who presents for injections for her CRPS of her right lower extremity.   Plan:   -Patient evaluated, treated, and all questions answered.   -Following consent and skin prep with alcohol, an injection of 4.75 mL of 0.5% Marcaine with epinephrine and 0.25 mL of 4 mg/ml Dexamethasone Sodium Phosphate was injected to block the posterior tibial nerve using a medial approach into the right tarsal tunnel.   -Following consent and skin prep with alcohol, an injection of 4.75 mL of 0.5% Marcaine with epinephrine and 0.25 mL of 4 mg/ml Dexamethasone Sodium  Phosphate was injected to block the common peroneal nerve using a lateral approach injecting just distal to the fibula head.

## 2012-01-27 ENCOUNTER — Ambulatory Visit (HOSPITAL_BASED_OUTPATIENT_CLINIC_OR_DEPARTMENT_OTHER): Payer: Medicaid Other | Admitting: Podiatrist

## 2012-01-27 DIAGNOSIS — G9050 Complex regional pain syndrome I, unspecified: Secondary | ICD-10-CM

## 2012-01-27 DIAGNOSIS — M79673 Pain in unspecified foot: Secondary | ICD-10-CM

## 2012-01-27 LAB — XR FOOT RIGHT MINIMUM 3 VIEWS

## 2012-01-27 MED ORDER — DEXAMETHASONE SODIUM PHOSPHATE 4 MG/ML IJ SOLN
2.00 mg | Freq: Once | INTRAMUSCULAR | Status: AC
Start: 2012-01-27 — End: 2012-01-27

## 2012-01-27 NOTE — Addendum Note (Signed)
Addended by: Raed Schalk on: 01/27/2012 11:22 AM     Modules accepted: Orders
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
## 2012-01-29 MED ORDER — DEXAMETHASONE SODIUM PHOSPHATE 4 MG/ML IJ SOLN
2.00 mg | Freq: Once | INTRAMUSCULAR | Status: AC
Start: 2012-01-29 — End: 2012-01-29

## 2012-01-29 NOTE — Progress Notes (Signed)
Patient was seen with resident Dr. Nodelman.  I concur with their assessment and treatment plan.
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
## 2012-01-31 ENCOUNTER — Ambulatory Visit (HOSPITAL_BASED_OUTPATIENT_CLINIC_OR_DEPARTMENT_OTHER): Payer: Medicaid Other | Admitting: Podiatrist

## 2012-01-31 ENCOUNTER — Other Ambulatory Visit (HOSPITAL_BASED_OUTPATIENT_CLINIC_OR_DEPARTMENT_OTHER): Payer: Self-pay | Admitting: Internal Medicine

## 2012-01-31 DIAGNOSIS — G90529 Complex regional pain syndrome I of unspecified lower limb: Secondary | ICD-10-CM

## 2012-01-31 DIAGNOSIS — J45901 Unspecified asthma with (acute) exacerbation: Secondary | ICD-10-CM
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
## 2012-01-31 NOTE — Telephone Encounter (Signed)
Message copied by Aliene Altes on Mon Jan 31, 2012  5:31 PM  ------       Message from: AMADO-LOUIS, LISA       Created: Mon Jan 31, 2012  4:27 PM       Regarding: med refill          [facility]              Person calling on behalf of patient: Patient (self)              May list multiple medications in this section              Medicine Name: bupropion 300mg               Dosage:               Frequency (how many pills, how many times a day):               Number of pills left: =              Documented patient preferred pharmacies:        [facility] - [address] - [place], [place] - [address]       Phone: [phone] Fax: [phone]                     Pharmacy Name:               Pharmacy Telephone Number:               Pharmacy  Fax Number:               CALL BACK NUMBER:               Cell phone:                Other phone:               Available times:               Patient's language of care: English              Patient needs a Spanish interpreter.                              ------

## 2012-01-31 NOTE — Progress Notes (Signed)
Person calling on behalf of patient: Patient (self)    Kateline Kinkade is a 41 year old female       - Medication(s) request: Bupropion (Wellbutrin XL) 300mg   - Last office visit: 01/06/12  - Last physical exam: N/A      Other Med Adult:  Most Recent BP Reading(s)  01/24/12 : 112/72        No results found for this basename: cholesterol    No results found for this basename: LDL    No results found for this basename: HDL    No results found for this basename: tg        No results found for this basename: TSHSC        THYROID STIMULATING HORMONE (uIU/mL)   Date  Value    07/06/2010  0.46    ----------      No results found for this basename: hgba1c        No results found for this basename: INR           Documented patient preferred pharmacies:    [facility] - [address] - [place], [place] - [address]  Phone: [phone] Fax: [phone]

## 2012-02-01 MED ORDER — BUPROPION HCL ER (XL) 300 MG PO TB24
300.0000 mg | ORAL_TABLET | Freq: Every day | ORAL | Status: DC
Start: 2012-01-31 — End: 2012-09-08

## 2012-02-03 ENCOUNTER — Ambulatory Visit (HOSPITAL_BASED_OUTPATIENT_CLINIC_OR_DEPARTMENT_OTHER): Payer: Medicaid Other | Admitting: Podiatrist

## 2012-02-03 DIAGNOSIS — G9050 Complex regional pain syndrome I, unspecified: Secondary | ICD-10-CM

## 2012-02-03 DIAGNOSIS — M79673 Pain in unspecified foot: Secondary | ICD-10-CM

## 2012-02-03 MED ORDER — TAPENTADOL HCL ER 150 MG PO TB12
1.0000 | ORAL_TABLET | Freq: Four times a day (QID) | ORAL | Status: DC
Start: 2012-02-03 — End: 2012-03-01

## 2012-02-03 MED ORDER — DEXAMETHASONE SODIUM PHOSPHATE 4 MG/ML IJ SOLN
2.00 mg | Freq: Once | INTRAMUSCULAR | Status: AC
Start: 2012-02-03 — End: 2012-02-03

## 2012-02-03 MED ORDER — ALPRAZOLAM 1 MG PO TABS
1.0000 mg | ORAL_TABLET | Freq: Every evening | ORAL | Status: DC | PRN
Start: 2012-02-03 — End: 2012-02-11
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
## 2012-02-04 MED ORDER — DEXAMETHASONE SODIUM PHOSPHATE 4 MG/ML IJ SOLN
2.00 mg | Freq: Once | INTRAMUSCULAR | Status: AC
Start: 2012-02-04 — End: 2012-02-04
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
## 2012-02-08 ENCOUNTER — Ambulatory Visit (HOSPITAL_BASED_OUTPATIENT_CLINIC_OR_DEPARTMENT_OTHER): Payer: Medicaid Other | Admitting: Primary Podiatric Medicine

## 2012-02-08 ENCOUNTER — Ambulatory Visit (HOSPITAL_BASED_OUTPATIENT_CLINIC_OR_DEPARTMENT_OTHER): Payer: PRIVATE HEALTH INSURANCE | Admitting: Podiatrist

## 2012-02-08 DIAGNOSIS — M79673 Pain in unspecified foot: Secondary | ICD-10-CM

## 2012-02-08 DIAGNOSIS — G9050 Complex regional pain syndrome I, unspecified: Secondary | ICD-10-CM

## 2012-02-08 NOTE — Progress Notes (Signed)
S: [name] returns to clinic for treatment of her right lower extremity CRPS which has been present for about 2 years. She has undergone several different treatment modalities including physical therapy and pharmacological interventions and uses a TENS unit at home. She is wary of going to a pain medical specialist and has been receiving biweekly local anesthetic injections with steroid. She states she had a bad week and states her pain is quite significant today. She has been wearing the CAM boot but states it is rubbing on her leg and is painful and she is unable to pad it adequately.    ROS: No further complaints today. She did resume cardura.     Past medical history, surgical history, medications and social history reviewed.    EXAM:  Gen: NAD, A&Ox3  RIGHT foot: Dorsalis pedis and posterior tibial pulses are palpable. There is no edema of the lower extremity or discoloration. No open lesions or decrease in temperature.    ASSESSMENT: 41 year old female with Right lower extremity CRPS for the past 2 years since a traumatic injury to the foot.    PLAN:  - Discussed treatment options with the patient including referral to physiatry for injection and implantable stimulator units however she is a bit wary. Explained that chronic nerve blocks are not the best modality for her treatment but she continues to feel that they provide her with some relief.   - Injected a 10 cc mix of 2 cc Lidocaine 1% +epinephrine, 2 cc dexamethasone phosphate and 8 cc 0.5% marcaine as a peripheral nerve block of the common peroneal, superficial peroneal, deep peroneal and tibial nerve  - Adjusted CAM boot and dispensed ACE wraps for padding  - Patient to follow up with Dr. Rosilyn Mings next week.

## 2012-02-11 ENCOUNTER — Ambulatory Visit (HOSPITAL_BASED_OUTPATIENT_CLINIC_OR_DEPARTMENT_OTHER): Payer: Medicaid Other | Admitting: Primary Podiatric Medicine

## 2012-02-11 ENCOUNTER — Ambulatory Visit (HOSPITAL_BASED_OUTPATIENT_CLINIC_OR_DEPARTMENT_OTHER): Payer: Medicaid Other | Admitting: Podiatrist

## 2012-02-11 ENCOUNTER — Encounter (HOSPITAL_BASED_OUTPATIENT_CLINIC_OR_DEPARTMENT_OTHER): Payer: Self-pay | Admitting: Podiatrist

## 2012-02-11 DIAGNOSIS — J45901 Unspecified asthma with (acute) exacerbation: Secondary | ICD-10-CM

## 2012-02-11 DIAGNOSIS — G9050 Complex regional pain syndrome I, unspecified: Secondary | ICD-10-CM

## 2012-02-11 MED ORDER — ALPRAZOLAM 1 MG PO TABS
1.5000 mg | ORAL_TABLET | Freq: Every evening | ORAL | Status: DC | PRN
Start: 2012-02-11 — End: 2012-04-03

## 2012-02-11 MED ORDER — OXYCODONE HCL ER 10 MG PO TB12
10.0000 mg | ORAL_TABLET | Freq: Every day | ORAL | Status: DC
Start: 2012-02-11 — End: 2012-04-03

## 2012-02-11 MED ORDER — HYDROXYZINE HCL 25 MG PO TABS
25.0000 mg | ORAL_TABLET | Freq: Three times a day (TID) | ORAL | Status: DC | PRN
Start: 2012-02-11 — End: 2012-03-08

## 2012-02-11 NOTE — Progress Notes (Signed)
Subjective: Madison Mckay is a 41 y/o F pt. who presents to the clinic today for continued treatment of RSD.  She statest that she has severe pain in the R. Forefoot that is only helped by serial injections.  She states that her last injections given by Dr. Ferd Hibbs only helped for 1 day because he tried a different technique than normal.  She would like the normal block today, and states that she also needs new prescriptions for Oxycodone, Xanax, and Vistaril.    Objective: Vascular status is intact with 2/4 DP and PT pulses today on the right.  Capillary refill is less than 3 seconds.  R forefoot/midfoot is cool to the touch and mottled red/purple in appearance.  There are no open lesions noted.  Orthopedic examination reveals pain on palpation to the R foot dorsally over the metatarsals and plantarly.  MMT is 5/5 in DF/PF/eversion/inversion.  Protective sensation is intact to the R foot.    Assessment: Pt. Is a returning pt. 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    Plan:  Pt. Evaluated and treated.  Following verbal consent and sterile preparation, 4cc of .5% marcaine w/epi and .25mg  Dexamethasone was injected about the common peroneal nerve by the fibular head.  Similarly, 4cc of .5% marcaine w/epi and .25cc Dexamethasone was injected about the posterior tibial nerve at the level of the medial malleolus.  5cc of .5% marcaine plain was then injected about the anterior ankle in ankle block fashion.  There were no incidents and the pt. Tolerated the procedure well.  New Rx for hydroxyzine, alprazolam, and oxycodone 10mg  were given and she was instructed how to take each medication.  All questions and concerns answered.

## 2012-02-13 MED ORDER — DEXAMETHASONE SODIUM PHOSPHATE 4 MG/ML IJ SOLN
2.00 mg | Freq: Once | INTRAMUSCULAR | Status: AC
Start: 2012-02-13 — End: 2012-02-13

## 2012-02-13 NOTE — Progress Notes (Signed)
Subjective: [name] is a 40 y/o F pt. who presents to the clinic today for continued treatment of RSD.  She statest that she has severe pain in the R. Forefoot that is only helped by serial injections.  She states that her last injections given by Dr. Theodoulou only helped for 1 day because he tried a different technique than normal.  She would like the normal block today, and states that she also needs new prescriptions for Oxycodone, Xanax, and Vistaril.    Objective: Vascular status is intact with 2/4 DP and PT pulses today on the right.  Capillary refill is less than 3 seconds.  R forefoot/midfoot is cool to the touch and mottled red/purple in appearance.  There are no open lesions noted.  Orthopedic examination reveals pain on palpation to the R foot dorsally over the metatarsals and plantarly.  MMT is 5/5 in DF/PF/eversion/inversion.  Protective sensation is intact to the R foot.    Assessment: Pt. Is a returning pt. 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    Plan:  Pt. Evaluated and treated.  Following verbal consent and sterile preparation, 4cc of .5% marcaine w/epi and .25mg Dexamethasone was injected about the common peroneal nerve by the fibular head.  Similarly, 4cc of .5% marcaine w/epi and .25cc Dexamethasone was injected about the posterior tibial nerve at the level of the medial malleolus.  5cc of .5% marcaine plain was then injected about the anterior ankle in ankle block fashion.  There were no incidents and the pt. Tolerated the procedure well.  New Rx for hydroxyzine, alprazolam, and oxycodone 10mg were given and she was instructed how to take each medication.  All questions and concerns answered.

## 2012-02-14 ENCOUNTER — Encounter (HOSPITAL_BASED_OUTPATIENT_CLINIC_OR_DEPARTMENT_OTHER): Payer: Self-pay | Admitting: Podiatrist

## 2012-02-14 ENCOUNTER — Ambulatory Visit (HOSPITAL_BASED_OUTPATIENT_CLINIC_OR_DEPARTMENT_OTHER): Payer: Medicaid Other | Admitting: Podiatrist

## 2012-02-14 VITALS — BP 138/90 | HR 78 | Temp 98.8°F

## 2012-02-14 DIAGNOSIS — G9050 Complex regional pain syndrome I, unspecified: Secondary | ICD-10-CM

## 2012-02-14 NOTE — Progress Notes (Signed)
Subjective: [name] is a 41 y/o F pt. who presents to the clinic today for continued treatment of RSD. She statest that she has severe pain in the Right forefoot that is only helped by serial injections. She would like the normal block today.  Objective: Vascular status is intact with 2/4 DP and PT pulses today on the right. Capillary refill is less than 3 seconds. R forefoot/midfoot is cool to the touch and mottled red/purple in appearance. There are no open lesions noted. Orthopedic examination reveals pain on palpation to the R foot dorsally over the metatarsals and plantarly. MMT is 5/5 in DF/PF/eversion/inversion. Protective sensation is intact to the R foot.   Assessment: Pt. Is a returning pt. 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   Plan: Pt. Evaluated and treated. Following verbal consent and sterile preparation, 4cc of .5% marcaine w/epi and .25mg  Dexamethasone was injected about the common peroneal nerve by the fibular head. Similarly, 4cc of .5% marcaine w/epi and .25cc Dexamethasone was injected about the posterior tibial nerve at the level of the medial malleolus. 5cc of .5% marcaine plain was then injected about the anterior ankle in ankle block fashion. There were no incidents and the pt. Tolerated the procedure well.  All questions and concerns answered.

## 2012-02-17 ENCOUNTER — Ambulatory Visit (HOSPITAL_BASED_OUTPATIENT_CLINIC_OR_DEPARTMENT_OTHER): Payer: Medicaid Other | Admitting: Podiatrist

## 2012-02-17 ENCOUNTER — Encounter (HOSPITAL_BASED_OUTPATIENT_CLINIC_OR_DEPARTMENT_OTHER): Payer: Self-pay | Admitting: Podiatrist

## 2012-02-17 DIAGNOSIS — G9050 Complex regional pain syndrome I, unspecified: Secondary | ICD-10-CM

## 2012-02-17 NOTE — Progress Notes (Signed)
This office note has been dictated. Account number [id number]

## 2012-02-17 NOTE — Progress Notes (Signed)
S: 40y/o female presents for continued treatment of right RSD that occurred two years ago after a crush injury. Patient states that she has continued 8/10 pain in the right forefoot that is alleviated by injection treatment. Patient would like to continue injection therapy today. Patient denies fevers, chills, nausea, vomiting, short of breath and other constitutional symptoms.   O: Vascular: DP/PT 2/4 bilaterally. CRT <3 seconds bilaterally. Temperature from knees to toes is normal bilaterally.   Dermatologic: Normal turgor, texture and color bilaterally. No open skin lesions or infections noted.   Orthopaedic: Pain on palpation to the right midfoot dorsal and plantarly. 5/5 muscle strength in all four quadrants.   Neurological: Protective sensation intact with SW 5.07 monofilament. Gross sensation intact.   A/P: 40y/o female presents for continued treatment of chronic right RSD secondary to crush injury that occurred two years ago   -Patient examined, evaluated and all questions answered   -Following sterile preparation of the skin, an injection consisting of 4mL 0.5% Marcaine with epinephrine and 0.25mg Dexamethasone to the common peroneal nerve by the fibular head. 4mL of 0.5% Marcaine with epinephrine and 0.25mL Dexamethasone to the posterior tibial nerve at the level of the medial malleous. 5mL of 0.5% Marcaine plain injected to the anterior ankle in ankle block fashion.  -The patient tolerated the injections well without complications  -Patient reappointed for two weeks and advised to call sooner should problems arise

## 2012-02-17 NOTE — Progress Notes (Signed)
Date of Service: 02/08/2012    PROGRESS UPDATE    SUBJECTIVE:  The patient is seen in the absence of my colleague, Dr. Rosilyn Mings, who regularly treats this patient.  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  This has been present for approximately 2 years.  Over these past 2 years, she has undergone multiple treatment modalities to include physiotherapy medical by pharmacologic intervention, transelectric nerve stimulation.  She has also had repeated distal nerve blocks.  She has been functioning throughout this time in an immobilization boot.  Her pain remains quite unrelenting.  She is seen today to have repeat nerve blocks performed.    REVIEW OF SYSTEMS:  The patient has no acute systemic complaints other than her right lower extremity pain.    PHYSICAL EXAMINATION:  GENERAL:  This is an alert, oriented times three, 41 year old female.  She presents in no apparent distress.  She is of good affect.  DISTAL EXTREMITY EVALUATION:  The patient has well-palpated distal pulses bilateral.  I do not appreciate any asymmetry in color, texture, and swelling to the lower extremities.  The patient suggests hyperesthesia and pain to the right foot, which is generalized.  She does demonstrate some allodynia with light touch.  I appreciate no gross skeletal derangement.    TESTS:  I did review previous radiographs of the foot, which were obtained less than 2 weeks ago.  Bone quality and density appears to be within normal for the patient's age and sex.  I do not appreciate any notable abnormalities.  There is no increase in soft tissue contours.  There is anatomic alignment of the foot.    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    PLAN:  I did have a lengthy discussion with the patient  regarding her presentation.  I did explain the difficulty in managing these conditions and further the unique nature that it presents to each individual.  To date, her only temporary relief are with these distal extremity nerve blocks, which she would like to have performed again today.  I see no contraindication to this, but I did explain that this may not provide anything but temporary relief and not advance any resolution.  More definitively, she may wish to seek out management by a pain management physician, a physiatrist, who could perform more proximal sympathetic blocks, and even potentially discuss use of implanted nerve stimulators.  Regardless, we did go ahead and proceed with distal nerve blocks today of the common peroneal, superficial peroneal, deep peroneal, and tibial nerve.  This was done with a solution of 10 mL consisting of 8 mL of 2% Xylocaine with epinephrine and 2 mL of dexamethasone phosphate 4 mg/mL.  The injections were well tolerated.  She has been provided an elastic compression wrap and returned to her controlled ankle mobility boot.  She will reschedule with my colleague, Dr. Rosilyn Mings, in 1 week.    ___________________________  Reviewed and Electronically Signed By: Rochele Pages DPM  Sig Date: 02/17/2012  Sig Time: 14:57:06  Dictated By: Rochele Pages DPM  Dict Date: 02/17/2012 Dict Time: 02 02 PM    Dictation Date and Time:02/17/2012 14:02:32  Transcription Date and Time:02/17/2012 14:33:10  eScription Dictation id: [id number] Confirmation # :[id number]      cc: [facility]  DPM; Mitchell Heir MD    DICTATED BYRochele Pages DPMMICHAEL H Asia Favata DPMD:02/17/2012 14:02:32 T:02/17/2012 14:33:10 2N Job#: [id number]

## 2012-02-20 MED ORDER — DEXAMETHASONE SODIUM PHOSPHATE 4 MG/ML IJ SOLN
2.00 mg | Freq: Once | INTRAMUSCULAR | Status: AC
Start: 2012-02-20 — End: 2012-02-20

## 2012-02-20 NOTE — Progress Notes (Signed)
S: 40y/o female presents for continued treatment of right RSD that occurred two years ago after a crush injury. Patient states that she has continued 8/10 pain in the right forefoot that is alleviated by injection treatment. Patient would like to continue injection therapy today. Patient denies fevers, chills, nausea, vomiting, short of breath and other constitutional symptoms.   O: Vascular: DP/PT 2/4 bilaterally. CRT <3 seconds bilaterally. Temperature from knees to toes is normal bilaterally.   Dermatologic: Normal turgor, texture and color bilaterally. No open skin lesions or infections noted.   Orthopaedic: Pain on palpation to the right midfoot dorsal and plantarly. 5/5 muscle strength in all four quadrants.   Neurological: Protective sensation intact with SW 5.07 monofilament. Gross sensation intact.   A/P: 40y/o female presents for continued treatment of chronic right RSD secondary to crush injury that occurred two years ago   -Patient examined, evaluated and all questions answered   -Following sterile preparation of the skin, an injection consisting of 4mL 0.5% Marcaine with epinephrine and 0.25mg Dexamethasone to the common peroneal nerve by the fibular head. 4mL of 0.5% Marcaine with epinephrine and 0.25mL Dexamethasone to the posterior tibial nerve at the level of the medial malleous. 5mL of 0.5% Marcaine plain injected to the anterior ankle in ankle block fashion.  -The patient tolerated the injections well without complications  -Patient reappointed for two weeks and advised to call sooner should problems arise

## 2012-02-21 ENCOUNTER — Ambulatory Visit (HOSPITAL_BASED_OUTPATIENT_CLINIC_OR_DEPARTMENT_OTHER): Payer: PRIVATE HEALTH INSURANCE | Admitting: Podiatrist

## 2012-02-21 DIAGNOSIS — G9050 Complex regional pain syndrome I, unspecified: Secondary | ICD-10-CM

## 2012-02-21 DIAGNOSIS — M792 Neuralgia and neuritis, unspecified: Secondary | ICD-10-CM

## 2012-02-21 MED ORDER — DICLOFENAC SODIUM 1 % TD GEL
2.00 g | Freq: Four times a day (QID) | TRANSDERMAL | Status: AC
Start: 2012-02-21 — End: 2012-03-06

## 2012-02-21 MED ORDER — DEXAMETHASONE SODIUM PHOSPHATE 4 MG/ML IJ SOLN
2.00 mg | Freq: Once | INTRAMUSCULAR | Status: AC
Start: 2012-02-21 — End: 2012-02-21

## 2012-02-21 NOTE — Progress Notes (Signed)
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  She continues to get some degree of relief with common peroneal, posterior tibial, and ankle blocks, all administered with 5 mL of bupivacaine with epinephrine and 0.25 mL dexamethasone.      PLAN:  In addition, today, we will give her a new prescription for Voltaren gel to see if we can decrease her dependence on the injections, and we will have her follow up with [place] on a p.r.n. basis later this week.    ___________________________  Reviewed and Electronically Signed By: Naoma Diener DPM  Sig Date: 03/07/2012  Sig Time: 20:27:21  Dictated By: Naoma Diener DPM  Dict Date: 02/21/2012 Dict Time: 01 53 PM    Dictation Date and Time:02/21/2012 13:53:24  Transcription Date and Time:02/21/2012 19:25:41  eScription Dictation id: [id number] Confirmation # :[id number]      DICTATED BY: Tranesha Lessner S [facility] DPMADAM S [facility] DPMD:02/21/2012 13:53:24 T:02/21/2012 19:25:41 NN Job#: [id number]

## 2012-02-23 ENCOUNTER — Encounter (HOSPITAL_BASED_OUTPATIENT_CLINIC_OR_DEPARTMENT_OTHER): Payer: Self-pay | Admitting: Podiatrist

## 2012-02-23 ENCOUNTER — Ambulatory Visit (HOSPITAL_BASED_OUTPATIENT_CLINIC_OR_DEPARTMENT_OTHER): Payer: PRIVATE HEALTH INSURANCE | Admitting: Podiatrist

## 2012-02-23 DIAGNOSIS — S9030XA Contusion of unspecified foot, initial encounter: Secondary | ICD-10-CM

## 2012-02-23 DIAGNOSIS — G9050 Complex regional pain syndrome I, unspecified: Secondary | ICD-10-CM

## 2012-02-25 ENCOUNTER — Encounter (HOSPITAL_BASED_OUTPATIENT_CLINIC_OR_DEPARTMENT_OTHER): Payer: Self-pay | Admitting: Podiatrist

## 2012-02-25 ENCOUNTER — Ambulatory Visit (HOSPITAL_BASED_OUTPATIENT_CLINIC_OR_DEPARTMENT_OTHER): Payer: PRIVATE HEALTH INSURANCE | Admitting: Podiatrist

## 2012-02-25 DIAGNOSIS — G9050 Complex regional pain syndrome I, unspecified: Secondary | ICD-10-CM

## 2012-02-25 DIAGNOSIS — S9030XA Contusion of unspecified foot, initial encounter: Secondary | ICD-10-CM

## 2012-02-28 ENCOUNTER — Ambulatory Visit (HOSPITAL_BASED_OUTPATIENT_CLINIC_OR_DEPARTMENT_OTHER): Payer: PRIVATE HEALTH INSURANCE | Admitting: Podiatrist

## 2012-02-28 ENCOUNTER — Ambulatory Visit (HOSPITAL_BASED_OUTPATIENT_CLINIC_OR_DEPARTMENT_OTHER): Payer: Medicaid Other | Admitting: Internal Medicine

## 2012-02-28 DIAGNOSIS — M792 Neuralgia and neuritis, unspecified: Secondary | ICD-10-CM

## 2012-02-28 DIAGNOSIS — G9050 Complex regional pain syndrome I, unspecified: Secondary | ICD-10-CM

## 2012-02-28 NOTE — Progress Notes (Signed)
S: 40y/o female presents for continued treatment of right RSD that occurred two years ago after a crush injury. Patient states that she has continued 8/10 pain in the right forefoot that is alleviated by injection treatment. Patient would like to continue injection therapy today. Patient denies fevers, chills, nausea, vomiting, short of breath and other constitutional symptoms.   O: Vascular: DP/PT 2/4 bilaterally. CRT <3 seconds bilaterally. Temperature from knees to toes is normal bilaterally.   Dermatologic: Normal turgor, texture and color bilaterally. No open skin lesions or infections noted.   Orthopaedic: Pain on palpation to the right midfoot dorsal and plantarly. 5/5 muscle strength in all four quadrants.   Neurological: Protective sensation intact with SW 5.07 monofilament. Gross sensation intact.   A/P: 40y/o female presents for continued treatment of chronic right RSD secondary to crush injury that occurred two years ago   -Patient examined, evaluated and all questions answered   -Following sterile preparation of the skin, an injection consisting of 4mL 0.5% Marcaine with epinephrine and 0.25mg  Dexamethasone to the common peroneal nerve by the fibular head. 4mL of 0.5% Marcaine with epinephrine and 0.71mL Dexamethasone to the posterior tibial nerve at the level of the medial malleous. 5mL of 0.5% Marcaine plain injected to the anterior ankle in ankle block fashion.  -The patient tolerated the injections well without complications  -Patient reappointed for two weeks and advised to call sooner should problems arise

## 2012-03-01 ENCOUNTER — Ambulatory Visit (HOSPITAL_BASED_OUTPATIENT_CLINIC_OR_DEPARTMENT_OTHER): Payer: Medicaid Other | Admitting: Podiatrist

## 2012-03-01 ENCOUNTER — Encounter (HOSPITAL_BASED_OUTPATIENT_CLINIC_OR_DEPARTMENT_OTHER): Payer: Self-pay | Admitting: Podiatrist

## 2012-03-01 DIAGNOSIS — G9050 Complex regional pain syndrome I, unspecified: Secondary | ICD-10-CM

## 2012-03-01 MED ORDER — TAPENTADOL HCL ER 150 MG PO TB12
1.0000 | ORAL_TABLET | Freq: Four times a day (QID) | ORAL | Status: DC
Start: 2012-03-01 — End: 2012-04-03

## 2012-03-03 ENCOUNTER — Encounter (HOSPITAL_BASED_OUTPATIENT_CLINIC_OR_DEPARTMENT_OTHER): Payer: Self-pay | Admitting: Podiatrist

## 2012-03-03 ENCOUNTER — Ambulatory Visit (HOSPITAL_BASED_OUTPATIENT_CLINIC_OR_DEPARTMENT_OTHER): Payer: PRIVATE HEALTH INSURANCE | Admitting: Podiatrist

## 2012-03-03 DIAGNOSIS — G9050 Complex regional pain syndrome I, unspecified: Secondary | ICD-10-CM

## 2012-03-04 NOTE — Progress Notes (Signed)
S: 40y/o female presents for continued treatment of right RSD that occurred two years ago after a crush injury. Patient states that she has continued 8/10 pain in the right forefoot that is alleviated by injection treatment. Patient would like to continue injection therapy today. Patient denies fevers, chills, nausea, vomiting, short of breath and other constitutional symptoms.   O: Vascular: DP/PT 2/4 bilaterally. CRT <3 seconds bilaterally. Temperature from knees to toes is normal bilaterally.   Dermatologic: Normal turgor, texture and color bilaterally. No open skin lesions or infections noted.   Orthopaedic: Pain on palpation to the right midfoot dorsal and plantarly. 5/5 muscle strength in all four quadrants.   Neurological: Protective sensation intact with SW 5.07 monofilament. Gross sensation intact.   A/P: 40y/o female presents for continued treatment of chronic right RSD secondary to crush injury that occurred two years ago   -Patient examined, evaluated and all questions answered   -Following sterile preparation of the skin, an injection consisting of 4mL 0.5% Marcaine with epinephrine and 0.25mg Dexamethasone to the common peroneal nerve by the fibular head. 4mL of 0.5% Marcaine with epinephrine and 0.25mL Dexamethasone to the posterior tibial nerve at the level of the medial malleous. 5mL of 0.5% Marcaine plain injected to the anterior ankle in ankle block fashion.  -The patient tolerated the injections well without complications  -Patient reappointed for two weeks and advised to call sooner should problems arise

## 2012-03-05 MED ORDER — DEXAMETHASONE SODIUM PHOSPHATE 4 MG/ML IJ SOLN
2.00 mg | Freq: Once | INTRAMUSCULAR | Status: AC
Start: 2012-03-05 — End: 2012-03-05

## 2012-03-05 MED ORDER — DEXAMETHASONE SODIUM PHOSPHATE 4 MG/ML IJ SOLN
2.00 mg | Freq: Once | INTRAMUSCULAR | Status: AC
Start: 2012-03-06 — End: 2012-03-06

## 2012-03-05 NOTE — Progress Notes (Signed)
This office note has been dictated. Account number [id number]    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     Comment: crush injury right foot

## 2012-03-05 NOTE — Progress Notes (Signed)
This office note has been dictated. Account number [id number]

## 2012-03-05 NOTE — Progress Notes (Signed)
This office note has been dictated. Account number [id number]

## 2012-03-05 NOTE — Progress Notes (Signed)
Date of Service: 02/23/2012    SUBJECTIVE:  The patient presents to the office today for followup for severe pain associated with reflex sympathetic dystrophy.  Today, we have performed 3 nerve conduction blocks at the common peroneal, posterior tibial, and a full ankle block, all utilizing 4.5 mL of bupivacaine with epinephrine and 0.25 mL of dexamethasone 4 mg/mL.  She tolerated the injections well without any complications noted.  We will have her return to clinic in approximately 2 days to repeat the injections.    ___________________________  Reviewed and Electronically Signed By: Naoma Diener DPM  Sig Date: 03/07/2012  Sig Time: 20:28:10  Dictated By: Naoma Diener DPM  Dict Date: 03/05/2012 Dict Time: 03 46 PM    Dictation Date and Time:03/05/2012 15:46:53  Transcription Date and Time:03/05/2012 19:14:58  eScription Dictation id: [id number] Confirmation # :[id number]      DICTATED BY: Pearlee Arvizu S [facility] DPMADAM S [facility] DPMD:03/05/2012 15:46:53 T:03/05/2012 19:14:58 JN Job#: [id number]

## 2012-03-05 NOTE — Progress Notes (Signed)
Pt returns for nerve blocks for her RSD, and renewal of Nucynta prescription.  She continues to remain stable with minimal pain since beginning triple nerve blocks.      Today, we gave common peroneal, posterior tibial and ankle nerve blocks with bupivicaine with epi and dexamethasone to each site.

## 2012-03-06 ENCOUNTER — Ambulatory Visit (HOSPITAL_BASED_OUTPATIENT_CLINIC_OR_DEPARTMENT_OTHER): Payer: PRIVATE HEALTH INSURANCE | Admitting: Podiatrist

## 2012-03-06 DIAGNOSIS — G9050 Complex regional pain syndrome I, unspecified: Secondary | ICD-10-CM

## 2012-03-06 DIAGNOSIS — S9030XA Contusion of unspecified foot, initial encounter: Secondary | ICD-10-CM

## 2012-03-06 MED ORDER — DEXAMETHASONE SODIUM PHOSPHATE 4 MG/ML IJ SOLN
2.00 mg | Freq: Once | INTRAMUSCULAR | Status: AC
Start: 2012-03-06 — End: 2012-03-06

## 2012-03-06 NOTE — Progress Notes (Signed)
This office note has been dictated. Account number [id number]

## 2012-03-06 NOTE — Progress Notes (Signed)
Date of Service: 02/25/2012    SUBJECTIVE:  The patient presents to the office today with a chief concern of pain in her right foot secondary to a contusion, which we believe caused reflex sympathetic dystrophy.  She indicates that since we have switched to the 3 block regimen 3 times a week she has had considerable improvement in her symptoms.    PLAN:  For her to continue on with the blocks until they are no longer necessary.  The common peroneal, posterior tibial, and ankle blocks were all administered utilizing a total of 4.5 mL of bupivacaine with epinephrine and 0.25 mL of dexamethasone 4 mg/mL, at each site.  She tolerated the injections well without any complications noted.  We will follow up with her in approximately 2 days.    ___________________________  Reviewed and Electronically Signed By: Naoma Diener DPM  Sig Date: 03/07/2012  Sig Time: 20:28:14  Dictated By: Naoma Diener DPM  Dict Date: 03/05/2012 Dict Time: 10 46 PM    Dictation Date and Time:03/05/2012 22:46:31  Transcription Date and Time:03/06/2012 07:39:37  eScription Dictation id: [id number] Confirmation # :[id number]      DICTATED BY: Konnar Ben S Ramel Tobon DPMADAM S [facility] DPMD:03/05/2012 22:46:31 T:03/06/2012 07:39:37 *N Job#: [id number]

## 2012-03-06 NOTE — Progress Notes (Signed)
Date of Service: 03/06/2012    SUBJECTIVE:  The patient presents to the office today for followup for reflex sympathetic dystrophy.  She continues to have relief with nerve blocks performed every other day.      PROCEDURE:  Today we will perform a common peroneal, posterior tibial and ankle block, all with 4.5 mL bupivacaine with epinephrine and 0.25 mL dexamethasone 4 mg per mL.  She tolerated all 3 injections well without any complications noted.  She indicates she has begun walking for exercise as well, and this appears to be tolerable.  She has worn out her last compression stockings and we have provided her with a new pair of compression stockings, size medium below the knee.      PLAN:  Is for her to follow up with [place] in 2 days.    ___________________________  Reviewed and Electronically Signed By: Naoma Diener DPM  Sig Date: 03/07/2012  Sig Time: 20:29:04  Dictated By: Naoma Diener DPM  Dict Date: 03/06/2012 Dict Time: 12 02 PM    Dictation Date and Time:03/06/2012 12:02:11  Transcription Date and Time:03/06/2012 15:09:48  eScription Dictation id: [id number] Confirmation # :[id number]      DICTATED BY: Sherisse Fullilove S Karis Rilling DPMADAM S Antinette Keough DPMD:03/06/2012 12:02:11 T:03/06/2012 15:09:48 DN Job#: [id number]

## 2012-03-06 NOTE — Progress Notes (Signed)
Date of Service: 03/03/2012    SUBJECTIVE:  The patient presents to the office today for followup for reflex sympathetic dystrophy with pain in her right foot and leg.  She continues to show a steady pattern of comfort following the triple nerve block performed every 2-3 days.      Today, we will again perform the nerve block up to the common peroneal, posterior tibial, and ankle of the right foot.  Each injection consisted of 4.5 mL of bupivacaine with epinephrine and a quarter of a mL of dexamethasone 4 mg/mL.  She tolerated all 3 injections well without any complications noted.  There is no evidence of infection at this time.      Follow up in approximately 2 days.    ___________________________  Reviewed and Electronically Signed By: Naoma Diener DPM  Sig Date: 03/07/2012  Sig Time: 20:28:28  Dictated By: Naoma Diener DPM  Dict Date: 03/05/2012 Dict Time: 11 15 PM    Dictation Date and Time:03/05/2012 23:15:23  Transcription Date and Time:03/06/2012 07:58:31  eScription Dictation id: [id number] Confirmation # :[id number]      DICTATED BY: Lilla Callejo S Nirvaan Frett DPMADAM S [facility] DPMD:03/05/2012 23:15:23 T:03/06/2012 07:58:31 *N Job#: [id number]

## 2012-03-06 NOTE — Progress Notes (Signed)
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  She continues to report that she is much more able to tolerate the symptoms when she gets injections 3 times a week.  She indicates that her use of narcotics has decreased since beginning the more frequent injections.    PAST MEDICAL HISTORY:  Reviewed and is otherwise unchanged since her last visit.    PLAN:  Today we have administered 3 nerve conduction blocks to her common peroneal, her posterior tibial and her right ankle.  Each injection consisted of 4.5 mL of bupivacaine with epinephrine and a 0.25 mL of dexamethasone 4 mg per mL.  She tolerated all 3 injections well without any complications noted.  Plan is for her to follow up with [place] in approximately 2 days.    ___________________________  Reviewed and Electronically Signed By: Naoma Diener DPM  Sig Date: 03/07/2012  Sig Time: 20:28:26  Dictated By: Naoma Diener DPM  Dict Date: 03/05/2012 Dict Time: 11 12 PM    Dictation Date and Time:03/05/2012 23:12:04  Transcription Date and Time:03/06/2012 07:56:54  eScription Dictation id: [id number] Confirmation # :[id number]      DICTATED BY: Lashonne Shull S Dailey Buccheri DPMADAM S Skiler Olden DPMD:03/05/2012 23:12:04 T:03/06/2012 07:56:54 *N Job#: [id number]

## 2012-03-07 ENCOUNTER — Encounter (HOSPITAL_BASED_OUTPATIENT_CLINIC_OR_DEPARTMENT_OTHER): Payer: Self-pay

## 2012-03-08 ENCOUNTER — Telehealth (HOSPITAL_BASED_OUTPATIENT_CLINIC_OR_DEPARTMENT_OTHER): Payer: Self-pay

## 2012-03-08 ENCOUNTER — Ambulatory Visit (HOSPITAL_BASED_OUTPATIENT_CLINIC_OR_DEPARTMENT_OTHER): Payer: Self-pay | Admitting: Podiatrist

## 2012-03-08 ENCOUNTER — Ambulatory Visit (HOSPITAL_BASED_OUTPATIENT_CLINIC_OR_DEPARTMENT_OTHER): Payer: PRIVATE HEALTH INSURANCE | Admitting: Podiatrist

## 2012-03-08 ENCOUNTER — Encounter (HOSPITAL_BASED_OUTPATIENT_CLINIC_OR_DEPARTMENT_OTHER): Payer: Self-pay | Admitting: Podiatrist

## 2012-03-08 DIAGNOSIS — G9050 Complex regional pain syndrome I, unspecified: Secondary | ICD-10-CM

## 2012-03-08 DIAGNOSIS — S9030XA Contusion of unspecified foot, initial encounter: Secondary | ICD-10-CM

## 2012-03-08 DIAGNOSIS — M792 Neuralgia and neuritis, unspecified: Secondary | ICD-10-CM

## 2012-03-08 MED ORDER — ZOLPIDEM TARTRATE ER 6.25 MG PO TBCR
6.2500 mg | EXTENDED_RELEASE_TABLET | Freq: Every evening | ORAL | Status: DC | PRN
Start: 2012-03-08 — End: 2012-04-03

## 2012-03-08 MED ORDER — ZOLPIDEM TARTRATE 5 MG PO TABS
5.0000 mg | ORAL_TABLET | Freq: Every evening | ORAL | Status: AC | PRN
Start: 2012-03-08 — End: 2012-04-07

## 2012-03-08 MED ORDER — CALCITONIN (SALMON) 200 UNIT/ACT NA SOLN
1.00 | Freq: Two times a day (BID) | NASAL | Status: AC
Start: 2012-03-08 — End: 2012-04-07

## 2012-03-08 MED ORDER — UREA 40 % EX OINT
TOPICAL_OINTMENT | CUTANEOUS | Status: AC | PRN
Start: 2012-03-08 — End: 2012-04-07

## 2012-03-08 MED ORDER — HYDROXYZINE HCL 25 MG PO TABS
25.0000 mg | ORAL_TABLET | Freq: Three times a day (TID) | ORAL | Status: DC | PRN
Start: 2012-03-08 — End: 2012-04-12

## 2012-03-08 MED ORDER — TRAMADOL HCL 50 MG PO TABS
50.0000 mg | ORAL_TABLET | Freq: Two times a day (BID) | ORAL | Status: AC | PRN
Start: 2012-03-08 — End: 2012-04-07

## 2012-03-08 NOTE — Telephone Encounter (Signed)
Message copied by Dante Gang on Wed Mar 08, 2012 10:02 AM  ------       Message from: Allyne Gee       Created: Wed Mar 08, 2012  9:35 AM       Regarding: Change script                        Abrey Sortor [id number], 41 year old, female, Telephone Information:       Home Phone      [phone]       Work Phone      Not on file.       Mobile          [phone]                     Patient's Preferred Pharmacy:               [facility] - [address] - [place], [place] - [address]       Phone: [phone] Fax: [phone]                                   [name] NUMBER: [phone]       Best time to call back: today       Cell phone:                                    Patient's language of care: English              Patient does not need an interpreter.              Patient's PCP: Mitchell Heir, MD              Person calling on behalf of patient: [facility]               Calls today to request a change on the script Carmol 40% ointment to cream prescribed by dr Rosilyn Mings today 03/08/12                       ------

## 2012-03-08 NOTE — Progress Notes (Signed)
The pharmacy called again for prescription administration clarification.  I told her the Carmol was to be applied topically 3 times daily for irritation.  The Calcitonin nasal spray should be alternating nostrils once daily.

## 2012-03-08 NOTE — Progress Notes (Signed)
[facility] called to say that Carmol does not come in an ointment anymore and to ask if it was OK to change the prescription to cream.  I said this was all right.

## 2012-03-08 NOTE — Progress Notes (Signed)
Date of Service: 03/08/2012    SUBJECTIVE:  The patient presents to the office today for followup for her reflex sympathetic dystrophy of her right foot and lower leg.  She continues to show symptoms, which are tolerable with 3 nerve blocks performed 3 times a week.      ASSESSMENT AND PLAN:  Today, we have renewed her medications, including sleep medication, and I have referred her back to the Ambien due to ineffectiveness of her Xanax.  Plan is for nerve blocks to the common peroneal, posterior tibial, and an ankle block, all consisting of bupivacaine with epinephrine and 0.25 mL of dexamethasone.  She tolerated all 3 injections, all without any complications noted.  The patient will follow up with [place] on Friday for further treatment.  We have also dispensed extra compression stockings to her.    PAST MEDICAL HISTORY:  Unchanged since her last visit.    ___________________________  Reviewed and Electronically Signed By: Naoma Diener DPM  Sig Date: 04/13/2012  Sig Time: 17:42:02  Dictated By: Naoma Diener DPM  Dict Date: 03/08/2012 Dict Time: 05 24 PM    Dictation Date and Time:03/08/2012 17:24:30  Transcription Date and Time:03/08/2012 19:50:59  eScription Dictation id: [id number] Confirmation # :[id number]      DICTATED BY: Samil Mecham S Morrell Fluke DPMADAM S [facility] DPMD:03/08/2012 17:24:30 T:03/08/2012 19:50:59 NN Job#: [id number]

## 2012-03-08 NOTE — Telephone Encounter (Signed)
Message copied by Madison Mckay on Wed Mar 08, 2012 10:15 AM  ------       Message from: Madison Mckay       Created: Wed Mar 08, 2012 10:01 AM       Regarding: Clarify direction on Madison Mckay [id number], 41 year old, female, Telephone Information:       Home Phone      [phone]       Work Phone      Not on file.       Mobile          [phone]                     Patient's Preferred Pharmacy:               [facility] - [address] - [place], [place] - [address]       Phone: [phone] Fax: [phone]                                   [name] NUMBER: [phone]       Best time to call back: today                                   Patient's language of care: English              Patient does not need an interpreter.              Patient's PCP: Mitchell Heir, MD              Person calling on behalf of patient: Pharmacy              Calls today to clarify the directions on Miacalcin nasal spray prescribed by dr Rosilyn Mings today 03/08/12.                       ------

## 2012-03-10 ENCOUNTER — Ambulatory Visit (HOSPITAL_BASED_OUTPATIENT_CLINIC_OR_DEPARTMENT_OTHER): Payer: Medicaid Other | Admitting: Podiatrist

## 2012-03-10 ENCOUNTER — Ambulatory Visit (HOSPITAL_BASED_OUTPATIENT_CLINIC_OR_DEPARTMENT_OTHER): Payer: PRIVATE HEALTH INSURANCE | Admitting: Podiatrist

## 2012-03-10 ENCOUNTER — Encounter (HOSPITAL_BASED_OUTPATIENT_CLINIC_OR_DEPARTMENT_OTHER): Payer: Self-pay | Admitting: Podiatrist

## 2012-03-10 VITALS — BP 134/89 | HR 90 | Temp 99.6°F

## 2012-03-10 DIAGNOSIS — G9050 Complex regional pain syndrome I, unspecified: Secondary | ICD-10-CM

## 2012-03-10 DIAGNOSIS — M79673 Pain in unspecified foot: Secondary | ICD-10-CM

## 2012-03-10 LAB — XR ANKLE RIGHT MINIMUM 3 VIEWS

## 2012-03-10 LAB — XR FOOT RIGHT MINIMUM 3 VIEWS

## 2012-03-10 MED ORDER — DEXAMETHASONE SODIUM PHOSPHATE 4 MG/ML IJ SOLN
2.00 mg | Freq: Once | INTRAMUSCULAR | Status: AC
Start: 2012-03-10 — End: 2012-03-10

## 2012-03-10 MED ORDER — AZITHROMYCIN 250 MG PO TABS
250.00 mg | ORAL_TABLET | Freq: Every day | ORAL | Status: AC
Start: 2012-03-10 — End: 2012-03-15

## 2012-03-10 NOTE — Progress Notes (Signed)
This office note has been dictated. Account number [id number]

## 2012-03-10 NOTE — Progress Notes (Signed)
This office note has been dictated. Account number [id number]

## 2012-03-11 NOTE — Progress Notes (Signed)
Date of Service: 03/10/2012    SUBJECTIVE:  The patient presents to the office today on an urgent basis with unrelenting and severe pain in her right foot and ankle.  She is _____ and has a difficult time even relating what is going on with her foot with regard to pain.  She has primarily inferior edema and erythema on the right ankle region.  The entire leg is sore and painful.  She cannot tolerate even light touch.    RADIOLOGY:    We sent her for radiographs today including AP, lateral, and oblique views at both ankle views.  She is unable to stand in order to have the weightbearing views done so the x-rays were done nonweightbearing today.  No evidence of infection is noted.  No evidence of acute injury was noted either.      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  She has been getting serial injections 3 times a week and I am concerned that she may have lymphedema and/or possible superficial infection.  We will place her on oral antibiotics today.  We did do a common peroneal block and a posterior tibial block in both areas, which appear to be uninvolved in her pain, but I did not perform an ankle block today due to her erythema and discomfort in this area.  Plan is for her to follow up with [place] and with Dr Marcha Dutton in our office while I am out of town and I will follow up with her when I return.  I also asked her to stay in touch with me over the next couple of days to monitor her progress.  She is to return to the Emergency Room if she notes any signs of fever, chills, nausea, vomiting, night sweats, or shortness of breath or any signs of calf pain.    ___________________________  Reviewed and Electronically Signed By: Naoma Diener DPM  Sig Date: 04/13/2012  Sig Time: 17:42:17  Dictated By: Naoma Diener DPM  Dict Date: 03/11/2012 Dict Time: 08 22 AM    Dictation Date and Time:03/11/2012  08:22:04  Transcription Date and Time:03/11/2012 11:59:40  eScription Dictation id: [id number] Confirmation # :[id number]      DICTATED BY: Jaydalynn Olivero S [facility] DPMADAM S [facility] DPMD:03/11/2012 08:22:04 T:03/11/2012 11:59:40 KN Job#: [id number]

## 2012-03-13 ENCOUNTER — Encounter (HOSPITAL_BASED_OUTPATIENT_CLINIC_OR_DEPARTMENT_OTHER): Payer: Self-pay | Admitting: Foot & Ankle Surgery

## 2012-03-13 ENCOUNTER — Ambulatory Visit (HOSPITAL_BASED_OUTPATIENT_CLINIC_OR_DEPARTMENT_OTHER): Payer: PRIVATE HEALTH INSURANCE | Admitting: Foot & Ankle Surgery

## 2012-03-13 DIAGNOSIS — IMO0002 Reserved for concepts with insufficient information to code with codable children: Secondary | ICD-10-CM

## 2012-03-13 MED ORDER — DEXAMETHASONE SODIUM PHOSPHATE 10 MG/ML IJ SOLN
10.00 mg | Freq: Once | INTRAMUSCULAR | Status: AC
Start: 2012-03-13 — End: 2012-03-13

## 2012-03-13 NOTE — Progress Notes (Signed)
This office note has been dictated. Account number [id number]

## 2012-03-14 NOTE — Progress Notes (Signed)
Date of Service: 03/13/2012    41 year old female presents for followup of complex regional pain syndrome, right lower extremity.  She states that her pain is significantly worse.  She has been using crutches and a Cam walker.  Today, she presented with a Cam walker without the crutches, because it was raining.  She states that the pain is very severe right now.  She has never had pain like this.  She feels as though it is now shooting up the back of her leg.    Past medical history, medications, allergies, social and family history were reviewed with the patient.    REVIEW OF SYSTEMS:  Painful right lower extremity.    PHYSICAL EXAMINATION:  Patient is A and O x3, in mild distress.  No significant temperature change in either extremity, but there is tenderness with light touch of the right foot.  The skin is pale with some small red spots overlying the top of the right foot.  No open wounds.    ASSESSMENT:  Complex regional pain syndrome, right.    TREATMENT:  After confirming the patient's name, date of birth, correct foot and location, I performed a common peroneal block followed by an ankle block utilizing a total of 18 mL of 0.5% Marcaine with epinephrine and 0.5 mL of Decadron 10 mg/mL.  She did have good pain relief after the injection.  Hopefully it will last more than a few days, and she will follow up in 3 days for her next injection.    ___________________________  Reviewed and Electronically Signed By: Shelda Pal DPM  Sig Date: 03/18/2012  Sig Time: 08:21:52  Dictated By: Shelda Pal DPM  Dict Date: 03/13/2012 Dict Time: 11 33 AM    Dictation Date and Time:03/13/2012 11:33:32  Transcription Date and Time:03/14/2012 16:39:59  eScription Dictation id: [id number] Confirmation # :[id number]      cc: Mitchell Heir MD    DICTATED BYShelda Pal [facility] Babe Clenney DPMD:03/13/2012 11:33:32 T:03/14/2012 16:39:59 AN Job#: [id number]

## 2012-03-16 ENCOUNTER — Ambulatory Visit (HOSPITAL_BASED_OUTPATIENT_CLINIC_OR_DEPARTMENT_OTHER): Payer: PRIVATE HEALTH INSURANCE | Admitting: Foot & Ankle Surgery

## 2012-03-16 ENCOUNTER — Encounter (HOSPITAL_BASED_OUTPATIENT_CLINIC_OR_DEPARTMENT_OTHER): Payer: Self-pay | Admitting: Foot & Ankle Surgery

## 2012-03-16 DIAGNOSIS — IMO0002 Reserved for concepts with insufficient information to code with codable children: Secondary | ICD-10-CM

## 2012-03-16 MED ORDER — DEXAMETHASONE SODIUM PHOSPHATE 4 MG/ML IJ SOLN
4.00 mg | Freq: Once | INTRAMUSCULAR | Status: AC
Start: 2012-03-16 — End: 2012-03-16

## 2012-03-16 NOTE — Progress Notes (Signed)
41 y/o F presents back for regular lower extremity blocks for likely RIGHT LE CRPS. Was recently placed on Azithromax for possible lower extremity cellulitis. Apparently ankle area was swollen and red. No systemic signs of infection. Responded well to ankle block at her last visit.    Exam: palpable distal pulses, well perfused extremity. No swelling, erythema. No streaking. No calor.    A/P: 61 F with RLE CRPS.    - timeout was performed and common peroneal block + ankle block performed using total of 15-cc 1% lidocaine + epi + 1-cc dexamethasone 4-mg/mL. Patient tolerated procedure well without complication.  - may entertain idea of OR visit for liposomal marcaine block for more profound anesthesia  - follow up next week

## 2012-03-16 NOTE — Progress Notes (Signed)
Good pain relief from last injection, but still needs CAM walker. No significant change in physical exam.  Performed ankle block and Common Peroneal nerve block with 14cc of Marcaine with epi and 1cc of Decadron 4mg /ml.  F/U next week.

## 2012-03-17 ENCOUNTER — Other Ambulatory Visit (HOSPITAL_BASED_OUTPATIENT_CLINIC_OR_DEPARTMENT_OTHER): Payer: Self-pay | Admitting: Internal Medicine

## 2012-03-17 DIAGNOSIS — J45901 Unspecified asthma with (acute) exacerbation: Secondary | ICD-10-CM

## 2012-03-17 MED ORDER — ALBUTEROL SULFATE HFA 108 (90 BASE) MCG/ACT IN AERS
2.0000 | INHALATION_SPRAY | Freq: Four times a day (QID) | RESPIRATORY_TRACT | Status: AC | PRN
Start: 2012-03-17 — End: 2016-03-31

## 2012-03-17 NOTE — Telephone Encounter (Signed)
Message copied by Stanford Scotland on Fri Mar 17, 2012 10:29 AM  ------       Message from: Jeryl Columbia       Created: Fri Mar 17, 2012 10:26 AM       Regarding: albuterol (PROAIR HFA) 108 (90 BASE) MCG/ACT inhaler          [facility]              Person calling on behalf of patient: Pharmacy              May list multiple medications in this section              Medicine Name: albuterol (PROAIR HFA)               Dosage: 108 (90 BASE) MCG/ACT inhaler               Frequency (how many pills, how many times a day):               Number of pills left:               Documented patient preferred pharmacies:        [facility] - [address] - [place], [place] - [address]       Phone: [phone] Fax: [phone]                                            ------

## 2012-03-17 NOTE — Progress Notes (Signed)
PER Pharmacy,     Madison Mckay, a 41 year old female has requested a refill of albuterol inhaler.    Last Office Visit: 01/06/2012  Last Physical Exam: 07/06/2010    Other Med Adult:  Most Recent BP Reading(s)  03/10/12 : 134/89        No results found for this basename: cholesterol    No results found for this basename: LDL    No results found for this basename: HDL    No results found for this basename: tg        No results found for this basename: TSHSC        TSH (THYROID STIM HORMONE) (uIU/mL)   Date  Value    07/06/2010  0.46    ----------      No results found for this basename: hgba1c        No results found for this basename: INR       Documented patient preferred pharmacies:    [facility] - [address] - [place], [place] - [address]  Phone: [phone] Fax: [phone]

## 2012-03-21 ENCOUNTER — Ambulatory Visit (HOSPITAL_BASED_OUTPATIENT_CLINIC_OR_DEPARTMENT_OTHER): Payer: PRIVATE HEALTH INSURANCE | Admitting: Foot & Ankle Surgery

## 2012-03-21 ENCOUNTER — Encounter (HOSPITAL_BASED_OUTPATIENT_CLINIC_OR_DEPARTMENT_OTHER): Payer: Self-pay | Admitting: Foot & Ankle Surgery

## 2012-03-21 DIAGNOSIS — IMO0002 Reserved for concepts with insufficient information to code with codable children: Secondary | ICD-10-CM

## 2012-03-21 MED ORDER — DEXAMETHASONE SODIUM PHOSPHATE 4 MG/ML IJ SOLN
4.00 mg | Freq: Once | INTRAMUSCULAR | Status: AC
Start: 2012-03-21 — End: 2012-03-21

## 2012-03-21 NOTE — Progress Notes (Signed)
41 year old female presents for followup of complex regional pain syndrome, right lower extremity. She complains of swelling and warmth to her medial ankle. She has been ambulating in a sneaker and is leaving for [place] tomorrow AM.    Past medical history, medications, allergies, social and family history were reviewed with the patient.    REVIEW OF SYSTEMS:  Painful right lower extremity.    PHYSICAL EXAMINATION:  Patient is A and O x3, in mild distress.  Right foot is warmer, especially around medial malleolus.  The skin is pale with mild erythema around medial malleolus.  No open wounds.    ASSESSMENT:  Complex regional pain syndrome, right.    TREATMENT:  After confirming the patient's name, date of birth, correct foot and location, I performed a common peroneal block followed by an ankle block utilizing a total of 15 mL of 0.5% Marcaine with epinephrine and 1 mL of Decadron 4 mg/mL.  She did have good pain relief after the injection. The erythema disappeared immediately after the injection. F/U with Dr. Rosilyn Mings when she returns from her trip.

## 2012-03-24 ENCOUNTER — Telehealth (HOSPITAL_BASED_OUTPATIENT_CLINIC_OR_DEPARTMENT_OTHER): Payer: Self-pay

## 2012-03-24 NOTE — Progress Notes (Signed)
Patient was seen by Dr. Marcha Dutton on Tuesday and given an injection.  At the time her ankle was red and swollen.  The injection helped but it has now worn off and her ankle is back to the same as it was on Tuesday when she came to the office.  She is currently in [place].  She has an appointment with Dr. Rosilyn Mings on 10/28.  Will ask Dr. Rosilyn Mings if he has any ideas.

## 2012-03-24 NOTE — Telephone Encounter (Signed)
Message copied by Dante Gang on Fri Mar 24, 2012  4:04 PM  ------       Message from: Allyne Gee       Created: Fri Mar 24, 2012 11:35 AM       Regarding: Swollen foot ankle, still in pain                        Madison Mckay [id number], 41 year old, female, Telephone Information:       Home Phone      [phone]       Work Phone      Not on file.       Mobile          [phone]                     Patient's Preferred Pharmacy:               [facility] - [address] - [place], [place] - [address]       Phone: [phone] Fax: [phone]                                   [name] NUMBER: [phone]       Best time to call back: today                                   Patient's language of care: English              Patient does not need an interpreter.              Patient's PCP: Mitchell Heir, MD              Person calling on behalf of patient: Patient (self)              Calls today to speak to dr Rosilyn Mings or the podiatry doctor resident regarding her swollen foot ankle, is still in pain.                        ------

## 2012-03-27 ENCOUNTER — Other Ambulatory Visit (HOSPITAL_BASED_OUTPATIENT_CLINIC_OR_DEPARTMENT_OTHER): Payer: Self-pay | Admitting: Internal Medicine

## 2012-03-27 DIAGNOSIS — J45901 Unspecified asthma with (acute) exacerbation: Secondary | ICD-10-CM

## 2012-03-27 NOTE — Telephone Encounter (Signed)
Message copied by Stanford Scotland on Mon Mar 27, 2012  1:01 PM  ------       Message from: Natalia Leatherwood       Created: Mon Mar 27, 2012  9:57 AM       Regarding: Rx         [facility]              Person calling on behalf of patient: Patient (self)              May list multiple medications in this section              Medicine Name: IPRAT-ALBUT 0.5-3(2.5)              Dosage:               Frequency (how many pills, how many times a day):               Number of pills left:               Documented patient preferred pharmacies:        [facility] - [address] - [place],  - [address]       Phone: [phone] Fax: [phone]                     Pharmacy Name:               Pharmacy Telephone Number:        Pharmacy  Fax Number:               CALL BACK NUMBER:               Cell phone:                Other phone:               Available times:               Patient's language of care: English              Patient needs a  interpreter.                              ------

## 2012-03-27 NOTE — Progress Notes (Signed)
PER Patient (self),     Madison Mckay, a 41 year old female has requested a refill of DuoNeb nebulizer solution.    Please note: DuoNeb was discontinued on 07/21/2011    Last Office Visit: 01/06/2012  Last Physical Exam: n/a    Other Med Adult:  Most Recent BP Reading(s)  03/10/12 : 134/89        No results found for this basename: cholesterol    No results found for this basename: LDL    No results found for this basename: HDL    No results found for this basename: tg        No results found for this basename: TSHSC        TSH (THYROID STIM HORMONE) (uIU/mL)   Date  Value    07/06/2010  0.46    ----------      No results found for this basename: hgba1c        No results found for this basename: INR       Documented patient preferred pharmacies:    [facility] - [address] - [place],  - [address]  Phone: [phone] Fax: [phone]

## 2012-04-03 ENCOUNTER — Ambulatory Visit (HOSPITAL_BASED_OUTPATIENT_CLINIC_OR_DEPARTMENT_OTHER): Payer: Medicaid Other | Admitting: Podiatrist

## 2012-04-03 DIAGNOSIS — G9050 Complex regional pain syndrome I, unspecified: Secondary | ICD-10-CM

## 2012-04-03 MED ORDER — ALPRAZOLAM 1 MG PO TABS
1.5000 mg | ORAL_TABLET | Freq: Every evening | ORAL | Status: AC | PRN
Start: 2012-04-03 — End: 2012-05-03

## 2012-04-03 MED ORDER — ZOLPIDEM TARTRATE ER 6.25 MG PO TBCR
6.2500 mg | EXTENDED_RELEASE_TABLET | Freq: Every evening | ORAL | Status: DC | PRN
Start: 2012-04-03 — End: 2012-04-12

## 2012-04-03 MED ORDER — OXYCODONE HCL ER 10 MG PO TB12
10.0000 mg | ORAL_TABLET | Freq: Every day | ORAL | Status: AC
Start: 2012-04-03 — End: 2012-05-03

## 2012-04-03 MED ORDER — TAPENTADOL HCL ER 150 MG PO TB12
1.0000 | ORAL_TABLET | Freq: Four times a day (QID) | ORAL | Status: AC
Start: 2012-04-03 — End: 2012-05-22

## 2012-04-05 ENCOUNTER — Ambulatory Visit (HOSPITAL_BASED_OUTPATIENT_CLINIC_OR_DEPARTMENT_OTHER): Payer: Medicaid Other | Admitting: Podiatrist

## 2012-04-05 ENCOUNTER — Encounter (HOSPITAL_BASED_OUTPATIENT_CLINIC_OR_DEPARTMENT_OTHER): Payer: Self-pay | Admitting: Podiatrist

## 2012-04-05 DIAGNOSIS — G9050 Complex regional pain syndrome I, unspecified: Secondary | ICD-10-CM

## 2012-04-05 DIAGNOSIS — R21 Rash and other nonspecific skin eruption: Secondary | ICD-10-CM

## 2012-04-05 MED ORDER — HYDROCORTISONE 2.5 % EX OINT
TOPICAL_OINTMENT | Freq: Two times a day (BID) | CUTANEOUS | Status: AC
Start: 2012-04-05 — End: 2012-04-19

## 2012-04-05 MED ORDER — DEXAMETHASONE SODIUM PHOSPHATE 4 MG/ML IJ SOLN
2.00 mg | Freq: Once | INTRAMUSCULAR | Status: AC
Start: 2012-04-05 — End: 2012-04-05

## 2012-04-05 MED ORDER — VALACYCLOVIR HCL 500 MG PO TABS
500.0000 mg | ORAL_TABLET | Freq: Two times a day (BID) | ORAL | Status: AC
Start: 2012-04-05 — End: 2012-04-12

## 2012-04-05 NOTE — Progress Notes (Signed)
This office note has been dictated. Account number [id number]

## 2012-04-05 NOTE — Progress Notes (Signed)
Date of Service: 04/05/2012    SUBJECTIVE:  The patient presents to the office today for followup for reflex sympathetic dystrophy in her right foot and leg.  She indicates that she is still having excruciating pain since returning from her trip out of town.  She presents today with a new concern of a small rash on the medial calf of the right leg.  It is slightly vesicular in nature and it is burning and irritated.  Her foot pain remains relentless, and although she does get some relief following the injections, her pain resumes very quickly.    ASSESSMENT AND PLAN:  Regional nerve blocks were performed today at the common peroneal, posterior tibial nerves, as well as an ankle block, all with 4.5 mL of bupivacaine with epinephrine and a 0.25 of a mL of dexamethasone 4 mg/mL.  She tolerated the 3 nerve blocks without any complications.  Prescriptions were given to her today for Valtrex as the wound does have some appearance of shingles, although it does not follow the classic dermatone lines.  I have also given her hydrocortisone ointment 2.5% to help try to reduce some of the pain in this area.  We will have her follow up with [place] on Friday for repeat injections as needed, and we will have her contact us if she has difficulties between now and then.    ___________________________  Reviewed and Electronically Signed By: Naoma Diener DPM  Sig Date: 04/13/2012  Sig Time: 17:43:52  Dictated By: Naoma Diener DPM  Dict Date: 04/05/2012 Dict Time: 01 23 PM    Dictation Date and Time:04/05/2012 13:23:39  Transcription Date and Time:04/05/2012 14:07:42  eScription Dictation id: [id number] Confirmation # :[id number]      DICTATED BY: Labaron Digirolamo S [facility] DPMADAM S [facility] DPMD:04/05/2012 13:23:39 T:04/05/2012 14:07:42 JN Job#: [id number]

## 2012-04-07 ENCOUNTER — Ambulatory Visit (HOSPITAL_BASED_OUTPATIENT_CLINIC_OR_DEPARTMENT_OTHER): Payer: Medicaid Other | Admitting: Podiatrist

## 2012-04-07 VITALS — BP 140/106 | HR 109 | Temp 98.7°F

## 2012-04-07 DIAGNOSIS — G9050 Complex regional pain syndrome I, unspecified: Secondary | ICD-10-CM

## 2012-04-07 DIAGNOSIS — M792 Neuralgia and neuritis, unspecified: Secondary | ICD-10-CM

## 2012-04-07 NOTE — Progress Notes (Signed)
This office note has been dictated. Account number [id number]

## 2012-04-10 ENCOUNTER — Ambulatory Visit (HOSPITAL_BASED_OUTPATIENT_CLINIC_OR_DEPARTMENT_OTHER): Payer: PRIVATE HEALTH INSURANCE | Admitting: Podiatrist

## 2012-04-10 DIAGNOSIS — G9050 Complex regional pain syndrome I, unspecified: Secondary | ICD-10-CM

## 2012-04-10 MED ORDER — DEXAMETHASONE SODIUM PHOSPHATE 4 MG/ML IJ SOLN
2.00 mg | Freq: Once | INTRAMUSCULAR | Status: AC
Start: 2012-04-10 — End: 2012-04-10

## 2012-04-10 NOTE — Progress Notes (Signed)
This office note has been dictated. Account number [id number]

## 2012-04-10 NOTE — Progress Notes (Signed)
Date of Service: 04/10/2012    SUBJECTIVE:  The patient presents to the office today for continued treatment for reflex sympathetic dystrophy of her right foot and lower leg.  She indicates that she is able to get 2 or 3 days of where her condition is tolerable followed by severe return of pain again.  She is here today for sequential nerve blocks at her common peroneal, posterior tibial, and at her ankle.      She tolerated the injections well.  The injection consisted of 4.75 mL of bupivacaine with epinephrine and 0.25 mL of dexamethasone to each site.  Plan is for her to follow up with [place] in 2-3 days on a p.r.n. basis.    PAST MEDICAL HISTORY:  Otherwise unchanged since her last visit.    ___________________________  Reviewed and Electronically Signed By: Naoma Diener DPM  Sig Date: 04/13/2012  Sig Time: 17:44:40  Dictated By: Naoma Diener DPM  Dict Date: 04/10/2012 Dict Time: 03 28 PM    Dictation Date and Time:04/10/2012 15:28:19  Transcription Date and Time:04/10/2012 21:10:48  eScription Dictation id: [id number] Confirmation # :[id number]      DICTATED BY: Belita Warsame S [facility] DPMADAM S [facility] DPMD:04/10/2012 15:28:19 T:04/10/2012 21:10:48 DN Job#: [id number]

## 2012-04-12 ENCOUNTER — Ambulatory Visit (HOSPITAL_BASED_OUTPATIENT_CLINIC_OR_DEPARTMENT_OTHER): Payer: Medicaid Other | Admitting: Podiatrist

## 2012-04-12 DIAGNOSIS — M79673 Pain in unspecified foot: Secondary | ICD-10-CM

## 2012-04-12 DIAGNOSIS — G9050 Complex regional pain syndrome I, unspecified: Secondary | ICD-10-CM

## 2012-04-12 DIAGNOSIS — M792 Neuralgia and neuritis, unspecified: Secondary | ICD-10-CM

## 2012-04-12 MED ORDER — ZOLPIDEM TARTRATE ER 6.25 MG PO TBCR
6.2500 mg | EXTENDED_RELEASE_TABLET | Freq: Every evening | ORAL | Status: DC | PRN
Start: 2012-04-12 — End: 2012-05-17

## 2012-04-12 MED ORDER — DIAZEPAM 5 MG PO TABS
5.0000 mg | ORAL_TABLET | Freq: Two times a day (BID) | ORAL | Status: DC | PRN
Start: 2012-04-12 — End: 2012-05-24

## 2012-04-12 MED ORDER — HYDROXYZINE HCL 25 MG PO TABS
25.0000 mg | ORAL_TABLET | Freq: Three times a day (TID) | ORAL | Status: AC | PRN
Start: 2012-04-12 — End: 2013-04-12

## 2012-04-12 NOTE — Progress Notes (Signed)
Date of Service: 04/12/2012    SUBJECTIVE:  The patient presents today to the office for evaluation for continued pain in her right foot.  She is extremely anxious and extremely uncomfortable today.  She is frustrated with the lack of progress that she is experiencing.      TREATMENT:  Today, we have renewed prescriptions for her for Valium, Ambien, and Xanax.  We have also performed 3 nerve conduction blocks at the anterior ankle, the posterior tibial nerve, and the common peroneal nerve all on the right limb.  She tolerated the injections well without any complications noted.  The injections consisting of 0.5% bupivacaine with epinephrine and dexamethasone 0.25 mL to each site.      PLAN:  For her to follow up with [place] in 2-3 days.    ___________________________  Reviewed and Electronically Signed By: Naoma Diener DPM  Sig Date: 04/13/2012  Sig Time: 17:45:09  Dictated By: Naoma Diener DPM  Dict Date: 04/12/2012 Dict Time: 02 33 PM    Dictation Date and Time:04/12/2012 14:33:40  Transcription Date and Time:04/12/2012 17:58:01  eScription Dictation id: [id number] Confirmation # :[id number]      DICTATED BY: Shailen Thielen S [facility] DPMADAM S [facility] DPMD:04/12/2012 14:33:40 T:04/12/2012 17:58:01 CN Job#: [id number]

## 2012-04-13 MED ORDER — DEXAMETHASONE SODIUM PHOSPHATE 4 MG/ML IJ SOLN
2.00 mg | Freq: Once | INTRAMUSCULAR | Status: AC
Start: 2012-04-13 — End: 2012-04-13

## 2012-04-13 NOTE — Progress Notes (Signed)
This office note has been dictated. Account number [id number]

## 2012-04-13 NOTE — Progress Notes (Signed)
SUBJECTIVE: The patient presents to the office today with unrelenting and severe pain in her right foot and ankle. She is having a difficult time even relating what is going on with her foot with regard to pain. She has primarily inferior edema and erythema on the right ankle region. The entire leg is sore and painful. She can tolerate light touch.     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 She has been getting serial injections 3 times a week.  We did do a common peroneal block and a posterior tibial block and an ankle block today.  Injections contained 15% bupivicaine with epi and 1/4cc dexamethasone.  She tolerated the injections without any complications.

## 2012-04-14 ENCOUNTER — Ambulatory Visit (HOSPITAL_BASED_OUTPATIENT_CLINIC_OR_DEPARTMENT_OTHER): Payer: PRIVATE HEALTH INSURANCE | Admitting: Podiatrist

## 2012-04-14 DIAGNOSIS — M792 Neuralgia and neuritis, unspecified: Secondary | ICD-10-CM

## 2012-04-14 DIAGNOSIS — G9050 Complex regional pain syndrome I, unspecified: Secondary | ICD-10-CM

## 2012-04-16 MED ORDER — DEXAMETHASONE SODIUM PHOSPHATE 4 MG/ML IJ SOLN
2.00 mg | Freq: Once | INTRAMUSCULAR | Status: AC
Start: 2012-04-16 — End: 2012-04-16

## 2012-04-16 NOTE — Progress Notes (Signed)
Date of Service: 04/07/2012    SUBJECTIVE:  The patient presents to the office today for followup for reflex sympathetic dystrophy.  We will repeat the same procedure again utilizing 3 nerve conduction blocks at the common peroneal, posterior tibial, and an ankle block, all on the right side.  Each injection consisted of 5 mL of bupivacaine with epinephrine and 0.25 mL of dexamethasone 4 mg per mL.      ASSESSMENT AND PLAN:  She tolerated the injections well without any complications noted.  She continues to have severe and unrelenting pain, and we continue to look for other treatment options for her.    ___________________________  Reviewed and Electronically Signed By: Naoma Diener DPM  Sig Date: 04/16/2012  Sig Time: 19:39:44  Dictated By: Naoma Diener DPM  Dict Date: 04/16/2012 Dict Time: 03 05 PM    Dictation Date and Time:04/16/2012 15:05:56  Transcription Date and Time:04/16/2012 15:34:35  eScription Dictation id: [id number] Confirmation # :[id number]      DICTATED BY: Jud Fanguy S [facility] DPMADAM S [facility] DPMD:04/16/2012 15:05:56 T:04/16/2012 15:34:35 NN Job#: [id number]

## 2012-04-16 NOTE — Progress Notes (Signed)
Date of Service: 04/14/2012    SUBJECTIVE:  The patient presents to the office today for followup for reflex sympathetic dystrophy and chronic pain in her right foot and lower leg.  She indicates that she has some good days and bad days; however, if she goes beyond 2 or 3 days without a nerve block she indicates that her pain becomes intolerable.  She denies fever, chills, nausea, vomiting, night sweats, and shortness of breath.  There is no evidence of breaks in the skin or signs of cellulitis at this time.  Her foot is only slightly cooler as compared to the contralateral limb today.    PAST MEDICAL HISTORY:  Otherwise unchanged since her last visit.    ASSESSMENT AND PLAN:  For the same 3 nerve blocks at the common peroneal, posterior tibial, and ankle block, each consisting of approximately 5 mL of bupivacaine with epinephrine and 0.25 mL of dexamethasone.  She tolerated all 3 injections well without any complications noted.  We will follow up with her in approximately 3-4 days.    ___________________________  Reviewed and Electronically Signed By: Naoma Diener DPM  Sig Date: 04/16/2012  Sig Time: 19:40:01  Dictated By: Naoma Diener DPM  Dict Date: 04/16/2012 Dict Time: 07 11 PM    Dictation Date and Time:04/16/2012 19:11:34  Transcription Date and Time:04/16/2012 19:35:58  eScription Dictation id: [id number] Confirmation # :[id number]      DICTATED BY: Kresta Templeman S [facility] DPMADAM S [facility] DPMD:04/16/2012 19:11:34 T:04/16/2012 19:35:58 JN Job#: [id number]

## 2012-04-17 ENCOUNTER — Ambulatory Visit (HOSPITAL_BASED_OUTPATIENT_CLINIC_OR_DEPARTMENT_OTHER): Payer: PRIVATE HEALTH INSURANCE | Admitting: Podiatrist

## 2012-04-17 DIAGNOSIS — G9050 Complex regional pain syndrome I, unspecified: Secondary | ICD-10-CM

## 2012-04-17 DIAGNOSIS — M792 Neuralgia and neuritis, unspecified: Secondary | ICD-10-CM

## 2012-04-17 MED ORDER — DEXAMETHASONE SODIUM PHOSPHATE 4 MG/ML IJ SOLN
2.00 mg | Freq: Once | INTRAMUSCULAR | Status: AC
Start: 2012-04-17 — End: 2012-04-17

## 2012-04-17 NOTE — Progress Notes (Signed)
SUBJECTIVE: The patient presents to the office today for followup for reflex sympathetic dystrophy and chronic pain in her right foot and lower leg. She indicates that she has some good days and bad days; however, if she goes beyond 2 or 3 days without a nerve block she indicates that her pain becomes intolerable. She denies fever, chills, nausea, vomiting, night sweats, and shortness of breath. There is no evidence of breaks in the skin or signs of cellulitis at this time. Her foot is only slightly cooler as compared to the contralateral limb today. Patient also reports ingrown toenail pain to the medial aspect of her right hallucal nail.    PAST MEDICAL HISTORY: Otherwise unchanged since her last visit.     ASSESSMENT AND PLAN:   Onychocryptosis- medial border of right hallucal nail trimmed using nail nippers  Reflex sympathetic dystrophy with chronic pain-- For the same 3 nerve blocks at the common peroneal, posterior tibial, and ankle block, each consisting of approximately 3.5 mL of bupivacaine with epinephrine, 1 mL of lidocaine with epinephrine, and 0.25 mL of dexamethasone. She tolerated all 3 injections well without any complications noted. We will follow up with her in approximately 3-4 days.

## 2012-04-17 NOTE — Progress Notes (Signed)
SUBJECTIVE: The patient presents to the office today for followup for reflex sympathetic dystrophy and chronic pain in her right foot and lower leg. She indicates that she has some good days and bad days; however, if she goes beyond 2 or 3 days without a nerve block she indicates that her pain becomes intolerable. She denies fever, chills, nausea, vomiting, night sweats, and shortness of breath. There is no evidence of breaks in the skin or signs of cellulitis at this time. Her foot is only slightly cooler as compared to the contralateral limb today. Patient also reports ingrown toenail pain to the medial aspect of her right hallucal nail.    PAST MEDICAL HISTORY: Otherwise unchanged since her last visit.     ASSESSMENT AND PLAN:   Onychocryptosis- medial border of right hallucal nail trimmed using nail nippers  Reflex sympathetic dystrophy with chronic pain-- For the same 3 nerve blocks at the common peroneal, posterior tibial, and ankle block, each consisting of approximately 3.5 mL of bupivacaine with epinephrine, 1 mL of lidocaine with epinephrine, and 0.25 mL of dexamethasone. She tolerated all 3 injections well without any complications noted. We will follow up with her in approximately 3-4 days.

## 2012-04-19 ENCOUNTER — Encounter (HOSPITAL_BASED_OUTPATIENT_CLINIC_OR_DEPARTMENT_OTHER): Payer: Self-pay | Admitting: Podiatrist

## 2012-04-19 ENCOUNTER — Ambulatory Visit (HOSPITAL_BASED_OUTPATIENT_CLINIC_OR_DEPARTMENT_OTHER): Payer: PRIVATE HEALTH INSURANCE | Admitting: Podiatrist

## 2012-04-19 DIAGNOSIS — M792 Neuralgia and neuritis, unspecified: Secondary | ICD-10-CM

## 2012-04-19 DIAGNOSIS — G9050 Complex regional pain syndrome I, unspecified: Secondary | ICD-10-CM

## 2012-04-19 DIAGNOSIS — S9030XA Contusion of unspecified foot, initial encounter: Secondary | ICD-10-CM

## 2012-04-19 NOTE — Progress Notes (Signed)
SUBJECTIVE: The patient presents to the office today for followup for reflex sympathetic dystrophy and chronic pain in her right foot and lower leg. She indicates that she has some good days and bad days; however, if she goes beyond 2 or 3 days without a nerve block she indicates that her pain becomes intolerable. She denies fever, chills, nausea, vomiting, night sweats, and shortness of breath. There is no evidence of breaks in the skin or signs of cellulitis at this time. Her foot is only slightly cooler as compared to the contralateral limb today.      PAST MEDICAL HISTORY: Otherwise unchanged since her last visit.     ASSESSMENT AND PLAN: For the same 3 nerve blocks at the common peroneal, posterior tibial, and ankle block, each consisting of approximately 5 mL of bupivacaine with epinephrine and 0.25 mL of dexamethasone. She tolerated all 3 injections well without any complications noted. We will follow up with her in approximately 3-4 days.

## 2012-04-21 ENCOUNTER — Ambulatory Visit (HOSPITAL_BASED_OUTPATIENT_CLINIC_OR_DEPARTMENT_OTHER): Payer: PRIVATE HEALTH INSURANCE | Admitting: Podiatrist

## 2012-04-21 DIAGNOSIS — M792 Neuralgia and neuritis, unspecified: Secondary | ICD-10-CM

## 2012-04-21 DIAGNOSIS — G9050 Complex regional pain syndrome I, unspecified: Secondary | ICD-10-CM

## 2012-04-21 DIAGNOSIS — S9030XA Contusion of unspecified foot, initial encounter: Secondary | ICD-10-CM

## 2012-04-21 NOTE — Progress Notes (Signed)
SUBJECTIVE: The patient presents to the office today for followup for reflex sympathetic dystrophy and chronic pain in her right foot and lower leg. She indicates that she has some good days and bad days; however, if she goes beyond 2 or 3 days without a nerve block she indicates that her pain becomes intolerable. She denies fever, chills, nausea, vomiting, night sweats, and shortness of breath. There is no evidence of breaks in the skin or signs of cellulitis at this time. Her foot is only slightly cooler as compared to the contralateral limb today.     PAST MEDICAL HISTORY: Otherwise unchanged since her last visit.    ASSESSMENT AND PLAN:   Reflex sympathetic dystrophy with chronic pain-- For the same 3 nerve blocks at the common peroneal, posterior tibial, and ankle block, each consisting of approximately 3.5 mL of bupivacaine with epinephrine, 1 mL of lidocaine with epinephrine, and 0.25 mL of dexamethasone. She tolerated all 3 injections well without any complications noted. We will follow up with her in approximately 3-4 days.

## 2012-04-24 ENCOUNTER — Ambulatory Visit (HOSPITAL_BASED_OUTPATIENT_CLINIC_OR_DEPARTMENT_OTHER): Payer: Medicaid Other | Admitting: Podiatrist

## 2012-04-24 DIAGNOSIS — M792 Neuralgia and neuritis, unspecified: Secondary | ICD-10-CM

## 2012-04-24 DIAGNOSIS — G9050 Complex regional pain syndrome I, unspecified: Secondary | ICD-10-CM

## 2012-04-25 MED ORDER — DEXAMETHASONE SODIUM PHOSPHATE 4 MG/ML IJ SOLN
2.00 mg | Freq: Once | INTRAMUSCULAR | Status: AC
Start: 2012-04-25 — End: 2012-04-25

## 2012-04-25 NOTE — Progress Notes (Signed)
Pt returns for continued care for RSD, right foot.  Patient reports lots of pins and needles.  She appears to be having some symptoms of Raynauds today, with sluggish cap refill, greater than 2 seconds on all areas of the foot, including the sole.  Also, her ankle is slightly swollen anteriorly, so we will forego the ankle block today.      Remaining block at the common peroneal and posterior tibial nerve were performed in the standard fashion, with Bupivicaine with epi (4.5cc), and dexamethasone 4mg /ml, 1/4 cc, to each site.  She tolerated the injections without any complications.  PMH otherwise unchanged.

## 2012-04-26 ENCOUNTER — Ambulatory Visit (HOSPITAL_BASED_OUTPATIENT_CLINIC_OR_DEPARTMENT_OTHER): Payer: PRIVATE HEALTH INSURANCE | Admitting: Podiatrist

## 2012-04-26 ENCOUNTER — Encounter (HOSPITAL_BASED_OUTPATIENT_CLINIC_OR_DEPARTMENT_OTHER): Payer: Self-pay | Admitting: Podiatrist

## 2012-04-26 DIAGNOSIS — G9050 Complex regional pain syndrome I, unspecified: Secondary | ICD-10-CM

## 2012-04-26 DIAGNOSIS — M792 Neuralgia and neuritis, unspecified: Secondary | ICD-10-CM

## 2012-04-26 DIAGNOSIS — S9030XA Contusion of unspecified foot, initial encounter: Secondary | ICD-10-CM

## 2012-04-26 NOTE — Progress Notes (Signed)
Subjective:   Madison Mckay Is a 41 year old female presenting for f/u on RSD. She complains of pins and needles pain that is the same in presentation since last visit. She states that the recent cold weather has made her symptoms worse.   Objective:   General: A&Ox3, in NAD, plesaant   Lower Extremity Focused Physical Exam:   Vascular: DP and PT pulses are faint b/l.   Neurological: Allodynia present right.   Dermatological: No open wounds b/l. Interspaces clean and dry b/l. Normal turgor and tone b/l. No HKT present b/l. No edema present b/l. Right foot cold and clamy to touch.   Musculoskeletal: Pain with palpation of all regions ankle and below.   Assessment:   1. RSD right.   Plan:   1. Anesthetic block of the common peroneal and tibia nerves, and also ankle were performed today in standard fashion. 4.5ccs of bupivacaine with epi and 0.25cc dexamethasone 4 were injected into each of the aforementioned distributions, respectively. Injections well tolerated without incident.   2. Follow up in a couple of days for further injections for RSD.

## 2012-04-28 ENCOUNTER — Ambulatory Visit (HOSPITAL_BASED_OUTPATIENT_CLINIC_OR_DEPARTMENT_OTHER): Payer: Medicaid Other | Admitting: Podiatrist

## 2012-04-30 MED ORDER — DEXAMETHASONE SODIUM PHOSPHATE 4 MG/ML IJ SOLN
2.00 mg | Freq: Once | INTRAMUSCULAR | Status: AC
Start: 2012-04-30 — End: 2012-04-30

## 2012-04-30 NOTE — Progress Notes (Signed)
SUBJECTIVE: The patient presents to the office today for followup for reflex sympathetic dystrophy and chronic pain in her right foot and lower leg. She indicates that she has some good days and bad days; however, if she goes beyond 2 or 3 days without a nerve block she indicates that her pain becomes intolerable. She denies fever, chills, nausea, vomiting, night sweats, and shortness of breath. There is no evidence of breaks in the skin or signs of cellulitis at this time. Her foot is only slightly cooler as compared to the contralateral limb today.      PAST MEDICAL HISTORY: Otherwise unchanged since her last visit.     ASSESSMENT AND PLAN: For the same 3 nerve blocks at the common peroneal, posterior tibial, and ankle block, each consisting of approximately 5 mL of bupivacaine with epinephrine and 0.25 mL of dexamethasone. She tolerated all 3 injections well without any complications noted. We will follow up with her in approximately 3-4 days.

## 2012-04-30 NOTE — Progress Notes (Signed)
Subjective:   Madison Mckay Is a 41 year old female presenting for f/u on RSD. She complains of pins and needles pain that is the same in presentation since last visit. She states that the recent cold weather has made her symptoms worse.   Objective:   General: A&Ox3, in NAD, plesaant   Lower Extremity Focused Physical Exam:   Vascular: DP and PT pulses are faint b/l.   Neurological: Allodynia present right.   Dermatological: No open wounds b/l. Interspaces clean and dry b/l. Normal turgor and tone b/l. No HKT present b/l. No edema present b/l. Right foot cold and clamy to touch.   Musculoskeletal: Pain with palpation of all regions ankle and below.   Assessment:   1. RSD right.   Plan:   1. Anesthetic block of the common peroneal and tibia nerves, and also ankle were performed today in standard fashion. 4.5ccs of bupivacaine with epi and 0.25cc dexamethasone 4 were injected into each of the aforementioned distributions, respectively. Injections well tolerated without incident.   2. Follow up in a couple of days for further injections for RSD.

## 2012-05-01 ENCOUNTER — Ambulatory Visit (HOSPITAL_BASED_OUTPATIENT_CLINIC_OR_DEPARTMENT_OTHER): Payer: Medicaid Other | Admitting: Podiatrist

## 2012-05-01 DIAGNOSIS — M792 Neuralgia and neuritis, unspecified: Secondary | ICD-10-CM

## 2012-05-01 DIAGNOSIS — G9050 Complex regional pain syndrome I, unspecified: Secondary | ICD-10-CM

## 2012-05-01 DIAGNOSIS — S9030XA Contusion of unspecified foot, initial encounter: Secondary | ICD-10-CM

## 2012-05-01 MED ORDER — DEXAMETHASONE SODIUM PHOSPHATE 4 MG/ML IJ SOLN
2.00 mg | Freq: Once | INTRAMUSCULAR | Status: AC
Start: 2012-05-01 — End: 2012-05-01

## 2012-05-01 NOTE — Progress Notes (Signed)
SUBJECTIVE: The patient presents to the office today for followup for reflex sympathetic dystrophy and chronic pain in her right foot and lower leg. She indicates that she has some good days and bad days; however, if she goes beyond 2 or 3 days without a nerve block she indicates that her pain becomes intolerable. She denies fever, chills, nausea, vomiting, night sweats, and shortness of breath. There is no evidence of breaks in the skin or signs of cellulitis at this time. Her foot is only slightly cooler as compared to the contralateral limb today.     PAST MEDICAL HISTORY: Otherwise unchanged since her last visit.    ASSESSMENT AND PLAN:   Reflex sympathetic dystrophy with chronic pain-- For the same 3 nerve blocks at the common peroneal, posterior tibial, and ankle block, each consisting of approximately 3.5 mL of bupivacaine with epinephrine, 1 mL of lidocaine with epinephrine, and 0.25 mL of dexamethasone. She tolerated all 3 injections well without any complications noted. We will follow up with her in approximately 3-4 days.    This office note has been dictated. Account number [id number]

## 2012-05-01 NOTE — Progress Notes (Signed)
Date of Service: 04/24/2012    The patient presents to the office today with a chief concern of pain in her right foot and lower leg.  Pain appears to have improved slightly since her last visit.  She denies fever, chills, nausea, vomiting, night sweats, and shortness of breath.  There is no evidence of breaks in the skin.  She has been under our care for reflex sympathetic dystrophy to her right foot and lower leg following a contusion to her right foot.    Treatment today consisted of common peroneal, posterior tibial, and ankle nerve blocks, each utilizing 4.75 mL of bupivacaine with epinephrine and 0.25 mL of dexamethasone 4 mg/mL.  She tolerated all 3 injections, all without any complications noted.  She noted immediate improvement following injection.  Plan is to follow up with her in 2-3 days.    ___________________________  Reviewed and Electronically Signed By: Naoma Diener DPM  Sig Date: 06/06/2012  Sig Time: 08:43:49  Dictated By: Naoma Diener DPM  Dict Date: 05/01/2012 Dict Time: 03 24 PM    Dictation Date and Time:05/01/2012 15:24:58  Transcription Date and Time:05/01/2012 18:40:00  eScription Dictation id: [id number] Confirmation # :[id number]      DICTATED BY: Maddon Horton S [facility] DPMADAM S [facility] DPMD:05/01/2012 15:24:58 T:05/01/2012 18:40:00 CN Job#: [id number]

## 2012-05-01 NOTE — Progress Notes (Signed)
Date of Service: 04/21/2012    The patient presents to the office today with a chief concern of reflex sympathetic dystrophy secondary to a contusion of her right foot.  Today, we performed 3 nerve blocks at the common peroneal, posterior tibial, and ankle.  Each injection consisted of 4.75 mL of bupivacaine with epinephrine and 0.25 mL of dexamethasone 4 mg/mL.  She tolerated all injections well without any complications noted.  Plan is for her to follow up with [place] in 3-5 days.    ___________________________  Reviewed and Electronically Signed By: Naoma Diener DPM  Sig Date: 06/06/2012  Sig Time: 08:43:43  Dictated By: Naoma Diener DPM  Dict Date: 05/01/2012 Dict Time: 02 43 PM    Dictation Date and Time:05/01/2012 14:43:12  Transcription Date and Time:05/01/2012 18:23:28  eScription Dictation id: [id number] Confirmation # :[id number]      DICTATED BY: Nyala Kirchner S [facility] DPMADAM S [facility] DPMD:05/01/2012 14:43:12 T:05/01/2012 18:23:28 CN Job#: [id number]

## 2012-05-03 ENCOUNTER — Encounter (HOSPITAL_BASED_OUTPATIENT_CLINIC_OR_DEPARTMENT_OTHER): Payer: Self-pay | Admitting: Podiatrist

## 2012-05-03 ENCOUNTER — Ambulatory Visit (HOSPITAL_BASED_OUTPATIENT_CLINIC_OR_DEPARTMENT_OTHER): Payer: PRIVATE HEALTH INSURANCE | Admitting: Podiatrist

## 2012-05-03 DIAGNOSIS — M792 Neuralgia and neuritis, unspecified: Secondary | ICD-10-CM

## 2012-05-03 DIAGNOSIS — G9050 Complex regional pain syndrome I, unspecified: Secondary | ICD-10-CM

## 2012-05-03 DIAGNOSIS — S9030XA Contusion of unspecified foot, initial encounter: Secondary | ICD-10-CM

## 2012-05-03 MED ORDER — DEXAMETHASONE SODIUM PHOSPHATE 4 MG/ML IJ SOLN
2.00 mg | Freq: Once | INTRAMUSCULAR | Status: AC
Start: 2012-05-03 — End: 2012-05-03

## 2012-05-03 NOTE — Progress Notes (Signed)
Subjective:   Madison Mckay Is a 41 year old female presenting for f/u on RSD. She complains of pins and needles pain that is the same in presentation since last visit. She states that the recent cold weather has made her symptoms worse.   Objective:   General: A&Ox3, in NAD, plesaant   Lower Extremity Focused Physical Exam:   Vascular: DP and PT pulses are faint b/l.   Neurological: Allodynia present right.   Dermatological: No open wounds b/l. Interspaces clean and dry b/l. Normal turgor and tone b/l. No HKT present b/l. No edema present b/l. Right foot cold and clamy to touch.   Musculoskeletal: Pain with palpation of all regions ankle and below.   Assessment:   1. RSD right.   Plan:   1. Anesthetic block of the common peroneal and tibia nerves, and also ankle were performed today in standard fashion. 4.5ccs of bupivacaine with epi and 0.25cc dexamethasone 4 were injected into each of the aforementioned distributions, respectively. Injections well tolerated without incident.   2. Follow up in a couple of days for further injections for RSD.

## 2012-05-04 MED ORDER — DEXAMETHASONE SODIUM PHOSPHATE 4 MG/ML IJ SOLN
2.00 mg | Freq: Once | INTRAMUSCULAR | Status: AC
Start: 2012-05-04 — End: 2012-05-04

## 2012-05-04 NOTE — Progress Notes (Signed)
Subjective:   Madison Mckay Is a 41 year old female presenting for f/u on RSD. She complains of pins and needles pain that is the same in presentation since last visit. She states that the recent cold weather has made her symptoms worse.   Objective:   General: A&Ox3, in NAD, plesaant   Lower Extremity Focused Physical Exam:   Vascular: DP and PT pulses are faint b/l.   Neurological: Allodynia present right.   Dermatological: No open wounds b/l. Interspaces clean and dry b/l. Normal turgor and tone b/l. No HKT present b/l. No edema present b/l. Right foot cold and clamy to touch.   Musculoskeletal: Pain with palpation of all regions ankle and below.   Assessment:   1. RSD right.   Plan:   1. Anesthetic block of the common peroneal and tibia nerves, and also ankle were performed today in standard fashion. 4.5ccs of bupivacaine with epi and 0.25cc dexamethasone 4 were injected into each of the aforementioned distributions, respectively. Injections well tolerated without incident.   2. Follow up in a couple of days for further injections for RSD.

## 2012-05-08 ENCOUNTER — Ambulatory Visit (HOSPITAL_BASED_OUTPATIENT_CLINIC_OR_DEPARTMENT_OTHER): Payer: PRIVATE HEALTH INSURANCE | Admitting: Podiatrist

## 2012-05-08 ENCOUNTER — Encounter (HOSPITAL_BASED_OUTPATIENT_CLINIC_OR_DEPARTMENT_OTHER): Payer: Self-pay | Admitting: Podiatrist

## 2012-05-08 VITALS — BP 124/82 | HR 92 | Temp 98.6°F

## 2012-05-08 DIAGNOSIS — G9050 Complex regional pain syndrome I, unspecified: Secondary | ICD-10-CM

## 2012-05-08 DIAGNOSIS — S9030XA Contusion of unspecified foot, initial encounter: Secondary | ICD-10-CM

## 2012-05-08 DIAGNOSIS — M792 Neuralgia and neuritis, unspecified: Secondary | ICD-10-CM

## 2012-05-10 ENCOUNTER — Ambulatory Visit (HOSPITAL_BASED_OUTPATIENT_CLINIC_OR_DEPARTMENT_OTHER): Payer: PRIVATE HEALTH INSURANCE | Admitting: Podiatrist

## 2012-05-10 ENCOUNTER — Encounter (HOSPITAL_BASED_OUTPATIENT_CLINIC_OR_DEPARTMENT_OTHER): Payer: Self-pay | Admitting: Podiatrist

## 2012-05-10 DIAGNOSIS — M792 Neuralgia and neuritis, unspecified: Secondary | ICD-10-CM

## 2012-05-10 DIAGNOSIS — S9030XA Contusion of unspecified foot, initial encounter: Secondary | ICD-10-CM

## 2012-05-10 DIAGNOSIS — G9050 Complex regional pain syndrome I, unspecified: Secondary | ICD-10-CM

## 2012-05-10 MED ORDER — DEXAMETHASONE SODIUM PHOSPHATE 4 MG/ML IJ SOLN
2.00 mg | Freq: Once | INTRAMUSCULAR | Status: AC
Start: 2012-05-10 — End: 2012-05-10

## 2012-05-10 NOTE — Progress Notes (Signed)
Subjective:   Madison Mckay Is a 41 year old female presenting for f/u on RSD. She complains of pins and needles pain that is the same in presentation since last visit. She states that the recent cold weather has made her symptoms worse.   Objective:   General: A&Ox3, in NAD, plesaant   Lower Extremity Focused Physical Exam:   Vascular: DP and PT pulses are faint b/l.   Neurological: Allodynia present right.   Dermatological: No open wounds b/l. Interspaces clean and dry b/l. Normal turgor and tone b/l. No HKT present b/l. No edema present b/l. Right foot cold and clamy to touch.   Musculoskeletal: Pain with palpation of all regions ankle and below.   Assessment:   1. RSD right.   Plan:   1. Anesthetic block of the common peroneal and tibia nerves, and also ankle were performed today in standard fashion. 4.5ccs of bupivacaine with epi and 0.25cc dexamethasone 4 were injected into each of the aforementioned distributions, respectively. Injections well tolerated without incident.   2. Follow up in a couple of days for further injections for RSD.

## 2012-05-10 NOTE — Progress Notes (Signed)
Madison Mckay Is a 41 year old female presenting for f/u on RSD. She complains of pins and needles pain that is the same in presentation since last visit. She states that the recent cold weather has made her symptoms worse.   Objective:   General: A&Ox3, in NAD, plesaant   Lower Extremity Focused Physical Exam:   Vascular: DP and PT pulses are faint b/l.   Neurological: Allodynia present right.   Dermatological: No open wounds b/l. Interspaces clean and dry b/l. Normal turgor and tone b/l. No HKT present b/l. No edema present b/l. Right foot cold and clamy to touch.   Musculoskeletal: Pain with palpation of all regions ankle and below.   Assessment:   1. RSD right.   Plan:   1. Anesthetic block of the common peroneal and tibia nerves, and also ankle were performed today in standard fashion. 4.5ccs of bupivacaine with epi and 0.25cc dexamethasone 4 were injected into each of the aforementioned distributions, respectively. Injections well tolerated without incident.   2. Follow up in a couple of days for further injections for RSD.

## 2012-05-12 ENCOUNTER — Ambulatory Visit (HOSPITAL_BASED_OUTPATIENT_CLINIC_OR_DEPARTMENT_OTHER): Payer: PRIVATE HEALTH INSURANCE | Admitting: Podiatrist

## 2012-05-12 ENCOUNTER — Encounter (HOSPITAL_BASED_OUTPATIENT_CLINIC_OR_DEPARTMENT_OTHER): Payer: Self-pay | Admitting: Podiatrist

## 2012-05-12 ENCOUNTER — Encounter (HOSPITAL_BASED_OUTPATIENT_CLINIC_OR_DEPARTMENT_OTHER): Payer: Self-pay

## 2012-05-12 DIAGNOSIS — G9050 Complex regional pain syndrome I, unspecified: Secondary | ICD-10-CM

## 2012-05-12 DIAGNOSIS — M79673 Pain in unspecified foot: Secondary | ICD-10-CM

## 2012-05-15 ENCOUNTER — Ambulatory Visit (HOSPITAL_BASED_OUTPATIENT_CLINIC_OR_DEPARTMENT_OTHER): Payer: Self-pay | Admitting: Podiatrist

## 2012-05-15 DIAGNOSIS — M792 Neuralgia and neuritis, unspecified: Secondary | ICD-10-CM

## 2012-05-15 DIAGNOSIS — G9050 Complex regional pain syndrome I, unspecified: Secondary | ICD-10-CM

## 2012-05-15 NOTE — Progress Notes (Signed)
S:  41 year old female presenting for f/u injection therapy for RSD. Pt denies any significant changes since last clinical visit (05/12/12) and states that her affected RIGHT extremity felt "good" this weekend. She reports continued complaint of pins and needles extending from RIGHT proximal leg into the foot consistent with past visits.  Pt rates level of current discomfort at 7/10.   PMH: reviewed, no significant changes  PSgxH: reviewed, no significant changes   Meds: reviewed, no significant changes   Allergies: reviewed, no significant changes     Objective:   General: AAOx3, NAD   Lower Extremity Focused Exam: RIGHT  Vascular: DP/PT palpable 1/4, CFT<4secs x5 digits, TG WNL, extremity was noted to be warm to touch, no erythema, no edema   Neuro: Allodynia of right extremity   Derm: skin well-hydrated and intact, no open lesions, no interdigital maceration, no erythema, no clinical signs of infection.   MSK: pain to palpation to all aspects of proximal leg extending into foot    Assessment: Reflex Sympathetic Dystrophy, RIGHT    Plan:  -Pt seen and evaluated by provider  -Anesthetic injection block performed with 4.5cc of Bupivicaine with epi and 0.25cc of Dexamethasone 4 mg/mL. Injections performed to the following nerves: tibial nerve, common peroneal, and circumference of ankle. Pt tolerated procedure well and without complications   -Follow up in 2 days (05/18/12) for continued injection therapy for RSD.

## 2012-05-17 ENCOUNTER — Ambulatory Visit (HOSPITAL_BASED_OUTPATIENT_CLINIC_OR_DEPARTMENT_OTHER): Payer: Self-pay | Admitting: Podiatrist

## 2012-05-17 ENCOUNTER — Encounter (HOSPITAL_BASED_OUTPATIENT_CLINIC_OR_DEPARTMENT_OTHER): Payer: Self-pay | Admitting: Podiatrist

## 2012-05-17 DIAGNOSIS — G9050 Complex regional pain syndrome I, unspecified: Secondary | ICD-10-CM

## 2012-05-17 DIAGNOSIS — M792 Neuralgia and neuritis, unspecified: Secondary | ICD-10-CM

## 2012-05-17 MED ORDER — ZOLPIDEM TARTRATE ER 6.25 MG PO TBCR
6.2500 mg | EXTENDED_RELEASE_TABLET | Freq: Every evening | ORAL | Status: AC | PRN
Start: 2012-05-17 — End: 2012-06-16

## 2012-05-17 NOTE — Progress Notes (Signed)
SUBJECTIVE: This is 41 yo F RTC for f/u injection therapy for RSD. Pt denies any significant changes since last clinical visit. Reports the usual pins and needle sensation. Denies any recent trauma. Denies changes to current PMH.  Current pain remains at 7/10.   OBJECTIVE:   General: AAOx3, NAD   Lower Extremity Focused Exam: RIGHT  Vascular: DP/PT palpable 1/4, CFT<4secs x5 digits,   Neuro: Allodynia of right extremity  Derm: skin well-hydrated and intact, no open lesions, no interdigital maceration, no erythema, no clinical signs of infection. No erythema and edema noted.  MSK: pain to palpation to all aspects of proximal leg extending into foot    Assessment: Reflex Sympathetic Dystrophy, RIGHT    Plan:  - Pt was seen and evaluated. All questions were addressed.  -Anesthetic injection block performed with 4.5cc of Bupivicaine with epi and 0.25cc of Dexamethasone 4 mg/mL. Injections performed to the following nerves: tibial nerve, common peroneal, and circumference of ankle. Pt tolerated procedure well and without complications.  - Patient plans to have discussion of laser treatment to RIGHT hallux fungal nail at next visit.  - Rx: Ambien 6.25 mg tabs 1 tab PO q H PRN sleep  -Follow up in 2 days  for continued injection therapy for RSD.

## 2012-05-19 ENCOUNTER — Ambulatory Visit (HOSPITAL_BASED_OUTPATIENT_CLINIC_OR_DEPARTMENT_OTHER): Payer: PRIVATE HEALTH INSURANCE | Admitting: Podiatrist

## 2012-05-19 DIAGNOSIS — S9030XA Contusion of unspecified foot, initial encounter: Secondary | ICD-10-CM

## 2012-05-19 DIAGNOSIS — G9050 Complex regional pain syndrome I, unspecified: Secondary | ICD-10-CM

## 2012-05-19 DIAGNOSIS — M792 Neuralgia and neuritis, unspecified: Secondary | ICD-10-CM

## 2012-05-22 ENCOUNTER — Ambulatory Visit (HOSPITAL_BASED_OUTPATIENT_CLINIC_OR_DEPARTMENT_OTHER): Payer: PRIVATE HEALTH INSURANCE | Admitting: Foot & Ankle Surgery

## 2012-05-22 DIAGNOSIS — IMO0002 Reserved for concepts with insufficient information to code with codable children: Secondary | ICD-10-CM

## 2012-05-22 NOTE — Progress Notes (Signed)
S: 41 year old female presenting for f/u injection therapy for RSD. Pt denies any significant changes since last clinical visit (05/19/12) and states that her affected RIGHT extremity felt ok this weekend, but had some increase in pain due to shoveling of snow and wearing stiff boots. She reports continued complaint of pins and needles extending from RIGHT proximal leg into the foot consistent with past visits. Pt rates level of current discomfort at 8/10. Pt has not yet acquired her RX for Ambien given to her at last visit.     PMH: reviewed, no significant changes  PSgxH: reviewed, no significant changes   Meds: reviewed, no significant changes   Allergies: reviewed, no significant changes     Objective:   General: AAOx3, NAD, pleasant affect  Lower Extremity Focused Exam: RIGHT  Vascular: DP/PT palpable 1/4, CFT<4secs x5 digits, TG WNL, extremity was noted to be warm to touch, no erythema, no edema   Neuro: Allodynia of right extremity   Derm: skin well-hydrated and intact, mild ecchymosis noted to medial malleolar aspect secondary to injection site, no open lesions, no interdigital maceration, no erythema, no clinical signs of infection.   MSK: pain to palpation to all aspects of proximal leg extending into foot    Assessment: Reflex Sympathetic Dystrophy, RIGHT    Plan:  -Pt seen and evaluated by provider  -Anesthetic injection block performed with 4.5cc of Bupivicaine with epi and 0.25cc of Dexamethasone 4 mg/mL. Injections performed to the following nerves: tibial nerve, common peroneal, and circumference of ankle. Pt tolerated procedure well and without complications   -Follow up in 2 days for continued injection therapy for RSD.

## 2012-05-23 ENCOUNTER — Encounter (HOSPITAL_BASED_OUTPATIENT_CLINIC_OR_DEPARTMENT_OTHER): Payer: Self-pay | Admitting: Foot & Ankle Surgery

## 2012-05-23 NOTE — Progress Notes (Signed)
41 year old female presents for followup of complex regional pain syndrome, right lower extremity. She complains of swelling and pain at her foot and ankle.     Past medical history, medications, allergies, social and family history were reviewed with the patient.    REVIEW OF SYSTEMS:  Painful right lower extremity.    PHYSICAL EXAMINATION:  Patient is A and O x3, in mild distress.  Right foot is cooler.  The skin is pale.  No open wounds.+ Leg hair. No open wounds.    ASSESSMENT:  Complex regional pain syndrome, right.    TREATMENT:  After confirming the patient's name, date of birth, correct foot and location, I performed a common peroneal block followed by an ankle block utilizing a total of 15 mL of 0.5% Marcaine with epinephrine and 1 mL of Decadron 4 mg/mL.  She did have good pain relief after the injection.  F/U with Dr. Rosilyn Mings next week.        Foot Pain

## 2012-05-24 ENCOUNTER — Ambulatory Visit (HOSPITAL_BASED_OUTPATIENT_CLINIC_OR_DEPARTMENT_OTHER): Payer: PRIVATE HEALTH INSURANCE | Admitting: Podiatrist

## 2012-05-24 VITALS — BP 132/93 | HR 85 | Temp 98.5°F

## 2012-05-24 DIAGNOSIS — M792 Neuralgia and neuritis, unspecified: Secondary | ICD-10-CM

## 2012-05-24 DIAGNOSIS — S9030XA Contusion of unspecified foot, initial encounter: Secondary | ICD-10-CM

## 2012-05-24 DIAGNOSIS — G9050 Complex regional pain syndrome I, unspecified: Secondary | ICD-10-CM

## 2012-05-24 MED ORDER — OXYCODONE HCL ER 10 MG PO TB12
10.0000 mg | ORAL_TABLET | Freq: Every day | ORAL | Status: AC
Start: 2012-05-24 — End: 2012-06-23

## 2012-05-24 MED ORDER — TAPENTADOL HCL ER 150 MG PO TB12
1.0000 | ORAL_TABLET | Freq: Four times a day (QID) | ORAL | Status: AC
Start: 2012-05-24 — End: 2013-10-12

## 2012-05-24 MED ORDER — DIAZEPAM 5 MG PO TABS
5.0000 mg | ORAL_TABLET | Freq: Two times a day (BID) | ORAL | Status: DC | PRN
Start: 2012-05-24 — End: 2012-12-01

## 2012-05-24 NOTE — Progress Notes (Signed)
S: Madison Mckay returns to clinic for her multiple times weekly injections for RSD in her right leg which has been on going for over 2 years and has failed multiple treatment modalities. She states that her leg is much more painful today than it has been in a long time. She attributes this to possibly walking in the snow over the last 24 hours. 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 She feels that he current boots do not immobilize her foot well and would like to have more support.    ROS: No other complaints today.    EXAM:  Gen: NAD, A&Ox3  Right extremity: Warm to touch with no discoloration or aberrations in temperature. Pedal pulses are palpable. There are areas of ecchymosis from previous injections at her ankle and fibular neck and the foot is very sensitive to touch. There is no significant pedal deformity although there is some mild atrophy of the musculature.    ASSESSMENT: Reflex Sympathetic Dystrophy, RIGHT  PLAN:  -Anesthetic injection block performed with 4.5cc of Bupivicaine with epi and 0.25cc of Dexamethasone 4 mg/mL. Injections performed to the following nerves: tibial nerve, common peroneal, and circumference of ankle. Pt tolerated procedure well and without complications   - Fit and dispensed both ankle brace and nightsplint for improved immobilization  - Dispensed ACE wrap, ABD pads and strapping  - Rx for Valium, Nucynta and oxycodone were dispensed  - Follow up for continued injection therapy for RSD.

## 2012-05-26 ENCOUNTER — Ambulatory Visit (HOSPITAL_BASED_OUTPATIENT_CLINIC_OR_DEPARTMENT_OTHER): Payer: PRIVATE HEALTH INSURANCE | Admitting: Podiatrist

## 2012-05-26 DIAGNOSIS — M792 Neuralgia and neuritis, unspecified: Secondary | ICD-10-CM

## 2012-05-26 DIAGNOSIS — S9030XA Contusion of unspecified foot, initial encounter: Secondary | ICD-10-CM

## 2012-05-26 DIAGNOSIS — G9050 Complex regional pain syndrome I, unspecified: Secondary | ICD-10-CM

## 2012-05-26 NOTE — Progress Notes (Signed)
Patient seen with student and personally examined by me.

## 2012-05-29 ENCOUNTER — Ambulatory Visit (HOSPITAL_BASED_OUTPATIENT_CLINIC_OR_DEPARTMENT_OTHER): Payer: Self-pay | Admitting: Foot & Ankle Surgery

## 2012-05-29 ENCOUNTER — Ambulatory Visit (HOSPITAL_BASED_OUTPATIENT_CLINIC_OR_DEPARTMENT_OTHER): Payer: PRIVATE HEALTH INSURANCE | Admitting: Podiatrist

## 2012-05-29 DIAGNOSIS — G9050 Complex regional pain syndrome I, unspecified: Secondary | ICD-10-CM

## 2012-05-29 DIAGNOSIS — S9030XA Contusion of unspecified foot, initial encounter: Secondary | ICD-10-CM

## 2012-05-29 DIAGNOSIS — M792 Neuralgia and neuritis, unspecified: Secondary | ICD-10-CM

## 2012-05-29 NOTE — Progress Notes (Signed)
S: 41 year old female presenting for f/u injection therapy for RSD. Pt reports feeling intensed pain today. 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 Since then the pain is constant 9/10 pain. She also noticed increased swelling to her ankle. Denies any recent trauma. Denies any changes to current PMH.  Objective:   General: AAOx3, NAD   Lower Extremity Focused Exam: RIGHT  Vascular: DP/PT palpable 1/4, CFT<4secs x5 digits, TG WNL, extremity was noted to be warm to touch, no erythema.   Neuro: Allodynia of right extremity   Derm: Mild edema noted to ankle with slight ecchymosis noted anteriorly. Skin well-hydrated and intact, no open lesions, no interdigital maceration, no erythema, no clinical signs of infection.   MSK: pain to palpation to all aspects of proximal leg extending into foot  Assessment: Reflex Sympathetic Dystrophy, RIGHT  Plan:  -Pt seen and evaluated by provider  -Anesthetic injection block performed with 4.5cc of Bupivicaine with epi and 0.25cc of Dexamethasone 4 mg/mL. Injections performed to the following nerves: tibial nerve, common peroneal, and circumference of ankle. Pt tolerated procedure well and without complications.  - Dispensed to patient tall cam walking boot.   - Discussed with patient possible footwear available.  -Follow up in 2 days  for continued injection therapy for RSD.

## 2012-05-30 DIAGNOSIS — M79671 Pain in right foot: Secondary | ICD-10-CM | POA: Insufficient documentation

## 2012-05-30 MED ORDER — DEXAMETHASONE SODIUM PHOSPHATE 4 MG/ML IJ SOLN
2.00 mg | Freq: Once | INTRAMUSCULAR | Status: AC
Start: 2012-05-30 — End: 2012-05-30

## 2012-05-30 NOTE — Progress Notes (Signed)
Madison Mckay Is a 41 year old female presenting for f/u on RSD. She complains of pins and needles pain that is the same in presentation since last visit. She states that the recent cold weather has made her symptoms worse.   Objective:   General: A&Ox3, in NAD, plesaant   Lower Extremity Focused Physical Exam:   Vascular: DP and PT pulses are faint b/l.   Neurological: Allodynia present right.   Dermatological: No open wounds b/l. Interspaces clean and dry b/l. Normal turgor and tone b/l. No HKT present b/l. No edema present b/l. Right foot cold and clamy to touch.   Musculoskeletal: Pain with palpation of all regions ankle and below.   Assessment:   1. RSD right.   Plan:   1. Anesthetic block of the common peroneal and tibia nerves, and also ankle were performed today in standard fashion. 4.5ccs of bupivacaine with epi and 0.25cc dexamethasone 4 were injected into each of the aforementioned distributions, respectively. Injections well tolerated without incident.   2. Follow up in a couple of days for further injections for RSD.

## 2012-05-30 NOTE — Progress Notes (Signed)
S:  41 year old female presenting for f/u injection therapy for RSD. Pt denies any significant changes since last clinical visit (05/12/12) and states that her affected RIGHT extremity felt "good" this weekend. She reports continued complaint of pins and needles extending from RIGHT proximal leg into the foot consistent with past visits.  Pt rates level of current discomfort at 7/10.   PMH: reviewed, no significant changes  PSgxH: reviewed, no significant changes   Meds: reviewed, no significant changes   Allergies: reviewed, no significant changes     Objective:   General: AAOx3, NAD   Lower Extremity Focused Exam: RIGHT  Vascular: DP/PT palpable 1/4, CFT<4secs x5 digits, TG WNL, extremity was noted to be warm to touch, no erythema, no edema   Neuro: Allodynia of right extremity   Derm: skin well-hydrated and intact, no open lesions, no interdigital maceration, no erythema, no clinical signs of infection.   MSK: pain to palpation to all aspects of proximal leg extending into foot    Assessment: Reflex Sympathetic Dystrophy, RIGHT    Plan:  -Pt seen and evaluated by provider  -Anesthetic injection block performed with 4.5cc of Bupivicaine with epi and 0.25cc of Dexamethasone 4 mg/mL. Injections performed to the following nerves: tibial nerve, common peroneal, and circumference of ankle. Pt tolerated procedure well and without complications   -Follow up in 2 days (05/18/12) for continued injection therapy for RSD.

## 2012-05-31 MED ORDER — DEXAMETHASONE SODIUM PHOSPHATE 4 MG/ML IJ SOLN
2.00 mg | Freq: Once | INTRAMUSCULAR | Status: AC
Start: 2012-06-01 — End: 2012-06-01

## 2012-05-31 NOTE — Progress Notes (Signed)
SUBJECTIVE: This is 41 yo F RTC for f/u injection therapy for RSD. Pt denies any significant changes since last clinical visit. Reports the usual pins and needle sensation. Denies any recent trauma. Denies changes to current PMH.  Current pain remains at 7/10.   OBJECTIVE:   General: AAOx3, NAD   Lower Extremity Focused Exam: RIGHT  Vascular: DP/PT palpable 1/4, CFT<4secs x5 digits,   Neuro: Allodynia of right extremity  Derm: skin well-hydrated and intact, no open lesions, no interdigital maceration, no erythema, no clinical signs of infection. No erythema and edema noted.  MSK: pain to palpation to all aspects of proximal leg extending into foot    Assessment: Reflex Sympathetic Dystrophy, RIGHT    Plan:  - Pt was seen and evaluated. All questions were addressed.  -Anesthetic injection block performed with 4.5cc of Bupivicaine with epi and 0.25cc of Dexamethasone 4 mg/mL. Injections performed to the following nerves: tibial nerve, common peroneal, and circumference of ankle. Pt tolerated procedure well and without complications.  - Patient plans to have discussion of laser treatment to RIGHT hallux fungal nail at next visit.  - Rx: Ambien 6.25 mg tabs 1 tab PO q H PRN sleep  -Follow up in 2 days  for continued injection therapy for RSD.

## 2012-06-01 ENCOUNTER — Ambulatory Visit (HOSPITAL_BASED_OUTPATIENT_CLINIC_OR_DEPARTMENT_OTHER): Payer: PRIVATE HEALTH INSURANCE | Admitting: Podiatrist

## 2012-06-01 DIAGNOSIS — S9030XA Contusion of unspecified foot, initial encounter: Secondary | ICD-10-CM

## 2012-06-01 DIAGNOSIS — M792 Neuralgia and neuritis, unspecified: Secondary | ICD-10-CM

## 2012-06-01 DIAGNOSIS — G9050 Complex regional pain syndrome I, unspecified: Secondary | ICD-10-CM

## 2012-06-01 LAB — XR FOOT RIGHT MINIMUM 3 VIEWS

## 2012-06-02 MED ORDER — DEXAMETHASONE SODIUM PHOSPHATE 4 MG/ML IJ SOLN
2.00 mg | Freq: Once | INTRAMUSCULAR | Status: AC
Start: 2012-06-02 — End: 2012-06-02

## 2012-06-02 NOTE — Progress Notes (Signed)
Subjective:   Madison Mckay Is a 41 year old female presenting for f/u on RSD. She complains of pins and needles pain that is the same in presentation since last visit. She states that the recent cold weather has made her symptoms worse.   Objective:   General: A&Ox3, in NAD, plesaant   Lower Extremity Focused Physical Exam:   Vascular: DP and PT pulses are faint b/l.   Neurological: Allodynia present right.   Dermatological: No open wounds b/l. Interspaces clean and dry b/l. Normal turgor and tone b/l. No HKT present b/l. No edema present b/l. Right foot cold and clamy to touch.   Musculoskeletal: Pain with palpation of all regions ankle and below.   Assessment:   1. RSD right.   Plan:   1. Anesthetic block of the common peroneal and tibia nerves, and also ankle were performed today in standard fashion. 4.5ccs of bupivacaine with epi and 0.25cc dexamethasone 4 were injected into each of the aforementioned distributions, respectively. Injections well tolerated without incident.   2. Follow up in a couple of days for further injections for RSD.

## 2012-06-03 MED ORDER — DEXAMETHASONE SODIUM PHOSPHATE 4 MG/ML IJ SOLN
2.00 mg | Freq: Once | INTRAMUSCULAR | Status: AC
Start: 2012-06-03 — End: 2012-06-03

## 2012-06-03 NOTE — Progress Notes (Signed)
Patient was seen with resident Dr. Buchanan.  I concur with their assessment and treatment plan.

## 2012-06-04 MED ORDER — DEXAMETHASONE SODIUM PHOSPHATE 4 MG/ML IJ SOLN
2.00 mg | Freq: Once | INTRAMUSCULAR | Status: AC
Start: 2012-06-04 — End: 2012-06-04

## 2012-06-04 NOTE — Progress Notes (Signed)
Subjective:   Madison Mckay Is a 41 year old female presenting for f/u on RSD. She complains of pins and needles pain that is the same in presentation since last visit. She states that the recent cold weather has made her symptoms worse. She also reports that she smashed her 2-4 toes of the right foot into some stairs.  She now has a black and blue area at the base of the 2nd and 3rd toes, and large amounts of pain in the area.  No breaks in the skin are seen.    Objective:   General: A&Ox3, in NAD, pleseant   Lower Extremity Focused Physical Exam:   Vascular: DP and PT pulses are faint b/l.   Neurological: Allodynia present right.   Dermatological: No open wounds b/l. Interspaces clean and dry b/l. Normal turgor and tone b/l. No HKT present b/l. No edema present b/l. Right foot cold and clamy to touch.   Musculoskeletal: Pain with palpation of all regions ankle and below.   Severe pain near base of 2nd and 3rd toes, right foot.    X-rays ordered today.  These show no evidence of fracture or dislocation on the ap/lat/obl views.    Assessment:   1. RSD right.   2.  Contusion, right forefoot, involving toes 2-4, right.    Plan:   Continued nerve blocks with the Bupivicaine/Dexamethasone mix used previously.  Stiff-soled shoes as needed for recent toe pain.  F/u in 2-3 days.    1. Anesthetic block of the common peroneal and tibia nerves, and also ankle were performed today in standard fashion. 4.5ccs of bupivacaine with epi and 0.25cc dexamethasone 4 were injected into each of the aforementioned distributions, respectively. Injections well tolerated without incident.   2. Follow up in a couple of days for further injections for RSD.

## 2012-06-04 NOTE — Progress Notes (Signed)
S: 41 year old female presenting for f/u injection therapy for RSD. Pt reports feeling intensed pain today. 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 Since then the pain is constant 9/10 pain. She also noticed increased swelling to her ankle. Denies any recent trauma. Denies any changes to current PMH.  Objective:   General: AAOx3, NAD   Lower Extremity Focused Exam: RIGHT  Vascular: DP/PT palpable 1/4, CFT<4secs x5 digits, TG WNL, extremity was noted to be warm to touch, no erythema.   Neuro: Allodynia of right extremity   Derm: Mild edema noted to ankle with slight ecchymosis noted anteriorly. Skin well-hydrated and intact, no open lesions, no interdigital maceration, no erythema, no clinical signs of infection.   MSK: pain to palpation to all aspects of proximal leg extending into foot  Assessment: Reflex Sympathetic Dystrophy, RIGHT  Plan:  -Pt seen and evaluated by provider  -Anesthetic injection block performed with 4.5cc of Bupivicaine with epi and 0.25cc of Dexamethasone 4 mg/mL. Injections performed to the following nerves: tibial nerve, common peroneal, and circumference of ankle. Pt tolerated procedure well and without complications.  - Dispensed to patient tall cam walking boot.   - Discussed with patient possible footwear available.  -Follow up in 2 days  for continued injection therapy for RSD.

## 2012-06-06 MED ORDER — DEXAMETHASONE SODIUM PHOSPHATE 4 MG/ML IJ SOLN
2.00 mg | Freq: Once | INTRAMUSCULAR | Status: AC
Start: 2012-06-06 — End: 2012-06-06

## 2012-06-06 NOTE — Progress Notes (Signed)
Subjective:   Madison Mckay Is a 41 year old female presenting for f/u on RSD. She complains of pins and needles pain that is the same in presentation since last visit. She states that the recent cold weather has made her symptoms worse.   Objective:   General: A&Ox3, in NAD, plesaant   Lower Extremity Focused Physical Exam:   Vascular: DP and PT pulses are faint b/l.   Neurological: Allodynia present right.   Dermatological: No open wounds b/l. Interspaces clean and dry b/l. Normal turgor and tone b/l. No HKT present b/l. No edema present b/l. Right foot cold and clamy to touch.   Musculoskeletal: Pain with palpation of all regions ankle and below.   Assessment:   1. RSD right.   Plan:   1. Anesthetic block of the common peroneal and tibia nerves, and also ankle were performed today in standard fashion. 4.5ccs of bupivacaine with epi and 0.25cc dexamethasone 4 were injected into each of the aforementioned distributions, respectively. Injections well tolerated without incident.   2. Follow up in a couple of days for further injections for RSD.

## 2012-06-09 ENCOUNTER — Ambulatory Visit (HOSPITAL_BASED_OUTPATIENT_CLINIC_OR_DEPARTMENT_OTHER): Payer: Self-pay | Admitting: Podiatrist

## 2012-06-12 ENCOUNTER — Ambulatory Visit (HOSPITAL_BASED_OUTPATIENT_CLINIC_OR_DEPARTMENT_OTHER): Payer: PRIVATE HEALTH INSURANCE | Admitting: Podiatrist

## 2012-06-12 DIAGNOSIS — M792 Neuralgia and neuritis, unspecified: Secondary | ICD-10-CM

## 2012-06-12 DIAGNOSIS — G9050 Complex regional pain syndrome I, unspecified: Secondary | ICD-10-CM

## 2012-06-12 DIAGNOSIS — M79673 Pain in unspecified foot: Secondary | ICD-10-CM

## 2012-06-14 ENCOUNTER — Ambulatory Visit (HOSPITAL_BASED_OUTPATIENT_CLINIC_OR_DEPARTMENT_OTHER): Payer: PRIVATE HEALTH INSURANCE | Admitting: Podiatrist

## 2012-06-16 ENCOUNTER — Ambulatory Visit (HOSPITAL_BASED_OUTPATIENT_CLINIC_OR_DEPARTMENT_OTHER): Payer: PRIVATE HEALTH INSURANCE | Admitting: Podiatrist

## 2012-06-16 ENCOUNTER — Encounter (HOSPITAL_BASED_OUTPATIENT_CLINIC_OR_DEPARTMENT_OTHER): Payer: Self-pay | Admitting: Podiatrist

## 2012-06-16 DIAGNOSIS — M79673 Pain in unspecified foot: Secondary | ICD-10-CM

## 2012-06-16 DIAGNOSIS — S9030XA Contusion of unspecified foot, initial encounter: Secondary | ICD-10-CM

## 2012-06-16 DIAGNOSIS — G9050 Complex regional pain syndrome I, unspecified: Secondary | ICD-10-CM

## 2012-06-16 MED ORDER — DEXAMETHASONE SODIUM PHOSPHATE 4 MG/ML IJ SOLN
2.00 mg | Freq: Once | INTRAMUSCULAR | Status: AC
Start: 2012-06-16 — End: 2012-06-16

## 2012-06-16 NOTE — Progress Notes (Signed)
Date of Service: 06/16/2012    SUBJECTIVE:  The patient presents to the office today for followup for pain in her right foot and lower leg.  She has been able to successfully spread out her injections and is now able to tolerate approximately 2 per week instead of the 3 per week that were required previously.  She appears to be doing much better overall and has even just completed an 8 day stretch without injections. She did report that this morning and yesterday, her pain was quite severe again, and so we will attempt to see how she responds to 2 days a week, and we will follow up with her next week for continued treatment.    PAST MEDICAL HISTORY:  Reviewed and is otherwise unchanged.    ___________________________  Reviewed and Electronically Signed By: Naoma Diener DPM  Sig Date: 07/04/2012  Sig Time: 12:52:33  Dictated By: Naoma Diener DPM  Dict Date: 06/16/2012 Dict Time: 03 29 PM    Dictation Date and Time:06/16/2012 15:29:44  Transcription Date and Time:06/16/2012 18:56:26  eScription Dictation id: [id number] Confirmation # :[id number]      DICTATED BY: Essa Malachi S Jaileen Janelle DPMADAM S [facility] DPMD:06/16/2012 15:29:44 T:06/16/2012 18:56:26 VN Job#: [id number]

## 2012-06-16 NOTE — Progress Notes (Signed)
This office note has been dictated. Account number [id number]

## 2012-06-17 MED ORDER — DEXAMETHASONE SODIUM PHOSPHATE 4 MG/ML IJ SOLN
2.00 mg | Freq: Once | INTRAMUSCULAR | Status: AC
Start: 2012-06-17 — End: 2012-06-17

## 2012-06-17 NOTE — Progress Notes (Signed)
Pt seen today for continued pain from RSD and contusion of foot.  She has not had an injection in 8 days, is quite uncomfortable, but feels like she is also a little improved.  Today, we have administered nerved blocks again, to common peroneal, ankle, and posterior tibial nerves with 1/2% bupivicaine with epi (4ml), and 1/4cc of Dexamethasone (4mg /ml) to each site.  She tolerated the injection without any complicatoins.  F/u in 5 days.

## 2012-06-19 ENCOUNTER — Ambulatory Visit (HOSPITAL_BASED_OUTPATIENT_CLINIC_OR_DEPARTMENT_OTHER): Payer: PRIVATE HEALTH INSURANCE | Admitting: Podiatrist

## 2012-06-19 DIAGNOSIS — S9030XA Contusion of unspecified foot, initial encounter: Secondary | ICD-10-CM

## 2012-06-19 DIAGNOSIS — G9050 Complex regional pain syndrome I, unspecified: Secondary | ICD-10-CM

## 2012-06-19 LAB — XR ANKLE RIGHT MINIMUM 3 VIEWS

## 2012-06-19 LAB — XR FOOT RIGHT MINIMUM 3 VIEWS

## 2012-06-19 MED ORDER — DEXAMETHASONE SODIUM PHOSPHATE 4 MG/ML IJ SOLN
2.00 mg | Freq: Once | INTRAMUSCULAR | Status: AC
Start: 2012-06-19 — End: 2012-06-19

## 2012-06-19 NOTE — Addendum Note (Signed)
Addended byRosilyn Mings, Karlo Goeden on: 06/19/2012 12:22 PM     Modules accepted: Orders

## 2012-06-23 ENCOUNTER — Ambulatory Visit (HOSPITAL_BASED_OUTPATIENT_CLINIC_OR_DEPARTMENT_OTHER): Payer: Self-pay | Admitting: Podiatrist

## 2012-06-26 MED ORDER — DEXAMETHASONE SODIUM PHOSPHATE 4 MG/ML IJ SOLN
2.00 mg | Freq: Once | INTRAMUSCULAR | Status: AC
Start: 2012-06-26 — End: 2012-06-26

## 2012-06-26 NOTE — Progress Notes (Signed)
Pt reports that she had a contusion of the Right foot this week, while climbing stairs.  Since then, the pain has increased significantly, and te foot is colder and more mottled in appearance then last week.   Pt also reports pain in the ankle region.  No evidence of fracture at this time, based on xrays taken today.    Today, we have administered nerved blocks again, to common peroneal, ankle, and posterior tibial nerves with 1/2% bupivicaine with epi (4ml), and 1/4cc of Dexamethasone (4mg /ml) to each site. She tolerated the injection without any complications. F/u in 5 days.

## 2012-06-28 ENCOUNTER — Ambulatory Visit (HOSPITAL_BASED_OUTPATIENT_CLINIC_OR_DEPARTMENT_OTHER): Payer: Self-pay | Admitting: Podiatrist

## 2012-06-28 ENCOUNTER — Ambulatory Visit (HOSPITAL_BASED_OUTPATIENT_CLINIC_OR_DEPARTMENT_OTHER): Payer: PRIVATE HEALTH INSURANCE | Admitting: Podiatrist

## 2012-06-28 DIAGNOSIS — M792 Neuralgia and neuritis, unspecified: Secondary | ICD-10-CM

## 2012-06-28 DIAGNOSIS — G9050 Complex regional pain syndrome I, unspecified: Secondary | ICD-10-CM

## 2012-06-28 MED ORDER — CYCLOBENZAPRINE HCL 10 MG PO TABS
10.0000 mg | ORAL_TABLET | Freq: Two times a day (BID) | ORAL | Status: DC | PRN
Start: 2012-06-28 — End: 2012-10-23

## 2012-06-28 MED ORDER — ZOLPIDEM TARTRATE 5 MG PO TABS
5.0000 mg | ORAL_TABLET | Freq: Every evening | ORAL | Status: DC | PRN
Start: 2012-06-28 — End: 2012-12-01

## 2012-06-29 ENCOUNTER — Ambulatory Visit (HOSPITAL_BASED_OUTPATIENT_CLINIC_OR_DEPARTMENT_OTHER): Payer: Self-pay | Admitting: Internal Medicine

## 2012-06-30 ENCOUNTER — Ambulatory Visit (HOSPITAL_BASED_OUTPATIENT_CLINIC_OR_DEPARTMENT_OTHER): Payer: PRIVATE HEALTH INSURANCE | Admitting: Podiatrist

## 2012-06-30 DIAGNOSIS — G9050 Complex regional pain syndrome I, unspecified: Secondary | ICD-10-CM

## 2012-06-30 DIAGNOSIS — M79673 Pain in unspecified foot: Secondary | ICD-10-CM

## 2012-07-02 MED ORDER — DEXAMETHASONE SODIUM PHOSPHATE 4 MG/ML IJ SOLN
2.00 mg | Freq: Once | INTRAMUSCULAR | Status: AC
Start: 2012-07-02 — End: 2012-07-02

## 2012-07-02 NOTE — Progress Notes (Signed)
Pt presents today for f/u for RSD.  She has been making progress in spreading out her injections, and her contusion pain appears to have decreased.  Today, we have administered nerved blocks again, to common peroneal, ankle, and posterior tibial nerves with 1/2% bupivicaine with epi (4ml), and 1/4cc of Dexamethasone (4mg /ml) to each site. She tolerated the injection without any complications. F/u in 5 days.

## 2012-07-03 ENCOUNTER — Ambulatory Visit (HOSPITAL_BASED_OUTPATIENT_CLINIC_OR_DEPARTMENT_OTHER): Payer: PRIVATE HEALTH INSURANCE | Admitting: Podiatrist

## 2012-07-03 MED ORDER — DEXAMETHASONE SODIUM PHOSPHATE 4 MG/ML IJ SOLN
2.00 mg | Freq: Once | INTRAMUSCULAR | Status: AC
Start: 2012-07-03 — End: 2012-07-03

## 2012-07-03 NOTE — Progress Notes (Signed)
Pt presents for f/u for RSD and has recently been able to reduce injections to 2 times per week, but with some significant discomfort.  PMH otherwise unchanged.      Today, we will try a long-acting Bupivicaine in conjunction with dexamethasone to three sites (common peroneal, ankle, and posterior tibial nerves) on the right leg. Pt tolerated injections without complications.

## 2012-07-04 NOTE — Progress Notes (Signed)
I concur with assessment.

## 2012-07-05 ENCOUNTER — Ambulatory Visit (HOSPITAL_BASED_OUTPATIENT_CLINIC_OR_DEPARTMENT_OTHER): Payer: Self-pay | Admitting: Podiatrist

## 2012-07-07 ENCOUNTER — Ambulatory Visit (HOSPITAL_BASED_OUTPATIENT_CLINIC_OR_DEPARTMENT_OTHER): Payer: PRIVATE HEALTH INSURANCE | Admitting: Podiatrist

## 2012-07-07 VITALS — BP 129/85 | HR 90 | Temp 98.4°F

## 2012-07-07 DIAGNOSIS — G9050 Complex regional pain syndrome I, unspecified: Secondary | ICD-10-CM

## 2012-07-07 DIAGNOSIS — M792 Neuralgia and neuritis, unspecified: Secondary | ICD-10-CM

## 2012-07-09 NOTE — Progress Notes (Signed)
This office note has been dictated. Account number [id number]

## 2012-07-09 NOTE — Progress Notes (Signed)
Date of Service: 07/07/2012    SUBJECTIVE:  The patient presents to the office today for followup for reflex sympathetic dystrophy pain in the right foot and lower leg.  At her last visit, we gave her a long-acting bupivacaine injection and she continues to have near total relief.  She indicates that she has suddenly been able to sleep much better; and she denies fever, chills, nausea, vomiting, night sweats, and shortness of breath.  She has no evidence of infection.  Overall, she is extremely happy due to the relief that she is experiencing and we will follow up with her on a p.r.n. basis in approximately 1 week.    ___________________________  Reviewed and Electronically Signed By: Naoma Diener DPM  Sig Date: 08/04/2012  Sig Time: 13:34:40  Dictated By: Naoma Diener DPM  Dict Date: 07/09/2012 Dict Time: 04 34 PM    Dictation Date and Time:07/09/2012 16:34:51  Transcription Date and Time:07/09/2012 17:13:03  eScription Dictation id: [id number] Confirmation # :[id number]      DICTATED BY: Harvis Mabus S [facility] DPMADAM S [facility] DPMD:07/09/2012 16:34:51 T:07/09/2012 17:13:03 JN Job#: [id number]

## 2012-07-10 ENCOUNTER — Ambulatory Visit (HOSPITAL_BASED_OUTPATIENT_CLINIC_OR_DEPARTMENT_OTHER): Payer: PRIVATE HEALTH INSURANCE | Admitting: Podiatrist

## 2012-07-12 ENCOUNTER — Ambulatory Visit (HOSPITAL_BASED_OUTPATIENT_CLINIC_OR_DEPARTMENT_OTHER): Payer: Self-pay | Admitting: Podiatrist

## 2012-07-13 ENCOUNTER — Ambulatory Visit (HOSPITAL_BASED_OUTPATIENT_CLINIC_OR_DEPARTMENT_OTHER): Payer: PRIVATE HEALTH INSURANCE | Admitting: Podiatrist

## 2012-07-13 DIAGNOSIS — M792 Neuralgia and neuritis, unspecified: Secondary | ICD-10-CM

## 2012-07-13 DIAGNOSIS — G9050 Complex regional pain syndrome I, unspecified: Secondary | ICD-10-CM

## 2012-07-17 ENCOUNTER — Ambulatory Visit (HOSPITAL_BASED_OUTPATIENT_CLINIC_OR_DEPARTMENT_OTHER): Payer: PRIVATE HEALTH INSURANCE | Admitting: Podiatrist

## 2012-07-17 VITALS — BP 143/98 | HR 76 | Temp 99.2°F

## 2012-07-17 DIAGNOSIS — G9050 Complex regional pain syndrome I, unspecified: Secondary | ICD-10-CM

## 2012-07-17 DIAGNOSIS — S9030XA Contusion of unspecified foot, initial encounter: Secondary | ICD-10-CM

## 2012-07-17 DIAGNOSIS — M792 Neuralgia and neuritis, unspecified: Secondary | ICD-10-CM

## 2012-07-17 MED ORDER — DEXAMETHASONE SODIUM PHOSPHATE 4 MG/ML IJ SOLN
2.00 mg | Freq: Once | INTRAMUSCULAR | Status: AC
Start: 2012-07-17 — End: 2012-07-17

## 2012-07-17 NOTE — Progress Notes (Signed)
This office note has been dictated. Account number [id number]

## 2012-07-17 NOTE — Progress Notes (Signed)
Date of Service: 07/13/2012    SUBJECTIVE:  The patient presents to the office today for followup for reflex sympathetic dystrophy following a contusion at work to her foot over a year ago.  At her last visit, we attempted treatment with Exparel, which is a long-acting bupivacaine liposome injectable.  She reported approximately 10 days of relief with this, and overall dramatic improvement in symptoms.  However, she presents today with a return of most of her symptoms.      PLAN:  This medication is not available to [place] today, so we will perform our regular standard 3 site injection with bupivacaine with epinephrine and dexamethasone.  She tolerated the injections well without any complications noted.  She will return to clinic as needed for recurrence of pain.    ___________________________  Reviewed and Electronically Signed By: Naoma Diener DPM  Sig Date: 08/04/2012  Sig Time: 13:35:31  Dictated By: Naoma Diener DPM  Dict Date: 07/17/2012 Dict Time: 11 11 AM    Dictation Date and Time:07/17/2012 11:11:48  Transcription Date and Time:07/17/2012 12:59:47  eScription Dictation id: [id number] Confirmation # :[id number]      DICTATED BY: Niles Ess S [facility] DPMADAM S [facility] DPMD:07/17/2012 11:11:48 T:07/17/2012 12:59:47 CN Job#: [id number]

## 2012-07-19 ENCOUNTER — Ambulatory Visit (HOSPITAL_BASED_OUTPATIENT_CLINIC_OR_DEPARTMENT_OTHER): Payer: PRIVATE HEALTH INSURANCE | Admitting: Podiatrist

## 2012-07-21 NOTE — Progress Notes (Signed)
Note previously reviewed.

## 2012-07-24 MED ORDER — BUPIVACAINE LIPOSOME 1.3 % IJ SUSP
1.0000 mL | Freq: Once | INTRAMUSCULAR | Status: DC
Start: 2012-07-24 — End: 2014-01-16

## 2012-07-24 NOTE — Progress Notes (Addendum)
Nerve blocks were performed today with Exparel liposome bupivicaine with 1/4cc dexamethsone, to common peroneal, posterior tibial, and ankle.  She tolerated the injections well without complications.  Plan is to re-evaluate weekly, to see if there is a benefit to the longer acting formulation.      PMH is otherwise unchanged.  She continues to have severe pain in the Right foot and lower leg.

## 2012-07-25 MED ORDER — DEXAMETHASONE SODIUM PHOSPHATE 4 MG/ML IJ SOLN
2.00 mg | Freq: Once | INTRAMUSCULAR | Status: AC
Start: 2012-07-25 — End: 2012-07-25

## 2012-07-25 NOTE — Addendum Note (Signed)
Addended byRosilyn Mings, Srikar Chiang on: 07/25/2012 09:53 AM     Modules accepted: Orders

## 2012-08-03 ENCOUNTER — Ambulatory Visit (HOSPITAL_BASED_OUTPATIENT_CLINIC_OR_DEPARTMENT_OTHER): Payer: PRIVATE HEALTH INSURANCE | Admitting: Podiatrist

## 2012-08-03 ENCOUNTER — Encounter (HOSPITAL_BASED_OUTPATIENT_CLINIC_OR_DEPARTMENT_OTHER): Payer: Self-pay | Admitting: Podiatrist

## 2012-08-03 VITALS — BP 126/87 | Temp 99.4°F

## 2012-08-03 DIAGNOSIS — G9050 Complex regional pain syndrome I, unspecified: Secondary | ICD-10-CM

## 2012-08-03 DIAGNOSIS — S9030XA Contusion of unspecified foot, initial encounter: Secondary | ICD-10-CM

## 2012-08-06 MED ORDER — DEXAMETHASONE SODIUM PHOSPHATE 4 MG/ML IJ SOLN
2.00 mg | Freq: Once | INTRAMUSCULAR | Status: AC
Start: 2012-08-06 — End: 2012-08-06

## 2012-08-06 MED ORDER — BUPIVACAINE LIPOSOME 1.3 % IJ SUSP
1.0000 mL | Freq: Once | INTRAMUSCULAR | Status: DC
Start: 2012-08-06 — End: 2014-01-16

## 2012-08-06 NOTE — Progress Notes (Signed)
This office note has been dictated. Account number [id number]

## 2012-08-06 NOTE — Progress Notes (Signed)
Date of Service: 08/03/2012    SUBJECTIVE:  Patient returns to the office today for treatment for chronic pain in her right foot and leg secondary to a prior contusion and subsequent reflex sympathetic dystrophy.  She was last seen by me on February 18th and she was given multiple nerve blocks utilizing Exparel liposome bupivacaine and 0.25 mL of dexamethasone.  We will repeat those injections today to the common peroneal, posterior tibial, and ankle.  Three injections were administered without any complications noted.  Each one consisted of approximately 4 mL of Exparel and 0.25 mL of dexamethasone.  She tolerated all 3 injections well.  She is clearly showing significant improvement over our prior injection protocol with bupivacaine with epinephrine and dexamethasone.  Prior injections were only lasting approximately 2 days and this injection lasted approximately 10 days.  I asked her to follow up with [place] in about 2 weeks.  We will see how she does with the medication.  Her activity level has improved greatly as well.    ___________________________  Reviewed and Electronically Signed By: Naoma Diener DPM  Sig Date: 08/21/2012  Sig Time: 16:01:46  Dictated By: Naoma Diener DPM  Dict Date: 08/06/2012 Dict Time: 10 37 AM    Dictation Date and Time:08/06/2012 10:37:25  Transcription Date and Time:08/06/2012 12:23:55  eScription Dictation id: [id number] Confirmation # :[id number]      DICTATED BY: Hallie Ertl S [facility] DPMADAM S [facility] DPMD:08/06/2012 10:37:25 T:08/06/2012 12:23:55 JN Job#: [id number]

## 2012-08-18 ENCOUNTER — Ambulatory Visit (HOSPITAL_BASED_OUTPATIENT_CLINIC_OR_DEPARTMENT_OTHER): Payer: Self-pay | Admitting: Podiatrist

## 2012-08-18 VITALS — BP 131/86 | HR 86

## 2012-08-18 DIAGNOSIS — S9030XA Contusion of unspecified foot, initial encounter: Secondary | ICD-10-CM

## 2012-08-18 DIAGNOSIS — G9050 Complex regional pain syndrome I, unspecified: Secondary | ICD-10-CM

## 2012-08-20 MED ORDER — DEXAMETHASONE SODIUM PHOSPHATE 4 MG/ML IJ SOLN
2.00 mg | Freq: Once | INTRAMUSCULAR | Status: AC
Start: 2012-08-20 — End: 2012-08-20

## 2012-08-20 NOTE — Progress Notes (Signed)
Patient returns to the office today for treatment for chronic pain in her right foot and leg secondary to a prior contusion and subsequent reflex sympathetic dystrophy. When she was last seen by me, she was given multiple nerve blocks utilizing Exparel liposome bupivacaine and 0.25 mL of dexamethasone, and this gave her complete relief.  Today, we were unable to get that medication so we will block her with bupivicaine with epinepherine and dexamiethsone (1/4cc) instead, to the common peroneal, posterior tibial, and ankle. Three injections were administered without any complications noted. Each one consisted of approximately 4 mL of Bupivicaine with epi, and 0.25 mL of dexamethasone. She tolerated all 3 injections well.  I asked her to follow up with [place] prn. We will see how she does with the medication. Her activity level has improved greatly as well.

## 2012-08-21 ENCOUNTER — Ambulatory Visit (HOSPITAL_BASED_OUTPATIENT_CLINIC_OR_DEPARTMENT_OTHER): Payer: PRIVATE HEALTH INSURANCE | Admitting: Podiatrist

## 2012-08-23 ENCOUNTER — Ambulatory Visit (HOSPITAL_BASED_OUTPATIENT_CLINIC_OR_DEPARTMENT_OTHER): Payer: Self-pay | Admitting: Podiatrist

## 2012-08-23 DIAGNOSIS — M792 Neuralgia and neuritis, unspecified: Secondary | ICD-10-CM

## 2012-08-23 DIAGNOSIS — G9050 Complex regional pain syndrome I, unspecified: Secondary | ICD-10-CM

## 2012-08-23 DIAGNOSIS — S9030XA Contusion of unspecified foot, initial encounter: Secondary | ICD-10-CM

## 2012-08-25 MED FILL — Bupivacaine Liposome Inj 1.3% (13.3 MG/ML): INTRAMUSCULAR | Qty: 20 | Status: AC

## 2012-09-03 MED ORDER — DEXAMETHASONE SODIUM PHOSPHATE 4 MG/ML IJ SOLN
2.00 mg | Freq: Once | INTRAMUSCULAR | Status: AC
Start: 2012-09-03 — End: 2012-09-03

## 2012-09-03 NOTE — Progress Notes (Signed)
Pt presents to clinic today for f/u for severe pain associated with RSD.    She has previously had excellent success with Exparel given as multiple nerve blocks utilizing Exparel liposome bupivacaine and 0.25 mL of dexamethasone. We will repeat those injections today to the common peroneal, posterior tibial, and ankle. Three injections were administered without any complications noted. Each one consisted of approximately 4 mL of Exparel and 0.25 mL of dexamethasone. She tolerated all 3 injections well. She is clearly showing significant improvement over our prior injection protocol with bupivacaine with epinephrine and dexamethasone. I asked her to follow up with [place] in about 2 weeks. We will see how she does with the medication. Her activity level continues to improve.

## 2012-09-07 NOTE — Progress Notes (Signed)
Note reviewed.  I concur with assessment.

## 2012-09-08 ENCOUNTER — Ambulatory Visit (HOSPITAL_BASED_OUTPATIENT_CLINIC_OR_DEPARTMENT_OTHER): Payer: PRIVATE HEALTH INSURANCE | Admitting: Podiatrist

## 2012-09-08 ENCOUNTER — Encounter (HOSPITAL_BASED_OUTPATIENT_CLINIC_OR_DEPARTMENT_OTHER): Payer: Self-pay | Admitting: Podiatrist

## 2012-09-08 DIAGNOSIS — M79673 Pain in unspecified foot: Secondary | ICD-10-CM

## 2012-09-08 DIAGNOSIS — G9050 Complex regional pain syndrome I, unspecified: Secondary | ICD-10-CM

## 2012-09-08 DIAGNOSIS — S9030XA Contusion of unspecified foot, initial encounter: Secondary | ICD-10-CM

## 2012-09-08 NOTE — Progress Notes (Signed)
Date of Service: 09/08/2012    SUBJECTIVE:  The patient presents to the office today for continued and persistent pain in her right foot and lower leg.    PROCEDURE:  Treatment today consisted of injections with EXPAREL to the posterior tibial, common peroneal, and an ankle block as well.  She tolerated the injection well.  There was 0.25 mL of dexamethasone and 5 mL of EXPAREL administered to each site.  She had no negative side effects.  She reports that her last injection lasted close to 2 weeks.  However, in the last 2 days, the pain returned with substantial increase in pain.  She noticed almost immediate relief after administering the injection.      PLAN:  For her to follow up with [place] in 2-3 weeks on a p.r.n. basis.    ___________________________  Reviewed and Electronically Signed By: Naoma Diener DPM  Sig Date: 09/13/2012  Sig Time: 18:44:19  Dictated By: Naoma Diener DPM  Dict Date: 09/08/2012 Dict Time: 02 46 PM    Dictation Date and Time:09/08/2012 14:46:15  Transcription Date and Time:09/08/2012 15:05:00  eScription Dictation id: [id number] Confirmation # :[id number]      DICTATED BY: Alexsander Cavins S Joie Reamer DPMADAM S Rakayla Ricklefs DPMD:09/08/2012 14:46:15 T:09/08/2012 15:05:00 KN Job#: [id number]

## 2012-09-11 MED FILL — Bupivacaine Liposome Inj 1.3% (13.3 MG/ML): INTRAMUSCULAR | Qty: 20 | Status: CN

## 2012-09-13 MED ORDER — BUPIVACAINE LIPOSOME 1.3 % IJ SUSP
1.00 mL | Freq: Once | INTRAMUSCULAR | Status: AC
Start: 2012-09-13 — End: 2012-09-13

## 2012-09-13 MED ORDER — DEXAMETHASONE SODIUM PHOSPHATE 4 MG/ML IJ SOLN
2.00 mg | Freq: Once | INTRAMUSCULAR | Status: AC
Start: 2012-09-13 — End: 2012-09-13

## 2012-09-22 ENCOUNTER — Ambulatory Visit (HOSPITAL_BASED_OUTPATIENT_CLINIC_OR_DEPARTMENT_OTHER): Payer: PRIVATE HEALTH INSURANCE | Admitting: Podiatrist

## 2012-09-22 DIAGNOSIS — S9030XA Contusion of unspecified foot, initial encounter: Secondary | ICD-10-CM

## 2012-09-22 DIAGNOSIS — M79673 Pain in unspecified foot: Secondary | ICD-10-CM

## 2012-09-22 DIAGNOSIS — G9050 Complex regional pain syndrome I, unspecified: Secondary | ICD-10-CM

## 2012-09-24 MED ORDER — BUPIVACAINE LIPOSOME 1.3 % IJ SUSP
1.00 mL | Freq: Once | INTRAMUSCULAR | Status: AC
Start: 2012-09-24 — End: 2012-09-24

## 2012-09-24 NOTE — Progress Notes (Signed)
This office note has been dictated. 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

## 2012-09-24 NOTE — Progress Notes (Signed)
Date of Service: 09/22/2012    SUBJECTIVE:  The patient presents to the office today for followup for reflex sympathetic dystrophy and contusion to her right foot and lower leg.  She has been getting excellent results with Exparel, with the last injection lasting a little over 3 weeks.    PAST MEDICAL HISTORY:  Otherwise, unchanged since her last visit.      REVIEW OF SYSTEMS:  She denies fever, chills, nausea, vomiting, night sweats, and shortness of breath.      PHYSICAL EXAMINATION:  There is no evidence of ascending cellulitis and no breaks in the skin noted.    PLAN:  For her to continue with Exparel injection.  We have given 3 nerve blocks to trigger points today at the common peroneal, posterior tibial, and ankle blocks.  She tolerated the injections well without any complications noted.  Plan is for her to follow up with [place] on a p.r.n. basis.    ___________________________  Reviewed and Electronically Signed By: Naoma Diener DPM  Sig Date: 10/02/2012  Sig Time: 16:39:14  Dictated By: Naoma Diener DPM  Dict Date: 09/24/2012 Dict Time: 01 14 PM    Dictation Date and Time:09/24/2012 13:14:01  Transcription Date and Time:09/24/2012 20:12:28  eScription Dictation id: [id number] Confirmation # :[id number]      DICTATED BY: Rubee Vega S Donovan Persley DPMADAM S Kemya Shed DPMD:09/24/2012 13:14:01 T:09/24/2012 20:12:28 NN Job#: [id number]

## 2012-10-13 ENCOUNTER — Ambulatory Visit (HOSPITAL_BASED_OUTPATIENT_CLINIC_OR_DEPARTMENT_OTHER): Payer: PRIVATE HEALTH INSURANCE | Admitting: Podiatrist

## 2012-10-13 ENCOUNTER — Encounter (HOSPITAL_BASED_OUTPATIENT_CLINIC_OR_DEPARTMENT_OTHER): Payer: Self-pay | Admitting: Podiatrist

## 2012-10-13 VITALS — BP 125/90 | HR 98 | Temp 98.0°F

## 2012-10-13 DIAGNOSIS — S9030XA Contusion of unspecified foot, initial encounter: Secondary | ICD-10-CM

## 2012-10-13 DIAGNOSIS — G9050 Complex regional pain syndrome I, unspecified: Secondary | ICD-10-CM

## 2012-10-16 MED ORDER — BUPIVACAINE LIPOSOME 1.3 % IJ SUSP
1.00 mL | Freq: Once | INTRAMUSCULAR | Status: AC
Start: 2012-10-16 — End: 2012-10-16

## 2012-10-16 MED ORDER — DEXAMETHASONE SODIUM PHOSPHATE 4 MG/ML IJ SOLN
2.00 mg | Freq: Once | INTRAMUSCULAR | Status: AC
Start: 2012-10-16 — End: 2012-10-16

## 2012-10-16 NOTE — Progress Notes (Signed)
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  Since we began using Exparel, she has now been able to spread her injections out to approximately once per month.  Today, we have performed the same 3 blocks, peroneal, posterior tibial, and ankle blocks all to the right foot and lower leg.  She indicates that there is some immediate relief after the injection.  The injection consisted of approximately 5 mL of Exparel and 0.25 mL of dexamethasone at each location.  She tolerated the injections well without any complications noted.      PLAN:  Is for her to follow up with [place] on a p.r.n. basis.  She also notes that she has been experiencing a migraine headache just before the pain sets in, after the injection has thoroughly worn off.    ___________________________  Reviewed and Electronically Signed By: Naoma Diener DPM  Sig Date: 10/17/2012  Sig Time: 19:40:53  Dictated By: Naoma Diener DPM  Dict Date: 10/16/2012 Dict Time: 03 46 PM    Dictation Date and Time:10/16/2012 15:46:58  Transcription Date and Time:10/16/2012 16:11:10  eScription Dictation id: [id number] Confirmation # :[id number]

## 2012-10-16 NOTE — Progress Notes (Signed)
This office note has been dictated. 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

## 2012-10-23 ENCOUNTER — Ambulatory Visit (HOSPITAL_BASED_OUTPATIENT_CLINIC_OR_DEPARTMENT_OTHER): Payer: PRIVATE HEALTH INSURANCE | Admitting: Podiatrist

## 2012-10-23 ENCOUNTER — Encounter (HOSPITAL_BASED_OUTPATIENT_CLINIC_OR_DEPARTMENT_OTHER): Payer: Self-pay | Admitting: Podiatrist

## 2012-10-23 VITALS — BP 141/84 | HR 115 | Temp 97.6°F

## 2012-10-23 DIAGNOSIS — S9030XA Contusion of unspecified foot, initial encounter: Secondary | ICD-10-CM

## 2012-10-23 DIAGNOSIS — G9050 Complex regional pain syndrome I, unspecified: Secondary | ICD-10-CM

## 2012-10-23 DIAGNOSIS — M84376A Stress fracture, unspecified foot, initial encounter for fracture: Secondary | ICD-10-CM

## 2012-10-23 LAB — XR FOOT RIGHT MINIMUM 3 VIEWS

## 2012-10-23 MED ORDER — DEXAMETHASONE SODIUM PHOSPHATE 4 MG/ML IJ SOLN
2.00 mg | Freq: Once | INTRAMUSCULAR | Status: AC
Start: 2012-10-23 — End: 2012-10-23

## 2012-10-23 MED ORDER — CYCLOBENZAPRINE HCL 10 MG PO TABS
10.0000 mg | ORAL_TABLET | Freq: Two times a day (BID) | ORAL | Status: AC | PRN
Start: 2012-10-23 — End: 2012-11-22

## 2012-10-23 NOTE — Addendum Note (Signed)
Addended byRosilyn Mings, Troy Hartzog on: 10/23/2012 01:50 PM     Modules accepted: Orders

## 2012-10-24 MED ORDER — NAPROXEN 375 MG PO TABS
375.00 mg | ORAL_TABLET | Freq: Two times a day (BID) | ORAL | Status: AC
Start: 2012-10-24 — End: 2016-03-31

## 2012-10-24 MED ORDER — TRAMADOL HCL 50 MG PO TABS
50.0000 mg | ORAL_TABLET | Freq: Four times a day (QID) | ORAL | Status: DC | PRN
Start: 2012-10-24 — End: 2013-05-07

## 2012-10-25 ENCOUNTER — Ambulatory Visit (HOSPITAL_BASED_OUTPATIENT_CLINIC_OR_DEPARTMENT_OTHER): Payer: Self-pay | Admitting: Podiatrist

## 2012-10-25 DIAGNOSIS — G9050 Complex regional pain syndrome I, unspecified: Secondary | ICD-10-CM

## 2012-10-25 DIAGNOSIS — S9030XA Contusion of unspecified foot, initial encounter: Secondary | ICD-10-CM

## 2012-10-25 DIAGNOSIS — M84376A Stress fracture, unspecified foot, initial encounter for fracture: Secondary | ICD-10-CM

## 2012-10-25 MED ORDER — KETOROLAC TROMETHAMINE 10 MG PO TABS
10.0000 mg | ORAL_TABLET | Freq: Four times a day (QID) | ORAL | Status: DC | PRN
Start: 2012-10-25 — End: 2014-12-13

## 2012-10-25 NOTE — Progress Notes (Deleted)
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     Comment: crush injury right foot         Past Surgical History    TOTAL ABDOMINAL HYSTERECT W/WO RMVL TUBE OVARY      Comment just TAH no ovary removal, 2005; laparascopies x 2 before TAH for endometriosis    ACL REPAIR      Comment left 2004, cadavaric acl    OB ANTEPARTUM CARE CESAREAN DLVR & POSTPARTUM      Comment x2, 97 and 98    ROTATOR CUFF REPAIR      Comment Labral repair anchor not sure if plastic or metal but had MRI with this in place without problems, on right shoulder       Family History    Heart Disease Father     Comment: quadruple bypass 50     Heart Mother     Comment: atrial fibrillation    GI Mother     Comment: bowel resection not sure why    Endocrine Brother     Comment: hypothyroidism    Psychiatric Illness Son     Comment: adhd, pdd    No Known Family History Daughter     Heart Disease Maternal Grandfather     Comment: clogged arterities    Pulmonary Paternal Grandfather     Comment: emphysema     ALLERGIES: Review of Patient's Allergies indicates:   Oxycodone                   Comment:Hives, other narcotics OK   Penicillins             Hives   Erythromycin            Nausea Only    Comment:Stomach pain severe no nausea, can take             azithromycin   Fluoxetine              Rash  MEDICATIONS:   Current Outpatient Prescriptions on File Prior to Visit:  naproxen (NAPROSYN) 375 MG tablet Take 1 tablet by mouth 2 (two) times daily with meals. Disp: 28 tablet Rfl: 1   cyclobenzaprine (FLEXERIL) 10 MG tablet Take 1 tablet by mouth 2 (two) times daily as needed. Disp: 60 tablet Rfl: 1    Diclofenac Sodium 1 % GEL Gel Apply  topically. Do Not Exceed 32 g in 24 Hours Disp:  Rfl:    Tapentadol HCl (NUCYNTA) 150 MG TB12 Take 1 tablet by mouth 4 (four) times daily. Disp: 84 tablet Rfl: 0   hydrOXYzine (ATARAX) 25 MG tablet Take 1 tablet by mouth every 8 (eight) hours as needed for Itching or Anxiety. Disp: 30 tablet Rfl: 3   Multiple Vitamin (MULTIVITAMIN) tablet Take 1 tablet by mouth daily. Disp: 30 tablet Rfl: 11   hydrochlorothiazide (HYDRODIURIL) 25 MG tablet Take 1 tablet by mouth daily. Disp: 90 tablet Rfl: 4   amlodipine (NORVASC) 5 MG tablet  Take 1 tablet by mouth daily. Disp: 30 tablet Rfl: 11   SYNTHROID 125 MCG tablet Take 1 tablet by mouth every morning. Disp: 90 tablet Rfl: 3   fluticasone-salmeterol (ADVAIR DISKUS) 500-50 MCG/DOSE AEPB Aerosol Powder Inhale 1 puff into the lungs every 12 (twelve) hours. Disp: 180 each Rfl: 0   lidocaine (LIDODERM) 5 % patch Place 1 patch onto the skin daily. Disp: 30 patch Rfl: 4   traMADol (ULTRAM) 50 MG tablet Take 1 tablet by mouth every 6 (six) hours as needed for Pain. Disp: 40 tablet Rfl: 0   albuterol (PROAIR HFA) 108 (90 BASE) MCG/ACT inhaler Inhale 2 puffs into the lungs every 6 (six) hours as needed for Wheezing. Disp: 1 Inhaler Rfl: 11   EPINEPHrine (EPIPEN 2-PAK) 0.3 MG/0.3ML Injection Device Inject 0.3 mg into the muscle once as needed. Disp: 1 each Rfl: 1   clindamycin-benzoyl peroxide (BENZACLIN) gel Apply  topically 2 (two) times daily. Disp: 50 g Rfl: 4     Current Facility-Administered Medications on File Prior to Visit:  bupivacaine liposomal (EXPAREL) injection 1-20 mL 1-20 mL Infiltration Once [facility]   bupivacaine liposomal (EXPAREL) injection 1-20 mL 1-20 mL Infiltration Once [facility]        Review of Systems   Constitutional: Negative for fever, chills, weight loss, malaise/fatigue and diaphoresis.   HENT: Negative for hearing loss, ear pain, nosebleeds, congestion, tinnitus and ear discharge.    Eyes: Negative for  blurred vision, double vision, photophobia, pain, discharge and redness.   Respiratory: Negative for cough and hemoptysis.    Cardiovascular: Negative for chest pain and palpitations.   Gastrointestinal: Negative for heartburn, nausea, vomiting, abdominal pain, diarrhea, constipation, blood in stool and melena.   Neurological: Negative for dizziness, tingling, tremors, sensory change, speech change, focal weakness, weakness and headaches.   Psychiatric/Behavioral: Negative for depression, suicidal ideas, hallucinations and substance abuse. The patient is not nervous/anxious.         PHYSICAL EXAMINATION:   GENERAL:   DISTAL EXTREMITY EVALUATION:    Vascular: DP and PT pulses are 2/4 to bilateral LE. LE is warm to warm from proximal to distal. CFT less than 3sec to distal pulps of digits 1-5 bilateral LE. No edema noted to bilateral LE   Neuro: Epicritic and protective sensation grossly intact to bilateral LE. Proprioception grossly intact to bilateral LE.   Derm:  There are no open lesions noted to bilateral LE. The skin is supple to bilateral LE  MSK: Muscle strength is 5/5 in all compartments of bilateral LE. There is no pain on passive ROM of pedal and ankle joints bilaterally.   ASSESSMENT:   PLAN:   -Patient seen and evaluated  -Discussed with patient   -Pt RTC

## 2012-10-25 NOTE — Progress Notes (Signed)
SUBJECTIVE: This is a 41yof who is well known to the practice for treatment of right LE RSD. Patient was last seen 10/23/12 for an acute injury to the right foot she sustained on 10/22/12. Patient states that though she usually experiences relief after Exparel infusions she did not experience any relief after the 3 that she received on Monday (10/23/12).  Patient states that the pain on the top of her right foot is still at a level of 8/10 and that she still experiences edema to the area. Patient states she has been using the Nucynta, naprosyn, voltaren gel, and Flexeril as instructed as well as the short CAM boot; however, she finds no relief of symptoms. Pt denies any other pedal complaints at this time.       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     Comment: crush injury right foot         Past Surgical History    TOTAL ABDOMINAL HYSTERECT W/WO RMVL TUBE OVARY      Comment just TAH no ovary removal, 2005; laparascopies x 2 before TAH for endometriosis    ACL REPAIR      Comment left 2004, cadavaric acl    OB ANTEPARTUM CARE CESAREAN DLVR & POSTPARTUM      Comment x2, 97 and 98    ROTATOR CUFF REPAIR      Comment Labral repair anchor not sure if plastic or metal but had MRI with this in place without problems, on right shoulder       Family History    Heart Disease Father     Comment: quadruple bypass 50     Heart Mother     Comment: atrial fibrillation    GI Mother     Comment: bowel resection not sure why    Endocrine Brother     Comment: hypothyroidism    Psychiatric Illness Son     Comment: adhd, pdd    No Known Family History Daughter     Heart Disease Maternal  Grandfather     Comment: clogged arterities    Pulmonary Paternal Grandfather     Comment: emphysema     ALLERGIES: Review of Patient's Allergies indicates:   Oxycodone                   Comment:Hives, other narcotics OK   Penicillins             Hives   Erythromycin            Nausea Only    Comment:Stomach pain severe no nausea, can take             azithromycin   Fluoxetine              Rash  MEDICATIONS:   Current Outpatient Prescriptions on File Prior to Visit:  naproxen (NAPROSYN) 375 MG tablet Take 1 tablet by mouth 2 (two) times daily with meals. Disp: 28 tablet Rfl: 1   cyclobenzaprine (FLEXERIL) 10 MG tablet Take  1 tablet by mouth 2 (two) times daily as needed. Disp: 60 tablet Rfl: 1   Diclofenac Sodium 1 % GEL Gel Apply  topically. Do Not Exceed 32 g in 24 Hours Disp:  Rfl:    Tapentadol HCl (NUCYNTA) 150 MG TB12 Take 1 tablet by mouth 4 (four) times daily. Disp: 84 tablet Rfl: 0   hydrOXYzine (ATARAX) 25 MG tablet Take 1 tablet by mouth every 8 (eight) hours as needed for Itching or Anxiety. Disp: 30 tablet Rfl: 3   Multiple Vitamin (MULTIVITAMIN) tablet Take 1 tablet by mouth daily. Disp: 30 tablet Rfl: 11   hydrochlorothiazide (HYDRODIURIL) 25 MG tablet Take 1 tablet by mouth daily. Disp: 90 tablet Rfl: 4   amlodipine (NORVASC) 5 MG tablet Take 1 tablet by mouth daily. Disp: 30 tablet Rfl: 11   SYNTHROID 125 MCG tablet Take 1 tablet by mouth every morning. Disp: 90 tablet Rfl: 3   fluticasone-salmeterol (ADVAIR DISKUS) 500-50 MCG/DOSE AEPB Aerosol Powder Inhale 1 puff into the lungs every 12 (twelve) hours. Disp: 180 each Rfl: 0   lidocaine (LIDODERM) 5 % patch Place 1 patch onto the skin daily. Disp: 30 patch Rfl: 4   traMADol (ULTRAM) 50 MG tablet Take 1 tablet by mouth every 6 (six) hours as needed for Pain. Disp: 40 tablet Rfl: 0   albuterol (PROAIR HFA) 108 (90 BASE) MCG/ACT inhaler Inhale 2 puffs into the lungs every 6 (six) hours as needed for Wheezing. Disp: 1 Inhaler Rfl: 11   EPINEPHrine  (EPIPEN 2-PAK) 0.3 MG/0.3ML Injection Device Inject 0.3 mg into the muscle once as needed. Disp: 1 each Rfl: 1   clindamycin-benzoyl peroxide (BENZACLIN) gel Apply  topically 2 (two) times daily. Disp: 50 g Rfl: 4     Current Facility-Administered Medications on File Prior to Visit:  bupivacaine liposomal (EXPAREL) injection 1-20 mL 1-20 mL Infiltration Once [facility]   bupivacaine liposomal (EXPAREL) injection 1-20 mL 1-20 mL Infiltration Once [facility]        Review of Systems   Constitutional: Negative for fever, chills, weight loss, malaise/fatigue and diaphoresis.   HENT: Negative for hearing loss, ear pain, nosebleeds, congestion, tinnitus and ear discharge.    Eyes: Negative for blurred vision, double vision, photophobia, pain, discharge and redness.   Respiratory: Negative for cough and hemoptysis.    Cardiovascular: Negative for chest pain and palpitations.   Gastrointestinal: Negative for heartburn, nausea, vomiting, abdominal pain, diarrhea, constipation, blood in stool and melena.   Neurological: Negative for dizziness, tingling, tremors, sensory change, speech change, focal weakness, weakness and headaches.   Psychiatric/Behavioral: Negative for depression, suicidal ideas, hallucinations and substance abuse. The patient is not nervous/anxious.         PHYSICAL EXAMINATION:   GENERAL: 41yof ambulates into clinic with an antalgic gait with a short CAM on her right LE. Pt in NAD and AAOx3  DISTAL EXTREMITY EVALUATION:    Vascular: DP and PT pulses are 2/4 to bilateral LE. LE is warm to warm from proximal to distal. CFT less than 3sec to distal pulps of digits 1-5 bilateral LE. Non-pitting edema noted to  Forefoot of right LE. There is ecchymosis noted to the dorsum of the right midfoot as well as the anterior right ankle, and proximal aspect of the lateral right leg.   Neuro: Epicritic and protective sensation grossly diminished to bilateral LE. Proprioception grossly intact to bilateral LE.    Derm:  There are no open lesions noted to bilateral LE. The skin is  supple to bilateral LE.  MSK: Muscle and ROM testing is deferred due to pain to right LE. There is pain on palpation of the right midfoot. There is pain on placement of a tuning fork over the area of the 2nd metatarsal, right foot (positive chandelier's sign)   ASSESSMENT:   1. Midfoot injury, right foot  PLAN:   -Patient seen and evaluated  -Discussed with patient imaging results from her last visit are not impressive at this point.  -Discussed use of anti-inflammatories including a medrol dose pack which the patient stated she does not want to use. Decided upon use of Toradol  -Rx Toradol  - Below knee posterior splint applied in office to right LE without incident  -Crutches dispensed to patient  -Patient instructed to keep right LE NWB until her next appointment    -Pt RTC 1 week.

## 2012-11-03 ENCOUNTER — Encounter (HOSPITAL_BASED_OUTPATIENT_CLINIC_OR_DEPARTMENT_OTHER): Payer: Self-pay | Admitting: Podiatrist

## 2012-11-03 ENCOUNTER — Ambulatory Visit (HOSPITAL_BASED_OUTPATIENT_CLINIC_OR_DEPARTMENT_OTHER): Payer: PRIVATE HEALTH INSURANCE | Admitting: Podiatrist

## 2012-11-03 DIAGNOSIS — M84376A Stress fracture, unspecified foot, initial encounter for fracture: Secondary | ICD-10-CM

## 2012-11-03 DIAGNOSIS — G9050 Complex regional pain syndrome I, unspecified: Secondary | ICD-10-CM

## 2012-11-03 DIAGNOSIS — S9030XA Contusion of unspecified foot, initial encounter: Secondary | ICD-10-CM

## 2012-11-06 MED ORDER — DEXAMETHASONE SODIUM PHOSPHATE 4 MG/ML IJ SOLN
2.00 mg | Freq: Once | INTRAMUSCULAR | Status: AC
Start: 2012-11-06 — End: 2012-11-06

## 2012-11-06 MED ORDER — BUPIVACAINE LIPOSOME 1.3 % IJ SUSP
1.0000 mL | Freq: Once | INTRAMUSCULAR | Status: DC
Start: 2012-11-06 — End: 2014-01-16

## 2012-11-06 NOTE — Progress Notes (Signed)
Date of Service: 10/23/2012    SUBJECTIVE:  The patient presents on an urgent on an urgent basis following a contusion to her right foot.  She also is having pain associated with her reflex sympathetic dystrophy.    OBJECTIVE:  Today, we have ordered radiographs including AP, lateral and oblique views, which were negative for any obvious signs of infection.  However, there is some suspicion of a small mid shaft injury to her second metatarsal, which does coincide quite well with her focused area of pain.  There are no breaks in the skin.  She does have ecchymosis noted, and there is obvious edema in the foot.  Today's treatment consisted of the following:  1.  Injection of Exparel to her common peroneal, posterior tibial and an ankle block administered in the standard fashion; each site requiring approximately 5 mL of Exparel and 0.25 mL of dexamethasone 4 mg/mL.      ASSESSMENT AND PLAN:  With regard to her possible stress fracture, we have fabricated and dispensed a short ankle splint, and she does indicate that this is comfortable.  She will also utilize crutches for ambulation.  She can transition to a fixed ankle boot if she is able to tolerate it.  However, I would like her to stay with the splint for at least a week in order to reduce the edema as much as possible.  She is to follow up with [place] in approximately 1 week if symptoms have not improved.    ___________________________  Reviewed and Electronically Signed By: Naoma Diener DPM  Sig Date: 11/06/2012  Sig Time: 09:37:37  Dictated By: Naoma Diener DPM  Dict Date: 11/05/2012 Dict Time: 10 29 PM    Dictation Date and Time:11/05/2012 22:29:39  Transcription Date and Time:11/06/2012 06:45:05  eScription Dictation id: [id number] Confirmation # :[id number]

## 2012-11-06 NOTE — Progress Notes (Signed)
SUBJECTIVE: This is a 41yof who is well known to the practice for treatment of right LE RSD. Patient was last seen 10/23/12 for an acute injury to the right foot she sustained on 10/22/12. Patient states that though she usually experiences relief after Exparel infusions she did not experience any relief after the 3 that she received on Monday (10/23/12).  Patient states that the pain on the top of her right foot is still at a level of 8/10 and that she still experiences edema to the area. Patient states she has been using the Nucynta, naprosyn, voltaren gel, and Flexeril as instructed as well as the short CAM boot; however, she finds no relief of symptoms. Pt denies any other pedal complaints at this time.       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     Comment: crush injury right foot         Past Surgical History    TOTAL ABDOMINAL HYSTERECT W/WO RMVL TUBE OVARY      Comment just TAH no ovary removal, 2005; laparascopies x 2 before TAH for endometriosis    ACL REPAIR      Comment left 2004, cadavaric acl    OB ANTEPARTUM CARE CESAREAN DLVR & POSTPARTUM      Comment x2, 97 and 98    ROTATOR CUFF REPAIR      Comment Labral repair anchor not sure if plastic or metal but had MRI with this in place without problems, on right shoulder       Family History    Heart Disease Father     Comment: quadruple bypass 50     Heart Mother     Comment: atrial fibrillation    GI Mother     Comment: bowel resection not sure why    Endocrine Brother     Comment: hypothyroidism    Psychiatric Illness Son     Comment: adhd, pdd    No Known Family History Daughter     Heart Disease Maternal  Grandfather     Comment: clogged arterities    Pulmonary Paternal Grandfather     Comment: emphysema     ALLERGIES: Review of Patient's Allergies indicates:   Oxycodone                   Comment:Hives, other narcotics OK   Penicillins             Hives   Erythromycin            Nausea Only    Comment:Stomach pain severe no nausea, can take             azithromycin   Fluoxetine              Rash  MEDICATIONS:     Current Outpatient Prescriptions on File Prior to Visit:  naproxen (NAPROSYN) 375 MG tablet Take 1 tablet by mouth 2 (two) times daily with meals. Disp: 28 tablet Rfl: 1   cyclobenzaprine (FLEXERIL) 10 MG  tablet Take 1 tablet by mouth 2 (two) times daily as needed. Disp: 60 tablet Rfl: 1   Diclofenac Sodium 1 % GEL Gel Apply  topically. Do Not Exceed 32 g in 24 Hours Disp:  Rfl:    Tapentadol HCl (NUCYNTA) 150 MG TB12 Take 1 tablet by mouth 4 (four) times daily. Disp: 84 tablet Rfl: 0   hydrOXYzine (ATARAX) 25 MG tablet Take 1 tablet by mouth every 8 (eight) hours as needed for Itching or Anxiety. Disp: 30 tablet Rfl: 3   Multiple Vitamin (MULTIVITAMIN) tablet Take 1 tablet by mouth daily. Disp: 30 tablet Rfl: 11   hydrochlorothiazide (HYDRODIURIL) 25 MG tablet Take 1 tablet by mouth daily. Disp: 90 tablet Rfl: 4   amlodipine (NORVASC) 5 MG tablet Take 1 tablet by mouth daily. Disp: 30 tablet Rfl: 11   SYNTHROID 125 MCG tablet Take 1 tablet by mouth every morning. Disp: 90 tablet Rfl: 3   fluticasone-salmeterol (ADVAIR DISKUS) 500-50 MCG/DOSE AEPB Aerosol Powder Inhale 1 puff into the lungs every 12 (twelve) hours. Disp: 180 each Rfl: 0   lidocaine (LIDODERM) 5 % patch Place 1 patch onto the skin daily. Disp: 30 patch Rfl: 4   dexamethasone (DECADRON) 4 MG/ML injection Inject 0.5 mLs into the muscle once. Disp: 0.5 mL Rfl: 0   traMADol (ULTRAM) 50 MG tablet Take 1 tablet by mouth every 6 (six) hours as needed for Pain. Disp: 40 tablet Rfl: 0   albuterol (PROAIR HFA) 108 (90 BASE) MCG/ACT inhaler Inhale 2  puffs into the lungs every 6 (six) hours as needed for Wheezing. Disp: 1 Inhaler Rfl: 11   EPINEPHrine (EPIPEN 2-PAK) 0.3 MG/0.3ML Injection Device Inject 0.3 mg into the muscle once as needed. Disp: 1 each Rfl: 1   clindamycin-benzoyl peroxide (BENZACLIN) gel Apply  topically 2 (two) times daily. Disp: 50 g Rfl: 4     Current Facility-Administered Medications on File Prior to Visit:  bupivacaine liposomal (EXPAREL) injection 1-20 mL 1-20 mL Infiltration Once Alter Moss   bupivacaine liposomal (EXPAREL) injection 1-20 mL 1-20 mL Infiltration Once Julianna Vanwagner   bupivacaine liposomal (EXPAREL) injection 1-20 mL 1-20 mL Infiltration Once [facility]        Review of Systems   Constitutional: Negative for fever, chills, weight loss, malaise/fatigue and diaphoresis.   HENT: Negative for hearing loss, ear pain, nosebleeds, congestion, tinnitus and ear discharge.    Eyes: Negative for blurred vision, double vision, photophobia, pain, discharge and redness.   Respiratory: Negative for cough and hemoptysis.    Cardiovascular: Negative for chest pain and palpitations.   Gastrointestinal: Negative for heartburn, nausea, vomiting, abdominal pain, diarrhea, constipation, blood in stool and melena.   Neurological: Negative for dizziness, tingling, tremors, sensory change, speech change, focal weakness, weakness and headaches.   Psychiatric/Behavioral: Negative for depression, suicidal ideas, hallucinations and substance abuse. The patient is not nervous/anxious.         PHYSICAL EXAMINATION:   GENERAL: 41yof ambulates into clinic with an antalgic gait with a short CAM on her right LE. Pt in NAD and AAOx3  DISTAL EXTREMITY EVALUATION:    Vascular: DP and PT pulses are 2/4 to bilateral LE. LE is warm to warm from proximal to distal. CFT less than 3sec to distal pulps of digits 1-5 bilateral LE. Non-pitting edema noted to  Forefoot of right LE. There is ecchymosis noted to the dorsum of the right midfoot as well as the  anterior right ankle, and proximal aspect of the lateral right  leg.   Neuro: Epicritic and protective sensation grossly diminished to bilateral LE. Proprioception grossly intact to bilateral LE.   Derm:  There are no open lesions noted to bilateral LE. The skin is supple to bilateral LE.  MSK: Muscle and ROM testing is deferred due to pain to right LE. There is pain on palpation of the right midfoot. There is pain on placement of a tuning fork over the area of the 2nd metatarsal, right foot (positive chandelier's sign)   ASSESSMENT:   1. Midfoot injury, right foot  2. RSD, right foot  PLAN:   -Patient seen and evaluated  -Discussed with patient imaging results from her last visit are not impressive at this point.  However there is some suspicion of a stress fracture of the midshaft, 2nd metatarsal, right foot.  -Discussed use of anti-inflammatories including a medrol dose pack which the patient stated she does not want to use. Decided upon use of Toradol  -Rx Toradol  - Below knee fiberglass posterior splint applied in office to right LE without incident  -Crutches dispensed to patient  -Patient instructed to keep right LE NWB until her next appointment    -Pt RTC 1 week.

## 2012-11-06 NOTE — Progress Notes (Signed)
This office note has been dictated. 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

## 2012-11-09 NOTE — Progress Notes (Signed)
Date of Service: 11/03/2012    SUBJECTIVE:  The patient presents to the office today with a chief concern of ongoing pain in her right foot.    PAST MEDICAL HISTORY:  Unchanged since her last visit with the exception of severe pain in her right foot following a contusion.  Pain appears to be primarily localized to the second metatarsal region.    OBJECTIVE:  Based on  clinical examination today and rereview of the x-ray, I believe that it is possible that she may have a stress fracture of her second metatarsal at the level of the midshaft.  This does coincide well with focal areas of tenderness.  I have recommended to her that she consider a full immobilization to help curtail the pain, and I have dispensed crutches to her today.  I have also held off on giving her the EXPAREL injection today, as her pain today does not appear to be associated with the reflex sympathetic dystrophy.  Foot is normal in temperature and shows no mottling of the skin.  At previous visits, she has had both of those attributes notable.  Followup with her as indicated.    ___________________________  Reviewed and Electronically Signed By: Naoma Diener DPM  Sig Date: 11/10/2012  Sig Time: 10:42:41  Dictated By: Naoma Diener DPM  Dict Date: 11/08/2012 Dict Time: 07 10 PM    Dictation Date and Time:11/08/2012 19:10:25  Transcription Date and Time:11/09/2012 11:19:45  eScription Dictation id: [id number] Confirmation # :[id number]

## 2012-11-10 NOTE — Progress Notes (Signed)
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  Sensorium is grossly intact.  Pt has 5/5 muscle strength bilaterally, for lower extremity based on manual muscle testing of DF/PF, Inversion/Eversion, External/Internal rotation, and contraction/extension of feet.  Pt is (-) for calf pain with direct compression.  Pt denies f/c/n/v/sob. Pt is awake, alert and oriented, and is able to communicate their concerns and appears to understand my responses.    PMH is discussed, and is unchanged since their last doctor's appointment.    Severe pain is noted in the right forefoot, particularly over the shaft of the 2nd metatarsal.

## 2012-11-13 ENCOUNTER — Encounter (HOSPITAL_BASED_OUTPATIENT_CLINIC_OR_DEPARTMENT_OTHER): Payer: Self-pay | Admitting: Podiatrist

## 2012-11-13 ENCOUNTER — Ambulatory Visit (HOSPITAL_BASED_OUTPATIENT_CLINIC_OR_DEPARTMENT_OTHER): Payer: PRIVATE HEALTH INSURANCE | Admitting: Podiatrist

## 2012-11-13 DIAGNOSIS — M79673 Pain in unspecified foot: Secondary | ICD-10-CM

## 2012-11-13 DIAGNOSIS — S9030XA Contusion of unspecified foot, initial encounter: Secondary | ICD-10-CM

## 2012-11-13 DIAGNOSIS — G9050 Complex regional pain syndrome I, unspecified: Secondary | ICD-10-CM

## 2012-11-13 LAB — XR FOOT RIGHT MINIMUM 3 VIEWS

## 2012-11-13 NOTE — Progress Notes (Cosign Needed)
S: 42 year old female patient RTO for continued treatment of CRPS of the right foot. Patient suffered a traumatic injury to her right foot 2.5 years ago and ever since has been suffering from CRPS.Patient has been receiving liposomal bupivicane injections in her right foot, ankle and leg. Patient denies any changes to her health since her last visit.     ROS:  Patient is well developed, well nourished 42 year old female patient that denies F/C/N/V.    O:   Vasc:   DP and PT pulses palpable B/L with a cap refill time of less than 3 sec.   Derm: No open skin lesions or wounds  Neuro: CRPS of right foot. Gross epicritic sensation in tact.   Musc: 5/5 strength all 4 quadrants bilaterally. No bony deformities noted.     A: CRPS secondary to trauma of the right foot    P: Patient given 3, 5 cc injections of Exparil . Patient can transition out of the CAM boot at this time and should wear supportive shoes. Patient will return in 3-4 weeks for another injection.

## 2012-11-26 MED ORDER — DEXAMETHASONE SODIUM PHOSPHATE 4 MG/ML IJ SOLN
2.00 mg | Freq: Once | INTRAMUSCULAR | Status: AC
Start: 2012-11-26 — End: 2012-11-26

## 2012-11-26 MED ORDER — BUPIVACAINE LIPOSOME 1.3 % IJ SUSP
1.0000 mL | Freq: Once | INTRAMUSCULAR | Status: DC
Start: 2012-11-26 — End: 2014-01-16

## 2012-11-26 NOTE — Progress Notes (Signed)
S: 42 year old female patient RTO for continued treatment of CRPS of the right foot. Patient suffered a traumatic injury to her right foot 2.5 years ago and ever since has been suffering from CRPS.Patient has been receiving liposomal bupivicane injections in her right foot, ankle and leg. Patient denies any changes to her health since her last visit.     ROS:  Patient is well developed, well nourished 42 year old female patient that denies F/C/N/V.    O:   Vasc:   DP and PT pulses palpable B/L with a cap refill time of less than 3 sec.   Derm: No open skin lesions or wounds  Neuro: CRPS of right foot. Gross epicritic sensation in tact.   Musc: 5/5 strength all 4 quadrants bilaterally. No bony deformities noted.     A: CRPS secondary to trauma of the right foot.   With regard to her recent foot contusion, this appears to be improving with no evidence of fracture on xray.    P: Patient given 3, 5 cc injections of Exparil with 1/4 cc dexamethasone to common peroneal, posterior tibial, and a full ankle block . Patient can transition out of the CAM boot at this time and should wear supportive shoes. Patient will return in 3-4 weeks for another injection.

## 2012-12-01 ENCOUNTER — Ambulatory Visit (HOSPITAL_BASED_OUTPATIENT_CLINIC_OR_DEPARTMENT_OTHER): Payer: PRIVATE HEALTH INSURANCE | Admitting: Podiatrist

## 2012-12-01 VITALS — BP 128/91 | HR 93

## 2012-12-01 DIAGNOSIS — G9050 Complex regional pain syndrome I, unspecified: Secondary | ICD-10-CM

## 2012-12-01 MED ORDER — ZOLPIDEM TARTRATE 5 MG PO TABS
5.0000 mg | ORAL_TABLET | Freq: Every evening | ORAL | Status: AC | PRN
Start: 2012-12-01 — End: 2012-12-31

## 2012-12-01 MED ORDER — OXYCODONE-ACETAMINOPHEN 5-325 MG PO TABS
1.0000 | ORAL_TABLET | Freq: Three times a day (TID) | ORAL | Status: DC | PRN
Start: 2012-12-01 — End: 2013-01-15

## 2012-12-01 MED ORDER — DIAZEPAM 5 MG PO TABS
5.0000 mg | ORAL_TABLET | Freq: Every evening | ORAL | Status: DC | PRN
Start: 2012-12-01 — End: 2013-02-14

## 2012-12-04 MED ORDER — BUPIVACAINE LIPOSOME 1.3 % IJ SUSP
1.0000 mL | Freq: Once | INTRAMUSCULAR | Status: DC
Start: 2012-12-04 — End: 2013-10-01

## 2012-12-04 MED ORDER — DEXAMETHASONE SODIUM PHOSPHATE 4 MG/ML IJ SOLN
2.00 mg | Freq: Once | INTRAMUSCULAR | Status: AC
Start: 2012-12-04 — End: 2012-12-04

## 2012-12-04 NOTE — Progress Notes (Signed)
Date of Service: 12/01/2012    The patient presents to the office today for followup for RSD in her right foot and lower leg, as well as contusion to her right mid foot.  The area around the contusion appears to be improving significantly since her last visit.  Her reflex sympathetic dystrophy has continued to give her difficulty; 3 nerve blocks were performed, 1 at the common peroneal, 1 at the ankle, and 1 at the posterior tibial nerve, utilizing Exparel approximately 5 mL, as well as 0.25 mL of dexamethasone to each site.  She tolerated the injections well without any complications noted.  Previously, she has had as much as 2 weeks' relief from this, so we will continue with this current treatment regimen.    ___________________________  Reviewed and Electronically Signed By: Naoma Diener DPM  Sig Date: 12/13/2012  Sig Time: 07:53:31  Dictated By: Naoma Diener DPM  Dict Date: 12/04/2012 Dict Time: 01 08 AM    Dictation Date and Time:12/04/2012 01:08:24  Transcription Date and Time:12/04/2012 11:02:54  eScription Dictation id: [id number] Confirmation # :[id number]

## 2012-12-04 NOTE — Progress Notes (Signed)
This office note has been dictated. Account number [id number]

## 2012-12-25 ENCOUNTER — Ambulatory Visit (HOSPITAL_BASED_OUTPATIENT_CLINIC_OR_DEPARTMENT_OTHER): Payer: PRIVATE HEALTH INSURANCE | Admitting: Podiatrist

## 2012-12-25 ENCOUNTER — Encounter (HOSPITAL_BASED_OUTPATIENT_CLINIC_OR_DEPARTMENT_OTHER): Payer: Self-pay | Admitting: Podiatrist

## 2012-12-25 DIAGNOSIS — G9050 Complex regional pain syndrome I, unspecified: Secondary | ICD-10-CM

## 2012-12-25 DIAGNOSIS — L600 Ingrowing nail: Secondary | ICD-10-CM

## 2012-12-25 DIAGNOSIS — L03039 Cellulitis of unspecified toe: Secondary | ICD-10-CM

## 2012-12-25 DIAGNOSIS — M79673 Pain in unspecified foot: Secondary | ICD-10-CM

## 2013-01-01 MED ORDER — DEXAMETHASONE SODIUM PHOSPHATE 4 MG/ML IJ SOLN
2.00 mg | Freq: Once | INTRAMUSCULAR | Status: AC
Start: 2013-01-01 — End: 2013-01-01

## 2013-01-01 MED ORDER — BUPIVACAINE LIPOSOME 1.3 % IJ SUSP
1.0000 mL | Freq: Once | INTRAMUSCULAR | Status: DC
Start: 2013-01-01 — End: 2013-10-01

## 2013-01-01 NOTE — Progress Notes (Signed)
Pt presents to the office today for continued foot pain associated with RSD secondary to her foot contusion.  She had several weeks of relief from series of 3 nerve blocks with Exparel.  We will repeat the injections today.  5 cc of Exparel with 1/4 cc of dexamethasone (4mg /ml) was administered to the common peroneal, posterior tibial and circumfrentially around the ankle, all on the right leg.  She reports nearly immediate relief.  I have also performed a simple slant-back procedure to remove the medial aspect of the right hallux.  Local paronychia without purulence is noted, as well as focal tenderness.  She may need a permanent correction with partial matrixectomy at next visit.

## 2013-01-08 NOTE — Progress Notes (Signed)
Date of Service: 12/25/2012    SUBJECTIVE:  The patient presents to the office today for followup for reflex sympathetic dystrophy in her right foot and leg.  She is getting excellent results with blocks at the ankle posterior tibial and common peroneal nerves using Exparel.      PLAN:  Today, plan is to repeat those injections.  She appears to be getting 2-3 weeks out of a single injection as compared to less than 1 week when using standard bupivacaine.    Injection consisted of Exparel 5 mL to the ankle posterior tibial nerve, and common peroneal nerve with 0.25 mL of dexamethasone.  She tolerated the injection well without any complications noted.  Plan is for her to follow up with [place] on a p.r.n. basis.    ___________________________  Reviewed and Electronically Signed By: Naoma Diener DPM  Sig Date: 01/08/2013  Sig Time: 14:56:08  Dictated By: Naoma Diener DPM  Dict Date: 01/08/2013 Dict Time: 08 37 AM    Dictation Date and Time:01/08/2013 08:37:48  Transcription Date and Time:01/08/2013 10:08:55  eScription Dictation id: [id number] Confirmation # :[id number]

## 2013-01-11 ENCOUNTER — Other Ambulatory Visit (HOSPITAL_BASED_OUTPATIENT_CLINIC_OR_DEPARTMENT_OTHER): Payer: Self-pay | Admitting: ESP Geriatrics

## 2013-01-11 NOTE — Progress Notes (Signed)
WBC 9.7; hematocrit 46; platelets 236,000; creatinine 0.7; GFR greater than 60; normal LFTs; albumin 4.5; TSH 0.46   ASSESSMENT: A 42 year old woman with a history of Raynaud's and blue foot when her pain is severe from reflex sympathetic dystrophy that has shown improvement, not only with nerve blocks, but with a combination of a calcium channel blocker and an alpha blocker. It is unlikely that the doxazosin is causing the edema. It may be related to the humidity over the last couple of weeks. I recommended keeping an eye on it and seeing how she does. As for pain management, I recommended her speaking with her podiatrist about the possibility of a tricyclic antidepressant, and if not that, I am wondering if an SNRI such as Cymbalta, Effexor, or Savella may be beneficial.   The patient will stay on her blood pressure medications, and I will see her back in 6 months, earlier if needed. If she has any new or concerning symptoms, she will let me know prior to that time.   ___________________________   Reviewed and Electronically Signed By: Charlane Ferretti MD   Sig Date: 01/12/2012   Sig Time: 14:34:58   Dictated By: Charlane Ferretti MD   Dict Date: 12/30/2011 Dict Time: 11 28 AM

## 2013-01-12 MED ORDER — AMLODIPINE BESYLATE 5 MG PO TABS
5.00 mg | ORAL_TABLET | Freq: Every day | ORAL | Status: AC
Start: 2013-01-11 — End: 2014-01-11

## 2013-01-15 ENCOUNTER — Encounter (HOSPITAL_BASED_OUTPATIENT_CLINIC_OR_DEPARTMENT_OTHER): Payer: Self-pay | Admitting: Podiatrist

## 2013-01-15 ENCOUNTER — Ambulatory Visit (HOSPITAL_BASED_OUTPATIENT_CLINIC_OR_DEPARTMENT_OTHER): Payer: PRIVATE HEALTH INSURANCE | Admitting: Podiatrist

## 2013-01-15 VITALS — BP 132/88 | HR 83

## 2013-01-15 DIAGNOSIS — G9050 Complex regional pain syndrome I, unspecified: Secondary | ICD-10-CM

## 2013-01-15 MED ORDER — ZOLPIDEM TARTRATE 10 MG PO TABS
10.0000 mg | ORAL_TABLET | Freq: Every evening | ORAL | Status: DC | PRN
Start: 2013-01-15 — End: 2013-02-14

## 2013-01-15 MED ORDER — DEXAMETHASONE SODIUM PHOSPHATE 4 MG/ML IJ SOLN
2.00 mg | Freq: Once | INTRAMUSCULAR | Status: AC
Start: 2013-01-15 — End: 2013-01-15

## 2013-01-15 MED ORDER — OXYCODONE-ACETAMINOPHEN 5-325 MG PO TABS
1.0000 | ORAL_TABLET | Freq: Three times a day (TID) | ORAL | Status: AC | PRN
Start: 2013-01-15 — End: 2013-01-29

## 2013-01-15 NOTE — Progress Notes (Cosign Needed)
Subjective    HPI: 42 year old female presents to the clinic with continued right foot pain secondary to RSD. The patient reports that the pain has been there for several years. The patient reports that she does no have any pain with regular injections of Exparel to the ankle, posterior tibial, and common peroneal nerves. Patient does have a new complaint today of an ingrown toe nail on the tibial border of her hallux nail right foot. She reports that there has been some minimal drainage, and she has tried to dig out the nail herself however she has been unsuccessful. The patient has no other pedal complaints at this time.     Objective    General: Patient is A+O x 4 in NAD    Vasculature: Dp and PT pulses are 2+/4, CFT < 3 seconds, pedal hair growth present.     Dermatological: There are no open lesions, skin is warm and moist with good turgor.     Neurological: Patient has hyperesthesia on the dorsum of the right foot. The patient has decreased protective sensation on the bottom of the right foot.     Musculoskeletal: patient has 4/5 muscle strength with dorsiflexion plantarflexion, inversion, and eversion of the right foot.     Assessment  42 year old female with right foot pain secondary to RSD.     Plan  Today the plan is to repeat the injections as described using Exparel. She appears to be getting 2-3 weeks out of a single injection as compared to less than 1 week using standard bupivacaine.     Injection consisted of Exparel 5ml to the ankle posterior to the tibial nerve and common peroneal nerve with 0.25 ml of dexamethasone. She tolerated the injection well without any complications noted.     Injection consisted of 5cc lidocaine 1% into the medial and lateral borders of the hallux right foot. Then using a hemostat, and english anvil the offending border of the nail on the tibial side of the hallux nail was removed. Then the nail border was covered with bacitracin ointment, 2x2's and 2 inch  gauze.     Plan is for her to follow up with [place] on a PRN basis.

## 2013-01-22 ENCOUNTER — Encounter (HOSPITAL_BASED_OUTPATIENT_CLINIC_OR_DEPARTMENT_OTHER): Payer: Self-pay | Admitting: Internal Medicine

## 2013-01-30 MED ORDER — DEXAMETHASONE SODIUM PHOSPHATE 4 MG/ML IJ SOLN
2.00 mg | Freq: Once | INTRAMUSCULAR | Status: AC
Start: 2013-01-30 — End: 2013-01-30

## 2013-01-30 MED ORDER — BUPIVACAINE LIPOSOME 1.3 % IJ SUSP
1.0000 mL | Freq: Once | INTRAMUSCULAR | Status: DC
Start: 2013-01-30 — End: 2013-10-01

## 2013-01-30 NOTE — Progress Notes (Signed)
Foot Pain            Subjective    HPI: 42 year old female presents to the clinic with continued right foot pain secondary to RSD. The patient reports that the pain has been there for several years. The patient reports that she does no have any pain with regular injections of Exparel to the ankle, posterior tibial, and common peroneal nerves. Patient does have a new complaint today of an ingrown toe nail on the tibial border of her hallux nail right foot. She reports that there has been some minimal drainage, and she has tried to dig out the nail herself however she has been unsuccessful. The patient has no other pedal complaints at this time.     Objective    General: Patient is A+O x 4 in NAD    Vasculature: Dp and PT pulses are 2+/4, CFT < 3 seconds, pedal hair growth present.     Dermatological: There are no open lesions, skin is warm and moist with good turgor.     Neurological: Patient has hyperesthesia on the dorsum of the right foot. The patient has decreased protective sensation on the bottom of the right foot.     Musculoskeletal: patient has 4/5 muscle strength with dorsiflexion plantarflexion, inversion, and eversion of the right foot.     Assessment  42 year old female with right foot pain secondary to RSD.     Plan  Today the plan is to repeat the injections as described using Exparel. She appears to be getting 2-3 weeks out of a single injection as compared to less than 1 week using standard bupivacaine.     Injection consisted of Exparel 5ml to the ankle posterior to the tibial nerve and common peroneal nerve with 0.25 ml of dexamethasone. She tolerated the injection well without any complications noted.     Injection consisted of 5cc lidocaine 1% into the medial and lateral borders of the hallux right foot. Then using a hemostat, and english anvil the offending border of the nail on the tibial side of the hallux nail was removed. Then the nail border was covered with bacitracin ointment, 2x2's and  2 inch gauze.     Plan is for her to follow up with [place] on a PRN basis.

## 2013-02-13 ENCOUNTER — Ambulatory Visit (HOSPITAL_BASED_OUTPATIENT_CLINIC_OR_DEPARTMENT_OTHER): Payer: Self-pay | Admitting: Internal Medicine

## 2013-02-14 ENCOUNTER — Ambulatory Visit (HOSPITAL_BASED_OUTPATIENT_CLINIC_OR_DEPARTMENT_OTHER): Payer: Self-pay | Admitting: Podiatrist

## 2013-02-14 DIAGNOSIS — S9030XA Contusion of unspecified foot, initial encounter: Secondary | ICD-10-CM

## 2013-02-14 DIAGNOSIS — M79673 Pain in unspecified foot: Secondary | ICD-10-CM

## 2013-02-14 DIAGNOSIS — G9050 Complex regional pain syndrome I, unspecified: Secondary | ICD-10-CM

## 2013-02-14 MED ORDER — ZOLPIDEM TARTRATE 10 MG PO TABS
10.0000 mg | ORAL_TABLET | Freq: Every evening | ORAL | Status: DC | PRN
Start: 2013-02-14 — End: 2013-03-16

## 2013-02-14 MED ORDER — DIAZEPAM 5 MG PO TABS
5.0000 mg | ORAL_TABLET | Freq: Every evening | ORAL | Status: DC | PRN
Start: 2013-02-14 — End: 2013-04-19

## 2013-02-14 NOTE — Progress Notes (Cosign Needed)
HPI: 42 year old female patient presents today for f/u of her RSD in the right foot. She states that she has found significant relief with the injections of Exparel she has been receiving on a monthly basis.     ROS: Patient denies constitutional symptoms.     Physical exam:  General: Patient is AAO x3 and is in NAD.     Vascular: Dorsalis pedis and posterior tibial pulses are 2/4. CRT <3 seconds.     Derm: Skin temperature and turgor are WNL. Nails are normal in appearance.     Ortho: MMT 5/5 in all quadrants.    Neuro: Hyperesthesia to the dorsum of the right foot. Diminished protective sensation plantar aspect of  Right foot.    A/P: 42 year old female patient with right foot pain secondary to RSD.    P:   Injections were administered using 5 mL of Exparel to the tibial nerve and 2.5 mL of Dexamethasone to the right common peroneal nerve.   The patient will follow up on a PRN basis.

## 2013-02-25 MED ORDER — DEXAMETHASONE SODIUM PHOSPHATE 4 MG/ML IJ SOLN
2.00 mg | Freq: Once | INTRAMUSCULAR | Status: AC
Start: 2013-02-25 — End: 2013-02-25

## 2013-02-25 NOTE — Progress Notes (Signed)
HPI: 42 year old female patient presents today for f/u of her RSD in the right foot. She states that she has found significant relief with the injections of Exparel she has been receiving on a monthly basis.     ROS: Patient denies constitutional symptoms.     Physical exam:  General: Patient is AAO x3 and is in NAD.     Vascular: Dorsalis pedis and posterior tibial pulses are 2/4. CRT <3 seconds.     Derm: Skin temperature and turgor are WNL. Nails are normal in appearance.     Ortho: MMT 5/5 in all quadrants.    Neuro: Hyperesthesia to the dorsum of the right foot. Diminished protective sensation plantar aspect of  Right foot.    A/P: 42 year old female patient with right foot pain secondary to RSD.    P:   Injections were administered using 5 mL of Exparel to the tibial nerve and 0.5 mL of Dexamethasone to the right common peroneal nerve, right ankle, and right common peroneal nerves.  Each site received the full injection of 5ml Exparel + 0.5 ml of Dexamethasone. She tolerated the injections without any complications.  Overall, she continues to show improvement, and the injections are becoming further and further apart.  The patient will follow up on a PRN basis.

## 2013-03-04 ENCOUNTER — Encounter (HOSPITAL_BASED_OUTPATIENT_CLINIC_OR_DEPARTMENT_OTHER): Payer: Self-pay | Admitting: Internal Medicine

## 2013-03-04 DIAGNOSIS — I10 Essential (primary) hypertension: Secondary | ICD-10-CM

## 2013-03-04 DIAGNOSIS — E039 Hypothyroidism, unspecified: Secondary | ICD-10-CM

## 2013-03-05 NOTE — Telephone Encounter (Signed)
Message from MyChart:  Original authorizing provider: Mitchell Heir, MD    [name] would like a refill of the following medications:  SYNTHROID 125 MCG tablet Mitchell Heir, MD]  hydrochlorothiazide (HYDRODIURIL) 25 MG tablet Mitchell Heir, MD]    Preferred pharmacy: [facility] [address] - [place], [place] - [address]    Comment:

## 2013-03-05 NOTE — Telephone Encounter (Signed)
Madison Mckay is a 42 year old female calling to request a refill of HCTZ and synthroid.  HTN Med:    Most Recent BP [place](s)  01/15/13 : 132/88  12/01/12 : 128/91  10/23/12 : 141/84  Thyroid Med:  TSH:TSH (THYROID STIM HORMONE) (uIU/mL)   Date  Value    07/06/2010  0.46    ---------- TSH Screen:No results found for this basename: TSHSC      Documented patient preferred pharmacies:    [facility] - [id number] - [place], [place] - [address]  Phone: [phone] Fax: [phone]

## 2013-03-06 MED ORDER — HYDROCHLOROTHIAZIDE 25 MG PO TABS
25.00 mg | ORAL_TABLET | Freq: Every day | ORAL | Status: AC
Start: 2013-03-05 — End: 2016-03-31

## 2013-03-06 MED ORDER — SYNTHROID 125 MCG PO TABS
125.00 ug | ORAL_TABLET | Freq: Every morning | ORAL | Status: AC
Start: 2013-03-05 — End: 2016-03-31

## 2013-03-07 ENCOUNTER — Telehealth (HOSPITAL_BASED_OUTPATIENT_CLINIC_OR_DEPARTMENT_OTHER): Payer: Self-pay | Admitting: Internal Medicine

## 2013-03-08 NOTE — Progress Notes (Signed)
Tried to contact patient by phone.  Unable to reach her and mailbox is full.

## 2013-03-16 ENCOUNTER — Encounter (HOSPITAL_BASED_OUTPATIENT_CLINIC_OR_DEPARTMENT_OTHER): Payer: Self-pay | Admitting: Podiatrist

## 2013-03-16 ENCOUNTER — Ambulatory Visit (HOSPITAL_BASED_OUTPATIENT_CLINIC_OR_DEPARTMENT_OTHER): Payer: Self-pay | Admitting: Podiatrist

## 2013-03-16 VITALS — BP 118/69 | HR 72 | Temp 98.5°F

## 2013-03-16 DIAGNOSIS — G9050 Complex regional pain syndrome I, unspecified: Secondary | ICD-10-CM

## 2013-03-16 MED ORDER — ZOLPIDEM TARTRATE 10 MG PO TABS
10.0000 mg | ORAL_TABLET | Freq: Every evening | ORAL | Status: DC | PRN
Start: 2013-03-16 — End: 2013-04-19

## 2013-03-17 NOTE — Progress Notes (Signed)
Date of Service: 03/16/2013    SUBJECTIVE:  The patient presents to the office today with a chief concern of pain in her right foot.  She continues to have difficulties associated with reflex sympathetic dystrophy, although her injections appear to be further and further apart.  She denies fever, chills, nausea, vomiting, night sweats, shortness of breath, and calf pain.      She has palpable DP and PT pulses bilaterally.  Sensorium is grossly intact with some slight hypersensitivity to touch.  She has 5/5 muscle strength bilaterally.  Foot exhibits almost normal distal cooling as well.    PAST MEDICAL HISTORY:  Also includes some recent paresthesia type symptoms in her contralateral left limb.  These appear to have resolved and only lasted for a day or two.    Based on clinical examination today, I have recommended repeat injection with Exparel.  This will consist of 3 nerve blocks at the posterior tibial, common peroneal and around the ankle.  In addition, I have added a quarter  _____ mL of dexamethasone to each injection site.  She tolerated the injections well without any complications noted.  Her last injection was approximately 30 days ago.  This is the second month in a row where she has been able to go a full 30 days.  Prior to that she was only able to tolerate about 3 weeks.      Plan is to follow up with her on a p.r.n. basis.    ___________________________  Reviewed and Electronically Signed By: Naoma Diener DPM  Sig Date: 03/20/2013  Sig Time: 08:13:05  Dictated By: Naoma Diener DPM  Dict Date: 03/17/2013 Dict Time: 03 38 PM    Dictation Date and Time:03/17/2013 15:38:25  Transcription Date and Time:03/17/2013 16:29:11  eScription Dictation id: [id number] Confirmation # :[id number]

## 2013-03-18 MED ORDER — DEXAMETHASONE SODIUM PHOSPHATE 4 MG/ML IJ SOLN
2.00 mg | Freq: Once | INTRAMUSCULAR | Status: AC
Start: 2013-03-18 — End: 2013-03-18

## 2013-03-18 MED ORDER — BUPIVACAINE LIPOSOME 1.3 % IJ SUSP
1.00 mL | Freq: Once | INTRAMUSCULAR | Status: AC
Start: 2013-03-18 — End: 2013-03-18
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
## 2013-04-19 ENCOUNTER — Encounter (HOSPITAL_BASED_OUTPATIENT_CLINIC_OR_DEPARTMENT_OTHER): Payer: Self-pay | Admitting: Podiatrist

## 2013-04-19 ENCOUNTER — Ambulatory Visit (HOSPITAL_BASED_OUTPATIENT_CLINIC_OR_DEPARTMENT_OTHER): Payer: Self-pay | Admitting: Podiatrist

## 2013-04-19 VITALS — BP 136/92 | HR 67 | Temp 98.2°F

## 2013-04-19 DIAGNOSIS — G5781 Other specified mononeuropathies of right lower limb: Secondary | ICD-10-CM

## 2013-04-19 DIAGNOSIS — G90521 Complex regional pain syndrome I of right lower limb: Secondary | ICD-10-CM

## 2013-04-19 MED ORDER — DIAZEPAM 5 MG PO TABS
5.0000 mg | ORAL_TABLET | Freq: Every evening | ORAL | Status: DC | PRN
Start: 2013-04-19 — End: 2013-05-09

## 2013-04-19 MED ORDER — BUPIVACAINE LIPOSOME 1.3 % IJ SUSP
1.0000 mL | Freq: Once | INTRAMUSCULAR | Status: DC
Start: 2013-04-19 — End: 2014-01-16

## 2013-04-19 MED ORDER — ZOLPIDEM TARTRATE 10 MG PO TABS
10.0000 mg | ORAL_TABLET | Freq: Every evening | ORAL | Status: DC | PRN
Start: 2013-04-19 — End: 2013-05-09

## 2013-04-19 MED ORDER — DEXAMETHASONE SODIUM PHOSPHATE 4 MG/ML IJ SOLN
2.00 mg | Freq: Once | INTRAMUSCULAR | Status: AC
Start: 2013-04-19 — End: 2013-04-19
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
## 2013-05-07 ENCOUNTER — Telehealth (HOSPITAL_BASED_OUTPATIENT_CLINIC_OR_DEPARTMENT_OTHER): Payer: Self-pay

## 2013-05-07 ENCOUNTER — Other Ambulatory Visit (HOSPITAL_BASED_OUTPATIENT_CLINIC_OR_DEPARTMENT_OTHER): Payer: Self-pay | Admitting: Podiatrist

## 2013-05-07 MED ORDER — TRAMADOL HCL 50 MG PO TABS
50.0000 mg | ORAL_TABLET | Freq: Two times a day (BID) | ORAL | Status: DC | PRN
Start: 2013-05-07 — End: 2013-05-09

## 2013-05-07 NOTE — Progress Notes (Signed)
Pharmacy called requesting a hardcopy of the prescription for tramadol.  A copy was faxed to the pharmacy as requested.

## 2013-05-07 NOTE — Telephone Encounter (Signed)
Message copied by Durel Salts on Mon May 07, 2013  6:33 PM  ------       Message from: NESMACHNOV, Sue Lush       Created: Mon May 07, 2013  4:17 PM       Regarding: script- Dr Rosilyn Mings         [facility]              Person calling on behalf of patient: Pharmacy              May list multiple medications in this section              Medicine Name: traMADol (ULTRAM) 50 MG tablet              Please mail hard copy to [facility]              Documented patient preferred pharmacies:        [facility] - [address] - [place], [place] - [address]       Phone: [phone] Fax: [phone]                                     ------

## 2013-05-09 ENCOUNTER — Ambulatory Visit (HOSPITAL_BASED_OUTPATIENT_CLINIC_OR_DEPARTMENT_OTHER): Payer: Self-pay | Admitting: Podiatrist

## 2013-05-09 ENCOUNTER — Encounter (HOSPITAL_BASED_OUTPATIENT_CLINIC_OR_DEPARTMENT_OTHER): Payer: Self-pay | Admitting: Podiatrist

## 2013-05-09 DIAGNOSIS — S8401XS Injury of tibial nerve at lower leg level, right leg, sequela: Principal | ICD-10-CM

## 2013-05-09 DIAGNOSIS — IMO0001 Reserved for inherently not codable concepts without codable children: Secondary | ICD-10-CM

## 2013-05-09 DIAGNOSIS — S81801S Unspecified open wound, right lower leg, sequela: Secondary | ICD-10-CM

## 2013-05-09 DIAGNOSIS — G5791 Unspecified mononeuropathy of right lower limb: Secondary | ICD-10-CM

## 2013-05-09 DIAGNOSIS — S8411XS Injury of peroneal nerve at lower leg level, right leg, sequela: Secondary | ICD-10-CM

## 2013-05-09 MED ORDER — TRAMADOL HCL 50 MG PO TABS
50.0000 mg | ORAL_TABLET | Freq: Two times a day (BID) | ORAL | Status: DC | PRN
Start: 2013-05-09 — End: 2013-09-06

## 2013-05-09 MED ORDER — ZOLPIDEM TARTRATE 10 MG PO TABS
10.0000 mg | ORAL_TABLET | Freq: Every evening | ORAL | Status: DC | PRN
Start: 2013-05-09 — End: 2013-06-28

## 2013-05-09 MED ORDER — DIAZEPAM 5 MG PO TABS
10.0000 mg | ORAL_TABLET | Freq: Every evening | ORAL | Status: DC | PRN
Start: 2013-05-09 — End: 2013-06-28

## 2013-05-09 NOTE — Progress Notes (Cosign Needed)
42 yo F returns to clinic for continued RSD therapeutic nerve blocks to RIGHT foot. Relates increase in pain recently with inability to sleep for past 3 days. Also reports no changes in health or medications since last visit 04/19/13 and no other pedal complaints.    O: RLE focused exam  Vasc: DP and PT pulses palpable, no edema noted.  Derm: Pedal skin of normal texture/temperature/turgor with diffuse, blotchy, trace erythema noted. No dermal atrophy noted.  Neuro: + Allodynia to dorsal foot with light touch.  MSK: No gross pedal deformities noted.    A/P: 42 yo F with RIGHT foot RSD.  -Injections: Following sterile preparation with alcohol pad, anesthetic mixture of 5cc Exparel and 0.25cc Dexamethasone was injected near the RIGHT tibial nerve. The process was repeated for the RIGHT common peroneal nerve and the RIGHT ankle, each with another 5.25cc of the aforementioned anesthetic mixture. Injection sites were dressed with band-aid.  -Rx: Ultram 50mg , Valium 10mg , and Ambien 5mg   -F/U PRN    Patient seen and discussed with attending, Dr. Rosilyn Mings

## 2013-05-12 MED ORDER — DEXAMETHASONE SODIUM PHOSPHATE 4 MG/ML IJ SOLN
2.00 mg | Freq: Once | INTRAMUSCULAR | Status: AC
Start: 2013-05-12 — End: 2013-05-12

## 2013-05-12 MED ORDER — BUPIVACAINE LIPOSOME 1.3 % IJ SUSP
1.00 mL | Freq: Once | INTRAMUSCULAR | Status: AC
Start: 2013-05-12 — End: 2013-05-12
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
## 2013-06-28 ENCOUNTER — Ambulatory Visit (HOSPITAL_BASED_OUTPATIENT_CLINIC_OR_DEPARTMENT_OTHER): Payer: PRIVATE HEALTH INSURANCE | Admitting: Podiatrist

## 2013-06-28 ENCOUNTER — Encounter (HOSPITAL_BASED_OUTPATIENT_CLINIC_OR_DEPARTMENT_OTHER): Payer: Self-pay | Admitting: Podiatrist

## 2013-06-28 DIAGNOSIS — G9050 Complex regional pain syndrome I, unspecified: Secondary | ICD-10-CM

## 2013-06-28 MED ORDER — DIAZEPAM 5 MG PO TABS
10.0000 mg | ORAL_TABLET | Freq: Every evening | ORAL | Status: DC | PRN
Start: 2013-06-28 — End: 2013-08-02

## 2013-06-28 MED ORDER — ZOLPIDEM TARTRATE 10 MG PO TABS
10.0000 mg | ORAL_TABLET | Freq: Every evening | ORAL | Status: DC | PRN
Start: 2013-06-28 — End: 2013-08-02

## 2013-07-04 MED ORDER — DEXAMETHASONE SODIUM PHOSPHATE 4 MG/ML IJ SOLN
2.00 mg | Freq: Once | INTRAMUSCULAR | Status: AC
Start: 2013-07-04 — End: 2013-07-04

## 2013-07-04 MED ORDER — BUPIVACAINE LIPOSOME 1.3 % IJ SUSP
5.00 mL | Freq: Once | INTRAMUSCULAR | Status: AC
Start: 2013-07-04 — End: 2013-07-04

## 2013-07-04 NOTE — Progress Notes (Signed)
Date of Service: 06/28/2013    The patient presents to the office today for followup for reflex sympathetic dystrophy in her right foot and lower leg.  She reports that until about 4 days ago, she was functioning essentially completely normal and then began to have sudden sharp and unbearable pains.  She also reports that she was having difficulty, once the pain began, to sleep through the night.    Past medical history is otherwise unchanged since her last visit.  She denies fever, chills, nausea, vomiting, night sweats, shortness of breath, and calf pain.  She has been exercising on a regular basis prior to the beginning of the pain again.  She reports that the pain has been extremely severe at this time.  In the past, we have had excellent results from nerve blocks to the common peroneal, posterior tibial, and also an ankle block.  Today, we repeated these 3 nerve blocks utilizing Exparel 5 mL at each site in combination with 0.25 mL of dexamethasone at each side.  She tolerated the injections well without any complications noted.  We will have her follow up with us on a p.r.n. basis.  She appears to be able to stretch out the time between injections further and further, and I feel this is an encouraging development.    ___________________________  Reviewed and Electronically Signed By: Naoma DienerADAM S Melbourne Jakubiak DPM  Sig Date: 07/04/2013  Sig Time: 22:14:25  Dictated By: Naoma DienerADAM S Kaylia Winborne DPM  Dict Date: 07/04/2013 Dict Time: 01 25 PM    Dictation Date and Time:07/04/2013 13:25:07  Transcription Date and Time:07/04/2013 13:55:01  eScription Dictation id: [id number] Confirmation # :[id number]

## 2013-07-18 MED ORDER — AZITHROMYCIN 250 MG PO TABS
250.00 mg | ORAL_TABLET | Freq: Every day | ORAL | Status: AC
Start: 2013-07-18 — End: 2013-07-23

## 2013-08-02 ENCOUNTER — Encounter (HOSPITAL_BASED_OUTPATIENT_CLINIC_OR_DEPARTMENT_OTHER): Payer: Self-pay | Admitting: Podiatrist

## 2013-08-02 ENCOUNTER — Ambulatory Visit (HOSPITAL_BASED_OUTPATIENT_CLINIC_OR_DEPARTMENT_OTHER): Payer: Self-pay | Admitting: Podiatrist

## 2013-08-02 DIAGNOSIS — S9031XD Contusion of right foot, subsequent encounter: Secondary | ICD-10-CM

## 2013-08-02 DIAGNOSIS — G9050 Complex regional pain syndrome I, unspecified: Secondary | ICD-10-CM

## 2013-08-02 DIAGNOSIS — M79671 Pain in right foot: Secondary | ICD-10-CM

## 2013-08-02 DIAGNOSIS — Z5189 Encounter for other specified aftercare: Secondary | ICD-10-CM

## 2013-08-02 MED ORDER — BUPIVACAINE LIPOSOME 1.3 % IJ SUSP
1.00 mL | Freq: Once | INTRAMUSCULAR | Status: AC
Start: 2013-08-02 — End: 2013-08-02

## 2013-08-02 MED ORDER — DEXAMETHASONE SODIUM PHOSPHATE 4 MG/ML IJ SOLN
2.00 mg | Freq: Once | INTRAMUSCULAR | Status: AC
Start: 2013-08-02 — End: 2013-08-02

## 2013-08-02 MED ORDER — DIAZEPAM 5 MG PO TABS
10.0000 mg | ORAL_TABLET | Freq: Every evening | ORAL | Status: DC | PRN
Start: 2013-08-02 — End: 2013-09-06

## 2013-08-02 MED ORDER — ZOLPIDEM TARTRATE 10 MG PO TABS
10.0000 mg | ORAL_TABLET | Freq: Every evening | ORAL | Status: DC | PRN
Start: 2013-08-02 — End: 2013-09-06

## 2013-08-02 NOTE — Progress Notes (Signed)
This office note has been dictated. Account number [id number]
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
## 2013-09-06 ENCOUNTER — Encounter (HOSPITAL_BASED_OUTPATIENT_CLINIC_OR_DEPARTMENT_OTHER): Payer: Self-pay | Admitting: Podiatrist

## 2013-09-06 ENCOUNTER — Ambulatory Visit (HOSPITAL_BASED_OUTPATIENT_CLINIC_OR_DEPARTMENT_OTHER): Payer: Self-pay | Admitting: Podiatrist

## 2013-09-06 DIAGNOSIS — G9050 Complex regional pain syndrome I, unspecified: Secondary | ICD-10-CM

## 2013-09-06 MED ORDER — ZOLPIDEM TARTRATE 10 MG PO TABS
10.0000 mg | ORAL_TABLET | Freq: Every evening | ORAL | Status: DC | PRN
Start: 2013-09-06 — End: 2013-10-12

## 2013-09-06 MED ORDER — TRAMADOL HCL 50 MG PO TABS
50.0000 mg | ORAL_TABLET | Freq: Two times a day (BID) | ORAL | Status: DC | PRN
Start: 2013-09-06 — End: 2013-11-14

## 2013-09-06 MED ORDER — DIAZEPAM 5 MG PO TABS
10.0000 mg | ORAL_TABLET | Freq: Every evening | ORAL | Status: DC | PRN
Start: 2013-09-06 — End: 2013-10-12
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
## 2013-10-01 MED ORDER — BUPIVACAINE LIPOSOME 1.3 % IJ SUSP
1.00 mL | Freq: Once | INTRAMUSCULAR | Status: AC
Start: 2013-10-01 — End: 2013-10-01

## 2013-10-01 MED ORDER — DEXAMETHASONE SODIUM PHOSPHATE 4 MG/ML IJ SOLN
2.00 mg | Freq: Once | INTRAMUSCULAR | Status: AC
Start: 2013-10-01 — End: 2013-10-01

## 2013-10-01 MED ORDER — BUPIVACAINE LIPOSOME 1.3 % IJ SUSP
1.0000 mL | Freq: Once | INTRAMUSCULAR | Status: DC
Start: 2013-10-01 — End: 2014-01-16

## 2013-10-01 NOTE — Progress Notes (Signed)
SUBJECTIVE: The patient presents to the office today for followup for chronic pain in her right foot and lower leg. She has previously been diagnosed with reflex sympathetic dystrophy following a contusion to her right foot and lower leg. She believes she got approximately 6 weeks of relief from her last regional nerve block. She is here today because the pain has now come back and is approximately 5 or 6/10.   OBJECTIVE: Clinical examination reveals palpable DP and PT pulses bilaterally. Sensorium is slightly diminished on the right side. She has 5/5 muscle strength. The foot is slightly cooler on the right compared to the left. Treatment plan for today is nerve blocks to the posterior tibial, common peroneal, and to the ankle of the right leg and foot using approximately 5 mL of Exparel 1.3% suspension with 0.25 mL of dexamethasone 4 mg/mL to each site.   ASSESSMENT AND PLAN: She tolerated the 3 nerve blocks without any complications. Plan is for her to follow up with us on a p.r.n. basis. Hopefully, this will be approximately 6 weeks or more.   PAST MEDICAL HISTORY: Otherwise unchanged.

## 2013-10-12 ENCOUNTER — Encounter (HOSPITAL_BASED_OUTPATIENT_CLINIC_OR_DEPARTMENT_OTHER): Payer: Self-pay | Admitting: Podiatrist

## 2013-10-12 ENCOUNTER — Ambulatory Visit (HOSPITAL_BASED_OUTPATIENT_CLINIC_OR_DEPARTMENT_OTHER): Payer: PRIVATE HEALTH INSURANCE | Admitting: Podiatrist

## 2013-10-12 DIAGNOSIS — S9031XS Contusion of right foot, sequela: Secondary | ICD-10-CM

## 2013-10-12 DIAGNOSIS — G90529 Complex regional pain syndrome I of unspecified lower limb: Secondary | ICD-10-CM

## 2013-10-12 DIAGNOSIS — IMO0002 Reserved for concepts with insufficient information to code with codable children: Secondary | ICD-10-CM

## 2013-10-12 DIAGNOSIS — G90521 Complex regional pain syndrome I of right lower limb: Secondary | ICD-10-CM

## 2013-10-12 MED ORDER — ZOLPIDEM TARTRATE 10 MG PO TABS
10.0000 mg | ORAL_TABLET | Freq: Every evening | ORAL | Status: DC | PRN
Start: 2013-10-12 — End: 2013-11-14

## 2013-10-12 MED ORDER — DIAZEPAM 5 MG PO TABS
10.0000 mg | ORAL_TABLET | Freq: Every evening | ORAL | Status: DC | PRN
Start: 2013-10-12 — End: 2013-11-14

## 2013-11-02 MED ORDER — BUPIVACAINE LIPOSOME 1.3 % IJ SUSP
1.00 mL | Freq: Once | INTRAMUSCULAR | Status: AC
Start: 2013-11-02 — End: 2013-11-02

## 2013-11-02 MED ORDER — DEXAMETHASONE SODIUM PHOSPHATE 4 MG/ML IJ SOLN
2.00 mg | Freq: Once | INTRAMUSCULAR | Status: AC
Start: 2013-11-02 — End: 2013-11-02

## 2013-11-02 NOTE — Progress Notes (Signed)
.  dict
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
## 2013-11-14 ENCOUNTER — Ambulatory Visit (HOSPITAL_BASED_OUTPATIENT_CLINIC_OR_DEPARTMENT_OTHER): Payer: Self-pay | Admitting: Podiatrist

## 2013-11-14 ENCOUNTER — Encounter (HOSPITAL_BASED_OUTPATIENT_CLINIC_OR_DEPARTMENT_OTHER): Payer: Self-pay | Admitting: Podiatrist

## 2013-11-14 DIAGNOSIS — G8929 Other chronic pain: Secondary | ICD-10-CM

## 2013-11-14 DIAGNOSIS — M79671 Pain in right foot: Secondary | ICD-10-CM

## 2013-11-14 DIAGNOSIS — G9050 Complex regional pain syndrome I, unspecified: Secondary | ICD-10-CM

## 2013-11-14 MED ORDER — DIAZEPAM 5 MG PO TABS
10.0000 mg | ORAL_TABLET | Freq: Every evening | ORAL | Status: DC | PRN
Start: 2013-11-14 — End: 2014-01-16

## 2013-11-14 MED ORDER — ZOLPIDEM TARTRATE 10 MG PO TABS
10.0000 mg | ORAL_TABLET | Freq: Every evening | ORAL | Status: DC | PRN
Start: 2013-11-14 — End: 2013-12-17

## 2013-11-14 MED ORDER — TRAMADOL HCL 50 MG PO TABS
50.0000 mg | ORAL_TABLET | Freq: Two times a day (BID) | ORAL | Status: DC | PRN
Start: 2013-11-14 — End: 2014-02-18

## 2013-11-15 NOTE — Progress Notes (Signed)
Date of Service: 11/14/2013    SUBJECTIVE:  The patient presents to the office today with a chief concern of pain on the right foot and lower leg.  She continues to get long periods of relief with Exparel, however, her pain has now returned.  She continues to lose weight and is able to tolerate more exercise.    PAST MEDICAL HISTORY:  Otherwise, unchanged.    REVIEW OF SYSTEMS:  She denies fever, chills, nausea, vomiting, night sweats, shortness of breath, and calf pain.    PHYSICAL EXAMINATION:  She has palpable DP and PT pulses bilaterally.  Sensorium appears to be grossly intact.  She has 5/5 muscle strength bilaterally.  She has some slight increase in distal cooling on the right foot as compared to the left.  These are often consistent with chronic regional pain syndrome.  Today, 3 nerve conduction blocks were performed at the posterior tibial, common peroneal, and the ankle, all on the right foot.  She tolerated the 3 injections well.  One-quarter mL of dexamethasone was added to each injection site as well.    PLAN:  Is for her to follow up with [place] on a p.r.n. basis.    ___________________________  Reviewed and Electronically Signed By: Naoma Diener DPM  Sig Date: 11/15/2013  Sig Time: 10:17:58  Dictated By: Naoma Diener DPM  Dict Date: 11/14/2013 Dict Time: 10 49 PM    Dictation Date and Time:11/14/2013 22:49:54  Transcription Date and Time:11/15/2013 08:14:52  eScription Dictation id: [id number] Confirmation # :[id number]

## 2013-12-02 MED ORDER — BUPIVACAINE LIPOSOME 1.3 % IJ SUSP
1.0000 mL | Freq: Once | INTRAMUSCULAR | Status: DC
Start: 2013-12-02 — End: 2014-01-16

## 2013-12-02 MED ORDER — DEXAMETHASONE SODIUM PHOSPHATE 4 MG/ML IJ SOLN
2.00 mg | Freq: Once | INTRAMUSCULAR | Status: AC
Start: 2013-12-02 — End: 2013-12-02

## 2013-12-17 ENCOUNTER — Ambulatory Visit (HOSPITAL_BASED_OUTPATIENT_CLINIC_OR_DEPARTMENT_OTHER): Payer: PRIVATE HEALTH INSURANCE | Admitting: Podiatrist

## 2013-12-17 ENCOUNTER — Encounter (HOSPITAL_BASED_OUTPATIENT_CLINIC_OR_DEPARTMENT_OTHER): Payer: Self-pay | Admitting: Podiatrist

## 2013-12-17 DIAGNOSIS — G9050 Complex regional pain syndrome I, unspecified: Secondary | ICD-10-CM

## 2013-12-17 DIAGNOSIS — S9031XD Contusion of right foot, subsequent encounter: Secondary | ICD-10-CM

## 2013-12-17 MED ORDER — ZOLPIDEM TARTRATE 10 MG PO TABS
10.0000 mg | ORAL_TABLET | Freq: Every evening | ORAL | Status: DC | PRN
Start: 2013-12-17 — End: 2014-02-18

## 2013-12-17 NOTE — Progress Notes (Addendum)
Subjective:  CC: Right foot and leg pain  HPI: 43 year old female presents to clinic complaining of right foot and leg pain.
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
## 2014-01-16 ENCOUNTER — Ambulatory Visit (HOSPITAL_BASED_OUTPATIENT_CLINIC_OR_DEPARTMENT_OTHER): Payer: No Typology Code available for payment source | Admitting: Podiatrist

## 2014-01-16 DIAGNOSIS — M79671 Pain in right foot: Secondary | ICD-10-CM

## 2014-01-16 DIAGNOSIS — G9050 Complex regional pain syndrome I, unspecified: Secondary | ICD-10-CM

## 2014-01-16 MED ORDER — ZOLPIDEM TARTRATE 10 MG PO TABS
10.0000 mg | ORAL_TABLET | Freq: Every evening | ORAL | Status: AC | PRN
Start: 2014-01-16 — End: 2014-02-15

## 2014-01-16 MED ORDER — DIAZEPAM 5 MG PO TABS
10.0000 mg | ORAL_TABLET | Freq: Every evening | ORAL | Status: DC | PRN
Start: 2014-01-16 — End: 2014-02-18

## 2014-01-16 MED ORDER — BUPIVACAINE LIPOSOME 1.3 % IJ SUSP
1.00 mL | Freq: Once | INTRAMUSCULAR | Status: AC
Start: 2014-01-16 — End: 2014-01-16

## 2014-01-16 MED ORDER — DEXAMETHASONE SODIUM PHOSPHATE 4 MG/ML IJ SOLN
2.00 mg | Freq: Once | INTRAMUSCULAR | Status: AC
Start: 2014-01-16 — End: 2014-01-16

## 2014-01-16 MED FILL — Bupivacaine Liposome Inj 1.3% (13.3 MG/ML): INTRAMUSCULAR | Qty: 20 | Status: CN
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
## 2014-01-16 NOTE — Progress Notes (Signed)
S: 43 year old female returns to clinic complaining of painful right foot. She has been diagnosed with RDS due to a traumatic episode to the right foot 4 years ago. She rates the pain as 8/10 when the pain medication wears off and describes it as burning and shooting.     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

## 2014-01-29 ENCOUNTER — Other Ambulatory Visit (HOSPITAL_BASED_OUTPATIENT_CLINIC_OR_DEPARTMENT_OTHER): Payer: Self-pay | Admitting: Podiatrist

## 2014-01-29 MED ORDER — AZITHROMYCIN 250 MG PO TABS
250.00 mg | ORAL_TABLET | Freq: Every day | ORAL | Status: AC
Start: 2014-01-29 — End: 2014-02-03

## 2014-01-30 ENCOUNTER — Telehealth (HOSPITAL_BASED_OUTPATIENT_CLINIC_OR_DEPARTMENT_OTHER): Payer: Self-pay

## 2014-01-30 MED ORDER — DEXAMETHASONE SODIUM PHOSPHATE 4 MG/ML IJ SOLN
2.00 mg | Freq: Once | INTRAMUSCULAR | Status: AC
Start: 2014-01-30 — End: 2014-01-30

## 2014-01-30 MED ORDER — BUPIVACAINE LIPOSOME 1.3 % IJ SUSP
1.0000 mL | Freq: Once | INTRAMUSCULAR | Status: DC
Start: 2014-01-30 — End: 2017-09-27

## 2014-01-30 NOTE — Progress Notes (Signed)
Received a call from the pharmacy regarding the dosing for patient Madison Mckay's Azithromycin 250 mg. Typically it is dosed as 2 tablets on the first day and 1 tablet each day thereafter. I instructed the pharmacist that dispensing the medication as it is typically would be fine. The pharmacist was advised to call back [phone] with any additional questions.

## 2014-01-30 NOTE — Progress Notes (Signed)
S: 43 year old female returns to clinic complaining of painful right foot. She has been diagnosed with RDS due to a traumatic episode to the right foot 4 years ago. She rates the pain as 8/10 when the pain medication wears off and describes it as burning and shooting.     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

## 2014-01-30 NOTE — Telephone Encounter (Signed)
-----   Message from Judye Bos sent at 01/30/2014  9:26 AM EDT -----  Regarding: Reg patient of Dr Rosilyn Mings pharmacy calling want to confirm instructions for med   Contact: [phone]      Arlie Posch [id number], 43 year old, female, Telephone Information:  Home Phone      [phone]  Work Phone      Not on file.  Mobile          [phone]      Patient's Preferred Pharmacy:     [facility] - [address] - [place], [place] - [address]  Phone: [phone] Fax: [phone]    [facility] - [address] - [place], [place] - [address]  Phone: [phone] Fax: [phone]        [name] NUMBER: [phone]  Best time to call back:   Cell phone:   Other phone:    Available times:    Patient's language of care: English    Patient does not need an interpreter.    Patient's PCP: Mitchell Heir, MD    Person calling on behalf of patient: Pharmacy calling want to double check instructions reg medication for azithromycin (ZITHROMAX) 250 MG tablet

## 2014-02-18 ENCOUNTER — Ambulatory Visit (HOSPITAL_BASED_OUTPATIENT_CLINIC_OR_DEPARTMENT_OTHER): Payer: PRIVATE HEALTH INSURANCE | Admitting: Podiatrist

## 2014-02-18 DIAGNOSIS — S9031XD Contusion of right foot, subsequent encounter: Secondary | ICD-10-CM

## 2014-02-18 DIAGNOSIS — G9050 Complex regional pain syndrome I, unspecified: Secondary | ICD-10-CM

## 2014-02-18 MED ORDER — DIAZEPAM 5 MG PO TABS
10.0000 mg | ORAL_TABLET | Freq: Every evening | ORAL | Status: DC | PRN
Start: 2014-02-18 — End: 2014-03-29

## 2014-02-18 MED ORDER — ZOLPIDEM TARTRATE 10 MG PO TABS
10.0000 mg | ORAL_TABLET | Freq: Every evening | ORAL | Status: DC | PRN
Start: 2014-02-18 — End: 2014-03-29

## 2014-02-18 MED ORDER — TRAMADOL HCL 50 MG PO TABS
50.0000 mg | ORAL_TABLET | Freq: Two times a day (BID) | ORAL | Status: DC | PRN
Start: 2014-02-18 — End: 2014-03-29

## 2014-02-18 NOTE — Progress Notes (Signed)
Subjective:  Ms. Emmaline Life is a 43 year old female who presents today for follow up of reflex sympathetic dystrophy of her right foot.  Patient reports she sustained a traumatic injury 4 years ago upon which she developed the RSD.  She states she has tried numerous therapies with minimal relief.  Upon last clinic visit on 8/12 an injection consisting of Exparel and dexamethasone was given to patient.  She reports relief of her pain for 4 weeks after the injection.  Patient does however report her pain has returned and is now an 8/10.  She presents today for another injection.  She denies any nausea, vomiting, fevers, chills, shortness of breath or chest pain.     ROS - See above HPI for pertinent findings.  All other systems were reviewed and are negative.    Objective: (Right foot exam)  GENERAL: Patient is alert, cooperative and pleasant.  She appears in no acute distress and is non-toxic appearing.  VASCULAR: Dorsalis pedis and posterior tibial pulses are 2/4 and palpable.  Capillary refill time is less than 3 seconds.  Temperature gradient from knees to toes is within normal limits.  No varicosities, edema, or erythema noted.  DERM: No open lesions.  No signs of infection present.    ORTHO: Pain on palpation along the entire right foot and extending proximally toward the tibial tuberosity.  Patient is able to actively dorsiflex, plantarflex, invert and evert her right foot.   NEURO: Diminished light touch sensation on the right.    Assessment and Plan:   Ms. Emmaline Life is a 43 year old female who presents today for follow up of chronic pain to her right foot secondary to reflex sympathetic dystrophy.  Patient responded well to treatment consisting of injection with Exparel.  The skin was first prepared using alcohol swab.  3 Injections consisting of 4.3mL of Exparel and 0.16mL of Dexamethasone were then injected into the right foot posterior tibial nerve, common peroneal nerve and an ankle block fashion.   Patient tolerated this very well.  She was given renewals of her prescriptions for Tramadol, Ambien and Valium.  She will follow up in clinic as needed.

## 2014-02-22 MED FILL — Bupivacaine Liposome Inj 1.3% (13.3 MG/ML): INTRAMUSCULAR | Qty: 20 | Status: CN

## 2014-02-24 MED ORDER — DEXAMETHASONE SODIUM PHOSPHATE 4 MG/ML IJ SOLN
2.00 mg | Freq: Once | INTRAMUSCULAR | Status: AC
Start: 2014-02-24 — End: 2014-02-24

## 2014-02-24 MED ORDER — BUPIVACAINE LIPOSOME 1.3 % IJ SUSP
1.00 mL | Freq: Once | INTRAMUSCULAR | Status: AC
Start: 2014-02-24 — End: 2014-02-24

## 2014-02-24 NOTE — Progress Notes (Signed)
Patient was seen with resident Dr. Anand.  I concur with their assessment and treatment plan.  I was present during their examination, and all procedures were performed under my direct supervision, or by me personally.

## 2014-03-29 ENCOUNTER — Ambulatory Visit (HOSPITAL_BASED_OUTPATIENT_CLINIC_OR_DEPARTMENT_OTHER): Payer: PRIVATE HEALTH INSURANCE | Admitting: Podiatrist

## 2014-03-29 ENCOUNTER — Encounter (HOSPITAL_BASED_OUTPATIENT_CLINIC_OR_DEPARTMENT_OTHER): Payer: Self-pay | Admitting: Podiatrist

## 2014-03-29 DIAGNOSIS — G9050 Complex regional pain syndrome I, unspecified: Secondary | ICD-10-CM

## 2014-03-29 MED ORDER — DIAZEPAM 5 MG PO TABS
10.0000 mg | ORAL_TABLET | Freq: Every evening | ORAL | Status: DC | PRN
Start: 2014-03-29 — End: 2014-05-08

## 2014-03-29 MED ORDER — ZOLPIDEM TARTRATE 10 MG PO TABS
10.0000 mg | ORAL_TABLET | Freq: Every evening | ORAL | Status: DC | PRN
Start: 2014-03-29 — End: 2014-05-08

## 2014-03-29 MED ORDER — TRAMADOL HCL 50 MG PO TABS
50.0000 mg | ORAL_TABLET | Freq: Two times a day (BID) | ORAL | Status: DC | PRN
Start: 2014-03-29 — End: 2014-05-08

## 2014-03-30 MED ORDER — DEXAMETHASONE SODIUM PHOSPHATE 4 MG/ML IJ SOLN
2.00 mg | Freq: Once | INTRAMUSCULAR | Status: AC
Start: 2014-03-30 — End: 2014-03-30

## 2014-03-30 NOTE — Progress Notes (Signed)
Date of Service: 03/29/2014    SUBJECTIVE:  The patient presents to the office today for chronic pain in her right foot and lower leg consistent with complex regional pain syndrome.  She has had tremendous relief since we have begun using Exparel with just a tiny bit of dexamethasone for her injections.  She reports that following her last injection she did get a temporary drop foot.    Today we have administered 3 regional blocks to her posterior tibial, to her common peroneal, and to her ankle.  She tolerated the 3 injections well without any complications noted.  She reports that following her last set of injections she got 5-1/2 weeks of relief.  Plan is for her to follow up with us on a p.r.n. basis once she begins to have pain again.    ___________________________  Reviewed and Electronically Signed By: Naoma DienerADAM S Kameah Rawl DPM  Sig Date: 03/30/2014  Sig Time: 11:41:53  Dictated By: Naoma DienerADAM S Jairo Bellew DPM  Dict Date: 03/30/2014 Dict Time: 09 30 AM    Dictation Date and Time:03/30/2014 09:30:55  Transcription Date and Time:03/30/2014 09:47:56  eScription Dictation id: [id number] Confirmation # :[id number]

## 2014-05-08 ENCOUNTER — Ambulatory Visit (HOSPITAL_BASED_OUTPATIENT_CLINIC_OR_DEPARTMENT_OTHER): Payer: PRIVATE HEALTH INSURANCE | Admitting: Podiatrist

## 2014-05-08 DIAGNOSIS — G9050 Complex regional pain syndrome I, unspecified: Secondary | ICD-10-CM

## 2014-05-08 MED ORDER — DIAZEPAM 5 MG PO TABS
10.0000 mg | ORAL_TABLET | Freq: Every evening | ORAL | Status: DC | PRN
Start: 2014-05-08 — End: 2014-06-26

## 2014-05-08 MED ORDER — ZOLPIDEM TARTRATE 10 MG PO TABS
10.0000 mg | ORAL_TABLET | Freq: Every evening | ORAL | Status: DC | PRN
Start: 2014-05-08 — End: 2014-06-26

## 2014-05-08 MED ORDER — TRAMADOL HCL 50 MG PO TABS
50.0000 mg | ORAL_TABLET | Freq: Two times a day (BID) | ORAL | Status: DC | PRN
Start: 2014-05-08 — End: 2014-08-12

## 2014-06-07 MED ORDER — BUPIVACAINE LIPOSOME 1.3 % IJ SUSP
1.00 mL | Freq: Once | INTRAMUSCULAR | Status: AC
Start: 2014-06-07 — End: 2014-06-07

## 2014-06-07 MED ORDER — DEXAMETHASONE SODIUM PHOSPHATE 4 MG/ML IJ SOLN
2.00 mg | Freq: Once | INTRAMUSCULAR | Status: AC
Start: 2014-06-07 — End: 2014-06-07

## 2014-06-07 NOTE — Progress Notes (Signed)
SUBJECTIVE: The patient presents to the office today for chronic pain in her right foot and lower leg consistent with complex regional pain syndrome. She has had tremendous relief since we have begun using Exparel with just a tiny bit of dexamethasone for her injections. She reports that following her last injection she did get a temporary drop foot.    Today we have administered 3 regional blocks to her posterior tibial, to her common peroneal, and to her ankle with 5cc of exaparel and 0.5 cc of dexamethasone to each site. She tolerated the 3 injections well without any complications noted. She reports that following her last set of injections she got 5-1/2 weeks of relief. Plan is for her to follow up with [place] on a p.r.n. basis once she begins to have pain again.

## 2014-06-26 ENCOUNTER — Ambulatory Visit (HOSPITAL_BASED_OUTPATIENT_CLINIC_OR_DEPARTMENT_OTHER): Payer: PRIVATE HEALTH INSURANCE | Admitting: Podiatrist

## 2014-06-26 VITALS — Wt 126.0 lb

## 2014-06-26 DIAGNOSIS — G9050 Complex regional pain syndrome I, unspecified: Secondary | ICD-10-CM

## 2014-06-26 DIAGNOSIS — M79671 Pain in right foot: Secondary | ICD-10-CM

## 2014-06-26 MED ORDER — DIAZEPAM 5 MG PO TABS
10.0000 mg | ORAL_TABLET | Freq: Every evening | ORAL | Status: DC | PRN
Start: 2014-06-26 — End: 2014-08-12

## 2014-06-26 MED ORDER — ZOLPIDEM TARTRATE 10 MG PO TABS
10.0000 mg | ORAL_TABLET | Freq: Every evening | ORAL | Status: DC | PRN
Start: 2014-06-26 — End: 2014-08-12

## 2014-06-26 NOTE — Progress Notes (Signed)
Date of Service: 06/26/2014    SUBJECTIVE:  The patient presents to the office today for followup for reflex sympathetic dystrophy and chronic pain in her right foot and leg.  Today, she presents with toes that are blue on 1, 2, 3, and 4.  She also reports that the pain has been markedly worse than the prior few weeks.  Today we did a posterior tibial, common peroneal, and an ankle block all on the right side with Exparel with 0.25 mL of dexamethasone 4 mg/mL added to each injection site.  She tolerated the 3 nerve blocks well without any complications noted.  There was some rebound and color of her toes just from that short period of time.  Plan is for her to follow up with us on a p.r.n. basis.  She did note significant pain improvement almost immediately.    ___________________________  Reviewed and Electronically Signed By: Naoma DienerADAM S Dwane Andres DPM  Sig Date: 06/27/2014  Sig Time: 12:23:01  Dictated By: Naoma DienerADAM S Dusty Raczkowski DPM  Dict Date: 06/26/2014 Dict Time: 05 20 PM    Dictation Date and Time:06/26/2014 17:20:53  Transcription Date and Time:06/26/2014 19:03:01  eScription Dictation id: [id number] Confirmation # :[id number]

## 2014-07-22 MED ORDER — DEXAMETHASONE SODIUM PHOSPHATE 4 MG/ML IJ SOLN
2.00 mg | Freq: Once | INTRAMUSCULAR | Status: AC
Start: 2014-07-22 — End: 2014-07-22

## 2014-08-12 ENCOUNTER — Ambulatory Visit (HOSPITAL_BASED_OUTPATIENT_CLINIC_OR_DEPARTMENT_OTHER): Payer: No Typology Code available for payment source | Admitting: Podiatrist

## 2014-08-12 DIAGNOSIS — M79671 Pain in right foot: Secondary | ICD-10-CM

## 2014-08-12 DIAGNOSIS — G9050 Complex regional pain syndrome I, unspecified: Secondary | ICD-10-CM

## 2014-08-12 MED ORDER — DIAZEPAM 5 MG PO TABS
10.0000 mg | ORAL_TABLET | Freq: Every evening | ORAL | Status: DC | PRN
Start: 2014-08-12 — End: 2014-10-02

## 2014-08-12 MED ORDER — TRAMADOL HCL 50 MG PO TABS
50.0000 mg | ORAL_TABLET | Freq: Two times a day (BID) | ORAL | Status: DC | PRN
Start: 2014-08-12 — End: 2014-10-02

## 2014-08-12 MED ORDER — ZOLPIDEM TARTRATE 10 MG PO TABS
10.0000 mg | ORAL_TABLET | Freq: Every evening | ORAL | Status: DC | PRN
Start: 2014-08-12 — End: 2014-10-02

## 2014-09-11 ENCOUNTER — Ambulatory Visit (HOSPITAL_BASED_OUTPATIENT_CLINIC_OR_DEPARTMENT_OTHER): Payer: No Typology Code available for payment source | Admitting: Internal Medicine

## 2014-10-02 ENCOUNTER — Ambulatory Visit (HOSPITAL_BASED_OUTPATIENT_CLINIC_OR_DEPARTMENT_OTHER): Payer: No Typology Code available for payment source | Admitting: Podiatrist

## 2014-10-02 ENCOUNTER — Encounter (HOSPITAL_BASED_OUTPATIENT_CLINIC_OR_DEPARTMENT_OTHER): Payer: Self-pay | Admitting: Podiatrist

## 2014-10-02 DIAGNOSIS — G9050 Complex regional pain syndrome I, unspecified: Secondary | ICD-10-CM

## 2014-10-02 MED ORDER — TRAMADOL HCL 50 MG PO TABS
50.0000 mg | ORAL_TABLET | Freq: Two times a day (BID) | ORAL | Status: DC | PRN
Start: 2014-10-02 — End: 2014-10-23

## 2014-10-02 MED ORDER — DIAZEPAM 5 MG PO TABS
10.0000 mg | ORAL_TABLET | Freq: Every evening | ORAL | Status: DC | PRN
Start: 2014-10-02 — End: 2014-10-23

## 2014-10-02 MED ORDER — ZOLPIDEM TARTRATE 10 MG PO TABS
10.0000 mg | ORAL_TABLET | Freq: Every evening | ORAL | Status: DC | PRN
Start: 2014-10-02 — End: 2014-10-23

## 2014-10-06 MED ORDER — BUPIVACAINE LIPOSOME 1.3 % IJ SUSP
1.0000 mL | Freq: Once | INTRAMUSCULAR | Status: DC
Start: 2014-10-06 — End: 2017-09-27

## 2014-10-06 MED ORDER — DEXAMETHASONE SODIUM PHOSPHATE 4 MG/ML IJ SOLN
2.00 mg | Freq: Once | INTRAMUSCULAR | Status: AC
Start: 2014-10-06 — End: 2014-10-06

## 2014-10-06 NOTE — Progress Notes (Signed)
This office note has been dictated. Account number [id number]

## 2014-10-06 NOTE — Progress Notes (Signed)
Date of Service: 08/12/2014    This is an established patient who presents to the office on August 12, 2014, with a chief concern of chronic and severe pain in her right foot and lower leg.  She has been treated by us for reflex sympathetic dystrophy and presents today with pain, which has reached a level of 8 or 9/10.  She denies fever, chills, nausea, vomiting, night sweats, shortness of breath, and calf pain.  She has no new contusions to report.  She was last seen by us on January 20th, 2016, for the same problem.  At that time, we gave her an injection of Exparel to common peroneal, posterior tibial, and ankle block, and she got approximately 6 weeks of relief out of this.    PLAN:  I have recommended that we repeat this treatment today, Exparel 5 mL along with 0.25 mL of dexamethasone 4 mg/mL was combined into one 5 mL syringe and 3 separate injections were given, 1 to the common peroneal nerve on the right lower leg, 1 to the posterior tibial nerve on the right side, and one to the ankle of the right side.  She tolerated all 3 injections well without any complications noted.  She notes immediate relief.  I did warn her about the possibility of drop foot associated with the common peroneal injection.  She indicates she understands and will follow up with us on a p.r.n. basis.    ___________________________  Reviewed and Electronically Signed By: Naoma DienerADAM S Lacole Komorowski DPM  Sig Date: 10/07/2014  Sig Time: 15:07:33  Dictated By: Naoma DienerADAM S Elih Mooney DPM  Dict Date: 10/06/2014 Dict Time: 10 48 PM    Dictation Date and Time:10/06/2014 22:48:38  Transcription Date and Time:10/06/2014 23:55:05  eScription Dictation id: [id number] Confirmation # :[id number]

## 2014-10-23 ENCOUNTER — Ambulatory Visit (HOSPITAL_BASED_OUTPATIENT_CLINIC_OR_DEPARTMENT_OTHER): Payer: PRIVATE HEALTH INSURANCE | Admitting: Podiatrist

## 2014-10-23 DIAGNOSIS — S9031XA Contusion of right foot, initial encounter: Secondary | ICD-10-CM

## 2014-10-23 DIAGNOSIS — G9050 Complex regional pain syndrome I, unspecified: Secondary | ICD-10-CM

## 2014-10-23 DIAGNOSIS — M79671 Pain in right foot: Secondary | ICD-10-CM

## 2014-10-23 LAB — XR FOOT RIGHT MINIMUM 3 VIEWS

## 2014-10-23 MED ORDER — DIAZEPAM 5 MG PO TABS
10.0000 mg | ORAL_TABLET | Freq: Two times a day (BID) | ORAL | 0 refills | Status: DC | PRN
Start: 2014-10-23 — End: 2014-12-13

## 2014-10-23 MED ORDER — DIAZEPAM 5 MG PO TABS
10.0000 mg | ORAL_TABLET | Freq: Two times a day (BID) | ORAL | 0 refills | Status: DC | PRN
Start: 2014-10-23 — End: 2014-10-23

## 2014-10-23 MED ORDER — TRAMADOL HCL 50 MG PO TABS
50.0000 mg | ORAL_TABLET | Freq: Two times a day (BID) | ORAL | 0 refills | Status: DC | PRN
Start: 2014-10-23 — End: 2015-01-08

## 2014-10-23 MED ORDER — TRAMADOL HCL 50 MG PO TABS
50.0000 mg | ORAL_TABLET | Freq: Two times a day (BID) | ORAL | 0 refills | Status: DC | PRN
Start: 2014-10-23 — End: 2014-10-23

## 2014-10-23 MED ORDER — ZOLPIDEM TARTRATE 10 MG PO TABS
10.0000 mg | ORAL_TABLET | Freq: Every evening | ORAL | 0 refills | Status: DC | PRN
Start: 2014-10-23 — End: 2014-10-23

## 2014-10-23 MED ORDER — LIDOCAINE 5 % EX PTCH
1.0000 | MEDICATED_PATCH | CUTANEOUS | 4 refills | Status: AC
Start: 2014-10-23 — End: 2016-03-31

## 2014-10-23 MED ORDER — ACETAMINOPHEN-CODEINE #3 300-30 MG PO TABS
1.0000 | ORAL_TABLET | Freq: Two times a day (BID) | ORAL | 0 refills | Status: DC | PRN
Start: 2014-10-23 — End: 2014-12-13

## 2014-10-23 MED ORDER — ZOLPIDEM TARTRATE 10 MG PO TABS
10.0000 mg | ORAL_TABLET | Freq: Every evening | ORAL | 0 refills | Status: DC | PRN
Start: 2014-10-23 — End: 2014-12-13

## 2014-10-24 MED ORDER — DEXAMETHASONE SODIUM PHOSPHATE 4 MG/ML IJ SOLN
2.00 mg | Freq: Once | INTRAMUSCULAR | 0 refills | Status: AC
Start: 2014-10-24 — End: 2014-10-24

## 2014-10-24 MED ORDER — BUPIVACAINE LIPOSOME 1.3 % IJ SUSP
1.00 mL | Freq: Once | INTRAMUSCULAR | 0 refills | Status: AC
Start: 2014-10-24 — End: 2014-10-24

## 2014-10-24 NOTE — Progress Notes (Signed)
Subjective:  CC: Right foot and leg pain  HPI: 44 year old female presents to clinic complaining of right foot and leg pain. This began following a crush injury, after which she subsequently developed RSD. She has been receiving monthly injections of Exparel with dexamethasone. She states he symptoms have not changed since her last visit. Continues to lose weight, does walking and yoga for exercise.     She also has a new concern of pain across the dorsum of the right foot, near the base of the toes, where she recently dropped something on the foot.  There is mild echymosis, and pain with palpation beyond standard symptoms.    ROS: Denies N/V/F/C/SOB/CP.    Objective:  GENERAL: Patient is pleasant and in NAD.  DERM: Skin is cool, dry, supple, BLE. Nails 1-5 well maintained, BLE. Hair present to the level of the digits. No open lesions, no SOI.  VASC: DP/PT palpable 2/4, bil. CFT < 3 sec, bil.  MSK: Strength to LE muscle groups 5/5, bil.  NEURO: Epicritic sensation intact via light touch, bil.    Assessment/Plan:  - 44 year old female presents with RSD of right leg and foot.  - 3 injections of 4.75 mL of Exparel and 0.25 mL of Dexamethasone to Posterior Tibial nerve, Common Peroneal nerve, and ring block around the ankle.  -Xray ordered of the right foot.  Minimal bony resorption is noted, and there is no evidence of fracture.  Pt. Will return to stiff soled shoe if pain is not resolved with injection.  - Patient will RTC prn.

## 2014-11-05 MED ORDER — BUPIVACAINE LIPOSOME 1.3 % IJ SUSP
1.00 mL | Freq: Once | INTRAMUSCULAR | 0 refills | Status: AC
Start: 2014-11-05 — End: 2014-11-05

## 2014-11-05 MED ORDER — DEXAMETHASONE SODIUM PHOSPHATE 4 MG/ML IJ SOLN
2.00 mg | Freq: Once | INTRAMUSCULAR | 0 refills | Status: AC
Start: 2014-11-05 — End: 2014-11-05

## 2014-11-05 NOTE — Progress Notes (Signed)
The patient presents to the office today for chronic pain in her right foot and lower leg consistent with complex regional pain syndrome. She has had tremendous relief since we have begun using Exparel with just a tiny bit of dexamethasone for her injections. She reports that following her last injection she did not get a temporary drop foot.    Today we have administered 3 regional blocks to her posterior tibial, to her common peroneal, and to her ankle with 5cc of Exparel and 0.5 cc of dexamethasone to each site. She tolerated the 3 injections well without any complications noted. She reports that following her last set of injections she got about 6 weeks of relief. Plan is for her to follow up with us on a p.r.n. basis once she begins to have pain again.

## 2014-12-13 ENCOUNTER — Ambulatory Visit (HOSPITAL_BASED_OUTPATIENT_CLINIC_OR_DEPARTMENT_OTHER): Payer: PRIVATE HEALTH INSURANCE | Admitting: Podiatrist

## 2014-12-13 DIAGNOSIS — G9050 Complex regional pain syndrome I, unspecified: Secondary | ICD-10-CM

## 2014-12-13 MED ORDER — ACETAMINOPHEN-CODEINE #3 300-30 MG PO TABS
1.0000 | ORAL_TABLET | Freq: Two times a day (BID) | ORAL | 0 refills | Status: AC | PRN
Start: 2014-12-13 — End: 2014-12-20

## 2014-12-13 MED ORDER — KETOROLAC TROMETHAMINE 10 MG PO TABS
10.0000 mg | ORAL_TABLET | Freq: Four times a day (QID) | ORAL | 0 refills | Status: DC | PRN
Start: 2014-12-13 — End: 2015-04-14

## 2014-12-13 MED ORDER — DIAZEPAM 5 MG PO TABS
10.0000 mg | ORAL_TABLET | Freq: Two times a day (BID) | ORAL | 0 refills | Status: DC | PRN
Start: 2014-12-13 — End: 2015-01-08

## 2014-12-13 MED ORDER — ZOLPIDEM TARTRATE 10 MG PO TABS
10.0000 mg | ORAL_TABLET | Freq: Every evening | ORAL | 0 refills | Status: DC | PRN
Start: 2014-12-13 — End: 2015-01-08

## 2014-12-13 NOTE — Progress Notes (Signed)
Date of Service: 12/13/2014    SUBJECTIVE:  The patient presents to the office today on an urgent basis with a chief concern of severe pain in her entire right foot and lower leg.  Clinical examination today reveals erythema and edema in her right foot, and significantly more pain than she has had in the last few visits.  There is also a relative coolness of the foot as compared to the contralateral limb consistent with reactivation of her reflex sympathetic dystrophy.    PLAN:  Today, I have recommended and performed nerve conduction blocks to the posterior tibial, common peroneal, and also an ankle block utilizing EXPAREL approximately 5 mL per site along with 0.25 mL of dexamethasone 4 mg/mL.  She tolerated the injections well without any complications noted.  I have also renewed her prescriptions for tramadol, Tylenol #3, and diazepam, and we will have her follow up with us on a p.r.n. basis.    ___________________________  Reviewed and Electronically Signed By: Naoma DienerADAM S Saliha Salts DPM  Sig Date: 12/14/2014  Sig Time: 13:37:47  Dictated By: Naoma DienerADAM S Elverna Caffee DPM  Dict Date: 12/13/2014 Dict Time: 05 36 PM    Dictation Date and Time:12/13/2014 17:36:20  Transcription Date and Time:12/13/2014 17:48:16  eScription Dictation id: [id number] Confirmation # :[id number]

## 2014-12-15 MED ORDER — DEXAMETHASONE SODIUM PHOSPHATE 4 MG/ML IJ SOLN
2.00 mg | Freq: Once | INTRAMUSCULAR | 0 refills | Status: AC
Start: 2014-12-15 — End: 2014-12-15

## 2014-12-15 MED ORDER — BUPIVACAINE LIPOSOME 1.3 % IJ SUSP
1.00 mL | Freq: Once | INTRAMUSCULAR | 0 refills | Status: AC
Start: 2014-12-15 — End: 2014-12-15

## 2014-12-17 ENCOUNTER — Telehealth (HOSPITAL_BASED_OUTPATIENT_CLINIC_OR_DEPARTMENT_OTHER): Payer: Self-pay | Admitting: Foot & Ankle Surgery

## 2014-12-17 NOTE — Progress Notes (Signed)
Called pharmacy back to inform them Exparel prescription was in error

## 2014-12-17 NOTE — Telephone Encounter (Signed)
-----   Message from Allyne GeeAgnelo Lopes sent at 12/17/2014 10:44 AM EDT -----  Regarding: To confirm script Dr Glendon AxeLandsman       Madison Mckay [id number], 44 year old, female, Telephone Information:  Home Phone      [phone]  Work Phone      Not on file.  Mobile          [phone]      Patient's Preferred Pharmacy:     [facility] - [address] - [place], [place] - [address]  Phone: [phone] Fax: [phone]    [facility] - [address] - [place], [place] - [address]  Phone: [phone] Fax: [phone]          Cleotis LemaCALL BACK NUMBER: [phone]  Best time to call back: today          Patient's language of care: English    Patient does not need an interpreter.    Patient's PCP: Mitchell Heirassie Frank, MD    Person calling on behalf of patient: Pharmacy [facility]    Calls today to confirm with Dr Rosilyn MingsLandsman a script for Exparel was sent to [facility] a couple of days ago, pharmacy wanted to know if this script was sent to the pharmacy by accident?.[name]

## 2015-01-08 ENCOUNTER — Encounter (HOSPITAL_BASED_OUTPATIENT_CLINIC_OR_DEPARTMENT_OTHER): Payer: Self-pay | Admitting: Podiatrist

## 2015-01-08 ENCOUNTER — Ambulatory Visit (HOSPITAL_BASED_OUTPATIENT_CLINIC_OR_DEPARTMENT_OTHER): Payer: PRIVATE HEALTH INSURANCE | Admitting: Podiatrist

## 2015-01-08 DIAGNOSIS — M79609 Pain in unspecified limb: Secondary | ICD-10-CM

## 2015-01-08 DIAGNOSIS — G9050 Complex regional pain syndrome I, unspecified: Secondary | ICD-10-CM

## 2015-01-08 DIAGNOSIS — G8929 Other chronic pain: Secondary | ICD-10-CM

## 2015-01-08 DIAGNOSIS — M79671 Pain in right foot: Secondary | ICD-10-CM

## 2015-01-08 MED ORDER — TRAMADOL HCL 50 MG PO TABS
50.0000 mg | ORAL_TABLET | Freq: Two times a day (BID) | ORAL | 0 refills | Status: DC | PRN
Start: 2015-01-08 — End: 2015-01-08

## 2015-01-08 MED ORDER — ZOLPIDEM TARTRATE 10 MG PO TABS
10.0000 mg | ORAL_TABLET | Freq: Every evening | ORAL | 0 refills | Status: DC | PRN
Start: 2015-01-08 — End: 2015-01-08

## 2015-01-08 MED ORDER — DIAZEPAM 5 MG PO TABS
10.0000 mg | ORAL_TABLET | Freq: Two times a day (BID) | ORAL | 0 refills | Status: DC | PRN
Start: 2015-01-08 — End: 2015-01-08

## 2015-01-08 MED ORDER — ALPHA-LIPOIC ACID 600 MG PO CAPS
600.0000 mg | ORAL_CAPSULE | Freq: Two times a day (BID) | ORAL | 3 refills | Status: DC
Start: 2015-01-08 — End: 2015-01-08

## 2015-01-27 MED ORDER — DEXAMETHASONE SODIUM PHOSPHATE 4 MG/ML IJ SOLN
2.00 mg | Freq: Once | INTRAMUSCULAR | 0 refills | Status: AC
Start: 2015-01-27 — End: 2015-01-27

## 2015-01-27 MED ORDER — BUPIVACAINE LIPOSOME 1.3 % IJ SUSP
1.00 mL | Freq: Once | INTRAMUSCULAR | 0 refills | Status: AC
Start: 2015-01-27 — End: 2015-01-27

## 2015-01-27 NOTE — Progress Notes (Signed)
SUBJECTIVE: The patient presents to the office today with a chief concern of severe pain in her entire right foot and lower leg. Clinical examination today reveals erythema and edema in her right foot, and significantly more pain than she has had in the last few visits. There is also a relative coolness of the foot as compared to the contralateral limb consistent with reactivation of her reflex sympathetic dystrophy.    PLAN: Today, I have recommended and performed nerve conduction blocks to the posterior tibial, common peroneal, and also an ankle block utilizing EXPAREL approximately 5 mL per site along with 0.25 mL of dexamethasone 4 mg/mL. She tolerated the injections well without any complications noted. I have also renewed her prescriptions for tramadol, Tylenol #3, and diazepam, and we will have her follow up with [place] on a p.r.n. basis.

## 2015-02-19 ENCOUNTER — Encounter (HOSPITAL_BASED_OUTPATIENT_CLINIC_OR_DEPARTMENT_OTHER): Payer: Self-pay | Admitting: Podiatrist

## 2015-02-19 ENCOUNTER — Ambulatory Visit (HOSPITAL_BASED_OUTPATIENT_CLINIC_OR_DEPARTMENT_OTHER): Payer: PRIVATE HEALTH INSURANCE | Admitting: Podiatrist

## 2015-02-19 VITALS — BP 107/76 | HR 66

## 2015-02-19 DIAGNOSIS — G9050 Complex regional pain syndrome I, unspecified: Secondary | ICD-10-CM

## 2015-02-19 MED ORDER — TRAMADOL HCL 50 MG PO TABS
50.0000 mg | ORAL_TABLET | Freq: Two times a day (BID) | ORAL | 0 refills | Status: AC | PRN
Start: 2015-02-19 — End: 2015-03-11

## 2015-02-19 MED ORDER — DIAZEPAM 5 MG PO TABS
10.0000 mg | ORAL_TABLET | Freq: Two times a day (BID) | ORAL | 0 refills | Status: DC | PRN
Start: 2015-02-19 — End: 2015-04-14

## 2015-02-19 MED ORDER — ZOLPIDEM TARTRATE 10 MG PO TABS
10.0000 mg | ORAL_TABLET | Freq: Every evening | ORAL | 0 refills | Status: DC | PRN
Start: 2015-02-19 — End: 2015-04-14

## 2015-02-24 MED ORDER — BUPIVACAINE LIPOSOME 1.3 % IJ SUSP
1.00 mL | Freq: Once | INTRAMUSCULAR | 0 refills | Status: AC
Start: 2015-02-24 — End: 2015-02-24

## 2015-02-24 MED ORDER — DEXAMETHASONE SODIUM PHOSPHATE 4 MG/ML IJ SOLN
2.00 mg | Freq: Once | INTRAMUSCULAR | 0 refills | Status: AC
Start: 2015-02-24 — End: 2015-02-24

## 2015-02-24 NOTE — Progress Notes (Signed)
SUBJECTIVE: The patient presents to the office today with a chief concern of severe pain in her entire right foot and lower leg. Clinical examination today reveals erythema and edema in her right foot. There is also a relative coolness of the foot as compared to the contralateral limb consistent with reactivation of her reflex sympathetic dystrophy.    PLAN: Today, I have recommended and performed nerve conduction blocks to the posterior tibial, common peroneal, and also an ankle block utilizing EXPAREL approximately 5 mL per site along with 0.25 mL of dexamethasone 4 mg/mL. She tolerated the injections well without any complications noted, and we will have her follow up with [place] on a p.r.n. basis.

## 2015-03-24 ENCOUNTER — Other Ambulatory Visit (HOSPITAL_BASED_OUTPATIENT_CLINIC_OR_DEPARTMENT_OTHER): Payer: Self-pay | Admitting: Podiatrist

## 2015-03-24 MED ORDER — AZITHROMYCIN 250 MG PO TABS
250.00 mg | ORAL_TABLET | Freq: Every day | ORAL | 0 refills | Status: AC
Start: 2015-03-24 — End: 2015-03-29

## 2015-03-28 ENCOUNTER — Ambulatory Visit (HOSPITAL_BASED_OUTPATIENT_CLINIC_OR_DEPARTMENT_OTHER): Payer: Self-pay

## 2015-03-28 ENCOUNTER — Encounter (HOSPITAL_BASED_OUTPATIENT_CLINIC_OR_DEPARTMENT_OTHER): Payer: Self-pay

## 2015-03-28 ENCOUNTER — Emergency Department (HOSPITAL_BASED_OUTPATIENT_CLINIC_OR_DEPARTMENT_OTHER)
Admission: RE | Admit: 2015-03-28 | Disposition: A | Discharge status: Home / Self Care | Payer: Self-pay | Source: Emergency Department | Attending: Student in an Organized Health Care Education/Training Program | Admitting: Student in an Organized Health Care Education/Training Program

## 2015-03-28 LAB — CBC, PLATELET & DIFFERENTIAL
ABSOLUTE BASO COUNT: 0 10*3/uL (ref 0.0–0.1)
ABSOLUTE EOSINOPHIL COUNT: 0.1 10*3/uL (ref 0.0–0.8)
ABSOLUTE IMM GRAN COUNT: 0.02 10*3/uL (ref 0.00–0.03)
ABSOLUTE LYMPH COUNT: 1.5 10*3/uL (ref 0.6–5.9)
ABSOLUTE MONO COUNT: 0.6 10*3/uL (ref 0.2–1.4)
ABSOLUTE NEUTROPHIL COUNT: 4.8 10*3/uL (ref 1.6–8.3)
BASOPHIL %: 0.4 % (ref 0.0–1.2)
EOSINOPHIL %: 1 % (ref 0.0–7.0)
HEMATOCRIT: 40.2 % (ref 34.1–44.9)
HEMOGLOBIN: 13.9 g/dL (ref 11.2–15.7)
IMMATURE GRANULOCYTE %: 0.3 % (ref 0.0–0.4)
LYMPHOCYTE %: 21.2 % (ref 15.0–54.0)
MEAN CORP HGB CONC: 34.6 g/dL (ref 31.0–37.0)
MEAN CORPUSCULAR HGB: 30.5 pg (ref 26.0–34.0)
MEAN CORPUSCULAR VOL: 88.4 fL (ref 80.0–100.0)
MEAN PLATELET VOLUME: 11.4 fL (ref 8.7–12.5)
MONOCYTE %: 8.6 % (ref 4.0–13.0)
NEUTROPHIL %: 68.5 % (ref 40.0–75.0)
PLATELET COUNT: 214 10*3/uL (ref 150–400)
RBC DISTRIBUTION WIDTH STD DEV: 40.9 fL (ref 35.1–46.3)
RBC DISTRIBUTION WIDTH: 12.8 % (ref 11.5–14.3)
RED BLOOD CELL COUNT: 4.55 M/uL (ref 3.90–5.20)
WHITE BLOOD CELL COUNT: 6.9 10*3/uL (ref 4.0–11.0)

## 2015-03-28 LAB — COMPREHENSIVE METABOLIC PANEL
ALANINE AMINOTRANSFERASE: 23 U/L (ref 12–45)
ALBUMIN: 4.3 g/dL (ref 3.4–5.0)
ALKALINE PHOSPHATASE: 59 U/L (ref 45–117)
ANION GAP: 9 mmol/L (ref 5–15)
ASPARTATE AMINOTRANSFERASE: 12 U/L (ref 8–34)
BILIRUBIN TOTAL: 1.1 mg/dL — ABNORMAL HIGH (ref 0.2–1.0)
BUN (UREA NITROGEN): 12 mg/dL (ref 7–18)
CALCIUM: 9.3 mg/dL (ref 8.5–10.1)
CARBON DIOXIDE: 32 mmol/L (ref 21–32)
CHLORIDE: 101 mmol/L (ref 98–107)
CREATININE: 0.8 mg/dL (ref 0.4–1.2)
ESTIMATED GLOMERULAR FILT RATE: >60 mL/min (ref >60)
Glucose Random: 98 mg/dL (ref 74–160)
POTASSIUM: 3.6 mmol/L (ref 3.5–5.1)
SODIUM: 142 mmol/L (ref 136–145)
TOTAL PROTEIN: 7.1 g/dL (ref 6.4–8.2)

## 2015-03-28 LAB — POC URINALYSIS
GLUCOSE,URINE: NEGATIVE
KETONE, URINE: 15 — AB
LEUKOCYTE ESTERASE: NEGATIVE
NITRITE, URINE: NEGATIVE
OCCULT BLOOD, URINE: NEGATIVE
PH URINE: 7.5 (ref 5.0–8.0)
PROTEIN, URINE: 30 — AB
SPECIFIC GRAVITY, URINE: 1.015 (ref 1.003–1.030)
UROBILINOGEN URINE: 0.2 (ref 0.2–1.0)

## 2015-03-28 LAB — MAGNESIUM: MAGNESIUM: 2.1 mg/dL (ref 1.8–2.4)

## 2015-03-28 LAB — URINE PREGNANCY TEST (POINT OF CARE): HCG QUALITATIVE URINE: NEGATIVE

## 2015-03-28 MED ORDER — SODIUM CHLORIDE 0.9 % IV BOLUS
1000.0000 mL | Freq: Once | INTRAVENOUS | Status: AC
  Administered 2015-03-28: 1000 mL via INTRAVENOUS

## 2015-03-28 NOTE — Narrator Note (Signed)
Patient Disposition    Patient education for diagnosis, medications, activity, diet and follow-up.  Patient left ED 10:35 AM.  Patient rep received written instructions.  Interpreter to provide instructions: Yes    Patient belongings with patient: YES    Have all existing LDAs been addressed? Yes    Have all IV infusions been stopped? Yes    Discharged to: Discharged to home

## 2015-03-28 NOTE — Discharge Instructions (Signed)
What we did in the Emergency Department (ED):  - Exam and History   -EKG, blood work and urinalysis  -you were given IV fluid    Next steps:  - Follow up with your regular doctor. Call tomorrow with update and arrange follow up visit.   - - Stay well hydrated     Come back to the Emergency Department (ED) for:  Return to the emergency department with any worsening, any concerning symptoms or if unable to obtain follow-up. Worsening signs and symptoms include but are not limited to fainting, chest pain, trouble breathing, lightheadedness, vomiting, confusion, seizure or any other new or concerning symptoms    Thank you for your patience.

## 2015-03-28 NOTE — ED Provider Notes (Signed)
I have reviewed the ED nursing notes and prior records. I have reviewed the patient's past medical history/problem list, allergies, social history and medication list.  I saw this patient with the PA.  Please see additional PA ED provider note for further details.    HPI:  This 44 year old female patient presents with chief complaint of syncopal event prior to arrival.  States that she drinks lots of juices and to drink juice yesterday did not have anything to eat or drink this morning.  Prior to arrival she was on the tea with a friend and she started to feel lightheaded and faint with a feeling of nausea when the friend noted that she then lost consciousness and did not have a significant head strike.  Patient was unconscious for maybe 30-45 seconds and then woke up and was alert and oriented to where she was.  She states that she currently just feels a little bit out of it and has some mild nausea.  Not have any chest pain or palpitations or shortness of breath on the episode.  She has no fever or cough.    ROS: Pertinent positives were reviewed as per the HPI above. All other systems were reviewed and are negative.    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     Comment: crush injury right foot    Rosacea     Shingles      Patient Active Problem List:     Asthma     Contusion, foot     Neuritis     RSD (reflex sympathetic dystrophy)     Hypertension     Hypothyroidism     Right shoulder pain     Depression     HSV-1 (herpes simplex virus 1) infection     Raynaud's syndrome     Foot pain      Past Surgical History:    Past Surgical History    TOTAL ABDOMINAL  HYSTERECT W/WO RMVL TUBE OVARY      Comment just TAH no ovary removal, 2005; laparascopies x 2 before TAH for endometriosis    ACL REPAIR      Comment left 2004, cadavaric acl    OB ANTEPARTUM CARE CESAREAN DLVR & POSTPARTUM      Comment x2, 97 and 98    ROTATOR CUFF REPAIR      Comment Labral repair anchor not sure if plastic or metal but had MRI with this in place without problems, on right shoulder       Medications:     Current Facility-Administered Medications:  sodium chloride 0.9 % IV bolus 1,000 mL 1,000 mL Intravenous Once [employer] V[employer]ole[employer] S Hodgens, PA   bupivacaine liposomal (EXPAREL) injection 1-20 mL 1-20 mL Infiltration Once Adam Landsman   bupivacaine liposomal (EXPAREL) injection 1-20 mL 1-20 mL Infiltration Once [facility] LabsAdam Landsman     Current Outpatient Prescriptions:  azithromycin (ZITHROMAX) 250 MG tablet Take 1 tablet by mouth daily Take two pills the first day, and one per day for the  next 4 days. Disp: 6 tablet Rfl: 0   amLODIPine (NORVASC) 10 MG tablet Take 10 mg by mouth daily Disp:  Rfl:    SYNTHROID 125 MCG tablet Take 1 tablet by mouth every morning. Disp: 90 tablet Rfl: 0   hydrochlorothiazide (HYDRODIURIL) 25 MG tablet Take 1 tablet by mouth daily. Disp: 90 tablet Rfl: 4   ketorolac (TORADOL) 10 MG tablet Take 1 tablet by mouth every 6 (six) hours as needed for Pain Disp: 40 tablet Rfl: 0   lidocaine (LIDODERM) 5 % patch Place 1 patch onto the skin daily for 335 days Disp: 30 patch Rfl: 4   naproxen (NAPROSYN) 375 MG tablet Take 1 tablet by mouth 2 (two) times daily with meals. Disp: 28 tablet Rfl: 1   Diclofenac Sodium 1 % GEL Gel Apply  topically. Do Not Exceed 32 g in 24 Hours Disp:  Rfl:    Tapentadol HCl (NUCYNTA) 150 MG TB12 Take 1 tablet by mouth 4 (four) times daily. Disp: 84 tablet Rfl: 0   albuterol (PROAIR HFA) 108 (90 BASE) MCG/ACT inhaler Inhale 2 puffs into the lungs every 6 (six) hours as needed for Wheezing. Disp: 1 Inhaler Rfl: 11   fluticasone-salmeterol (ADVAIR DISKUS) 500-50  MCG/DOSE AEPB Aerosol Powder Inhale 1 puff into the lungs every 12 (twelve) hours. Disp: 180 each Rfl: 0   EPINEPHrine (EPIPEN 2-PAK) 0.3 MG/0.3ML Injection Device Inject 0.3 mg into the muscle once as needed. Disp: 1 each Rfl: 1   clindamycin-benzoyl peroxide (BENZACLIN) gel Apply  topically 2 (two) times daily. Disp: 50 g Rfl: 4       Social History:     Smoking status: Never Smoker    Smokeless tobacco: Never Used    Alcohol use No       Allergies:  Review of Patient's Allergies indicates:   Oxycodone                   Comment:Hives, other narcotics OK   Penicillins             Hives   Erythromycin            Nausea Only    Comment:Stomach pain severe no nausea, can take             azithromycin   Fluoxetine              Rash    Physical Exam:  BP 107/76  Pulse 100  Temp 97.5 F  Resp 17  SpO2 100%  GENERAL:  Well-appearing, no distress.  SKIN:  Warm & Dry, no rash, no bruising.  HEAD:  Atraumatic. PERRL. Anicteric sclera. Oropharynx clear.  NECK:  Supple with full painless ROM at the neck  LUNGS:  Clear to auscultation bilaterally without rales, rhonchi or wheezing.   HEART: Tachycardia, regular rhythm.  No murmurs, rubs, or gallops.   ABDOMEN:  Soft, flat, without distension.  Nontender to palpation.   MUSCULOSKELETAL:  No deformities. Well-perfused extremities with  2+ DP/PT/Rad pulses bilaterally. No cyanosis or edema.  NEUROLOGIC:  Normal speech.  alert & oriented x 3, CNsII-XII grossly intact. Gait normal.  PSYCHIATRIC:  Normal affect    EKG: Sinus rhythm with PVCs at 74 beats per minute, normal axis, normal intervals, no ST elevations.    DIAGNOSTICS:     Results for orders placed or performed during the hospital encounter of 03/28/15 (from the past 24 hour(s))   CBC+Plt with Diff    Collection Time: 03/28/15  9:07 AM   Result Value    WHITE BLOOD CELL COUNT 6.9    RED BLOOD CELL COUNT 4.55    HEMOGLOBIN 13.9    HEMATOCRIT 40.2    MEAN CORPUSCULAR VOL 88.4    MEAN CORPUSCULAR HGB 30.5    MEAN CORP HGB CONC  34.6    RBC DISTRIBUTION WIDTH STD DEV 40.9    RBC DISTRIBUTION WIDTH 12.8    PLATELET COUNT 214    MEAN PLATELET VOLUME 11.4    NEUTROPHIL % 68.5    IMMATURE GRANULOCYTE % 0.3    LYMPHOCYTE % 21.2    MONOCYTE % 8.6    EOSINOPHIL % 1.0    BASOPHIL % 0.4    ABSOLUTE NEUTROPHIL COUNT 4.8    ABSOLUTE IMM GRAN COUNT 0.02    ABSOLUTE LYMPH COUNT 1.5    ABSOLUTE MONO COUNT 0.6    ABSOLUTE EOSINOPHIL COUNT 0.1    ABSOLUTE BASO COUNT 0.0   Comprehensive Metabolic Panel    Collection Time: 03/28/15  9:07 AM   Result Value    SODIUM 142    POTASSIUM 3.6    CHLORIDE 101    CARBON DIOXIDE 32    ANION GAP 9    CALCIUM 9.3    Glucose Random 98    BUN (UREA NITROGEN) 12    TOTAL PROTEIN 7.1    ALBUMIN 4.3    BILIRUBIN TOTAL 1.1 (H)    ALKALINE PHOSPHATASE 59    ASPARTATE AMINOTRANSFERASE 12    CREATININE 0.8    ESTIMATED GLOMERULAR FILT RATE > 60    ALANINE AMINOTRANSFERASE 23   Magnesium    Collection Time: 03/28/15  9:07 AM   Result Value    MAGNESIUM 2.1   POC Urinalysis    Collection Time: 03/28/15  9:37 AM   Result Value    COLOR YELLOW    CLARITY CLEAR    GLUCOSE,URINE NEGATIVE    BILIRUBIN, URINE SMALL (A)    KETONE, URINE 15 (A)    SPECIFIC GRAVITY, URINE 1.015    UROBILINOGEN URINE 0.2    OCCULT BLOOD, URINE NEGATIVE    PH URINE 7.5    PROTEIN, URINE 30 (A)    NITRITE, URINE NEGATIVE    LEUKOCYTE ESTERASE NEGATIVE   Urine Pregnancy (Point of Care)    Collection Time: 03/28/15  9:43 AM   Result Value    HCG QUALITATIVE URINE Negative    ONBOARD CONTROL PRESENT? Yes       ED Course and Medical Decision-making:    The patient is a 44 year old female with syncopal event prior to arrival.  EKG is nonischemic with normal intervals and she has no history of heart disease.  Syncopal event is most consistent with vasovagal episode.  Patient's labs show negative pregnancy with small amount of ketones in urine.  Electrolytes do not show abnormality and the patient is not expressing anemia.  The patient is feeling much better with IV  fluids in the emergency department and is requesting discharge.  She most likely experiencing a vasovagal syncopal event which may been related to some mild dehydration and poor p.o. Intake.  Patient is instructed to follow-up with her primary care physician.    VSS BP 107/76  Pulse 100  Temp 97.5 F  Resp 17  SpO2 100%    Reasons to return to the ED were reviewed in detail. The patient agrees with this plan and disposition.    Disposition: Discharge home  Diagnosis/Diagnoses:  Syncope, unspecified syncope type  Dehydration    Netta Corrigan, MD  Emergency Medicine Attending Physician  [facility]
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
## 2015-03-28 NOTE — ED Triage Note (Signed)
Pt reports "fainted " on the T today.  Arrived  Ambulatory alert, denies pain

## 2015-03-31 LAB — EKG

## 2015-04-14 ENCOUNTER — Ambulatory Visit (HOSPITAL_BASED_OUTPATIENT_CLINIC_OR_DEPARTMENT_OTHER): Payer: PRIVATE HEALTH INSURANCE | Admitting: Podiatrist

## 2015-04-14 DIAGNOSIS — G9050 Complex regional pain syndrome I, unspecified: Secondary | ICD-10-CM

## 2015-04-14 DIAGNOSIS — M79671 Pain in right foot: Secondary | ICD-10-CM

## 2015-04-14 DIAGNOSIS — G8929 Other chronic pain: Secondary | ICD-10-CM

## 2015-04-14 MED ORDER — DIAZEPAM 5 MG PO TABS
10.0000 mg | ORAL_TABLET | Freq: Two times a day (BID) | ORAL | 0 refills | Status: DC | PRN
Start: 2015-04-14 — End: 2015-05-14

## 2015-04-14 MED ORDER — TRAMADOL HCL 50 MG PO TABS
50.0000 mg | ORAL_TABLET | Freq: Two times a day (BID) | ORAL | 0 refills | Status: DC | PRN
Start: 2015-04-14 — End: 2015-05-14

## 2015-04-14 MED ORDER — ZOLPIDEM TARTRATE 10 MG PO TABS
10.0000 mg | ORAL_TABLET | Freq: Every evening | ORAL | 0 refills | Status: DC | PRN
Start: 2015-04-14 — End: 2015-05-14

## 2015-04-18 MED ORDER — DEXAMETHASONE SODIUM PHOSPHATE 4 MG/ML IJ SOLN
2.00 mg | Freq: Once | INTRAMUSCULAR | 0 refills | Status: AC
Start: 2015-04-18 — End: 2015-04-18

## 2015-04-18 MED ORDER — BUPIVACAINE LIPOSOME 1.3 % IJ SUSP
1.0000 mL | Freq: Once | INTRAMUSCULAR | Status: DC
Start: 2015-04-18 — End: 2017-09-27

## 2015-04-18 NOTE — Progress Notes (Signed)
Subjective:  CC: Right foot and leg pain  HPI: 44 year old female presents to clinic complaining of right foot and leg pain. This began following a crush injury, after which she subsequently developed RSD. She has been receiving regular injections of Exparel with dexamethasone on a PRN basis for pain. She states her symptoms have not changed since her last visit. Continues to lose weight, does walking and yoga for exercise.     ROS: Denies N/V/F/C/SOB/CP.    Objective:  GENERAL: Patient is pleasant and in NAD.  DERM: Skin is cool, dry, supple, BLE. Nails 1-5 well maintained, BLE. Hair present to the level of the digits. No open lesions, no SOI.  VASC: DP/PT palpable 2/4, bil. CFT < 3 sec, bil.  MSK: Strength to LE muscle groups 5/5, bil.  NEURO: Epicritic sensation intact via light touch, bil.    Assessment/Plan:  - 44 year old female presents with RSD of right leg and foot.  - 3 injections of 5 mL of Exparel and 0.25 mL of Dexamethasone to Posterior Tibial nerve, Common Peroneal nerve, and ring block around the ankle.  -Rx's renewed.  - Patient will RTC prn.

## 2015-05-14 ENCOUNTER — Ambulatory Visit (HOSPITAL_BASED_OUTPATIENT_CLINIC_OR_DEPARTMENT_OTHER): Payer: PRIVATE HEALTH INSURANCE | Admitting: Podiatrist

## 2015-05-14 DIAGNOSIS — G9050 Complex regional pain syndrome I, unspecified: Secondary | ICD-10-CM

## 2015-05-14 MED ORDER — DEXAMETHASONE SODIUM PHOSPHATE 4 MG/ML IJ SOLN
2.00 mg | Freq: Once | INTRAMUSCULAR | 0 refills | Status: AC
Start: 2015-05-14 — End: 2015-05-14

## 2015-05-14 MED ORDER — BUPIVACAINE LIPOSOME 1.3 % IJ SUSP
1.00 mL | Freq: Once | INTRAMUSCULAR | 0 refills | Status: AC
Start: 2015-05-14 — End: 2015-05-14

## 2015-05-14 MED ORDER — TRAMADOL HCL 50 MG PO TABS
50.0000 mg | ORAL_TABLET | Freq: Two times a day (BID) | ORAL | 0 refills | Status: DC | PRN
Start: 2015-05-14 — End: 2016-02-25

## 2015-05-14 MED ORDER — ZOLPIDEM TARTRATE 10 MG PO TABS
10.0000 mg | ORAL_TABLET | Freq: Every evening | ORAL | 0 refills | Status: DC | PRN
Start: 2015-05-14 — End: 2015-06-18

## 2015-05-14 MED ORDER — DIAZEPAM 5 MG PO TABS
10.0000 mg | ORAL_TABLET | Freq: Two times a day (BID) | ORAL | 0 refills | Status: DC | PRN
Start: 2015-05-14 — End: 2015-06-18

## 2015-05-14 NOTE — Progress Notes (Signed)
Date of Service: 05/14/2015    This is a 44 year old woman who returns to the clinic today for followup for chronic pain in her right foot and lower leg secondary to reflex sympathetic dystrophy and contusion to her right foot.  She reports that for approximately 2 weeks, she had no pain whatsoever, and then gradually the pain increased to the point where she is now, where she is unable to walk without limping.    Past medical history is unchanged since her last visit.  She denies fever, chills, nausea, vomiting, night sweats, shortness of breath, and calf pain.  She has palpable DP and PT pulses bilaterally.  Sensorium is grossly intact to her right foot.  She does have some coolness to her toes, but no obvious mottling of the skin is noted today.    We renewed several of her prescriptions today, and we also administered 3 nerve blocks utilizing Exparel.  Each one was approximately 5 mL of Exparel and a third of a mL of dexamethasone 4 mg/mL.  Injections were given to the common peroneal, posterior tibial, and also an ankle block was performed.  She tolerated injections at all 3 sites and reported significant improvement almost immediately.  Plan is for her to follow up with us on a p.r.n. basis.    ___________________________  Reviewed and Electronically Signed By: Naoma DienerADAM S Britnie Colville DPM  Sig Date: 05/14/2015  Sig Time: 21:13:29  Dictated By: Naoma DienerADAM S Mccrae Speciale DPM  Dict Date: 05/14/2015 Dict Time: 10 30 AM    Dictation Date and Time:05/14/2015 10:30:48  Transcription Date and Time:05/14/2015 10:50:32  eScription Dictation id: [id number] Confirmation # :[id number]

## 2015-06-18 ENCOUNTER — Ambulatory Visit (HOSPITAL_BASED_OUTPATIENT_CLINIC_OR_DEPARTMENT_OTHER): Payer: PRIVATE HEALTH INSURANCE | Admitting: Podiatrist

## 2015-06-18 ENCOUNTER — Encounter (HOSPITAL_BASED_OUTPATIENT_CLINIC_OR_DEPARTMENT_OTHER): Payer: Self-pay | Admitting: Podiatrist

## 2015-06-18 DIAGNOSIS — M79671 Pain in right foot: Secondary | ICD-10-CM

## 2015-06-18 DIAGNOSIS — G9050 Complex regional pain syndrome I, unspecified: Secondary | ICD-10-CM

## 2015-06-18 MED ORDER — ZOLPIDEM TARTRATE 10 MG PO TABS
10.0000 mg | ORAL_TABLET | Freq: Every evening | ORAL | 0 refills | Status: DC | PRN
Start: 2015-06-18 — End: 2015-07-28

## 2015-06-18 MED ORDER — DIAZEPAM 5 MG PO TABS
10.0000 mg | ORAL_TABLET | Freq: Two times a day (BID) | ORAL | 0 refills | Status: DC | PRN
Start: 2015-06-18 — End: 2015-07-28

## 2015-06-18 NOTE — Progress Notes (Signed)
Date of Service: 06/18/2015    SUBJECTIVE:  This is a 45 year old woman who returns to clinic today for followup for reflex sympathetic dystrophy to her right foot and lower leg.  She notes that about 3 days ago the pain became unbearable in her foot.  The foot is cool to touch and she does have evidence of mottled skin consistent with reflex sympathetic dystrophy.  She has been exercising on a regular basis as part of her treatment protocol for the RSD.    ASSESSMENT:  Treatment today consisted of common peroneal, posterior tibial, and ankle nerve blocks utilizing EXPAREL and dexamethasone.  She tolerated the 3 nerve blocks without any complications noted.      PLAN:  For her to follow up with us on a p.r.n. basis.  We also renewed her prescriptions.    ___________________________  Reviewed and Electronically Signed By: Naoma DienerADAM S Ariam Mol DPM  Sig Date: 06/18/2015  Sig Time: 21:49:05  Dictated By: Naoma DienerADAM S Constance Whittle DPM  Dict Date: 06/18/2015 Dict Time: 04 59 PM    Dictation Date and Time:06/18/2015 16:59:17  Transcription Date and Time:06/18/2015 19:05:10  eScription Dictation id: [id number] Confirmation # :[id number]

## 2015-06-25 MED ORDER — DEXAMETHASONE SODIUM PHOSPHATE 4 MG/ML IJ SOLN
2.00 mg | Freq: Once | INTRAMUSCULAR | 0 refills | Status: AC
Start: 2015-06-25 — End: 2015-06-25

## 2015-06-25 MED ORDER — BUPIVACAINE LIPOSOME 1.3 % IJ SUSP
1.00 mL | Freq: Once | INTRAMUSCULAR | 0 refills | Status: AC
Start: 2015-06-25 — End: 2015-06-25

## 2015-07-10 ENCOUNTER — Encounter (HOSPITAL_BASED_OUTPATIENT_CLINIC_OR_DEPARTMENT_OTHER): Payer: Self-pay | Admitting: Podiatrist

## 2015-07-10 MED ORDER — AZITHROMYCIN 250 MG PO TABS
250.00 mg | ORAL_TABLET | Freq: Every day | ORAL | 0 refills | Status: AC
Start: 2015-07-10 — End: 2015-07-15

## 2015-07-28 ENCOUNTER — Other Ambulatory Visit (HOSPITAL_BASED_OUTPATIENT_CLINIC_OR_DEPARTMENT_OTHER): Payer: Self-pay | Admitting: Podiatrist

## 2015-07-28 ENCOUNTER — Ambulatory Visit (HOSPITAL_BASED_OUTPATIENT_CLINIC_OR_DEPARTMENT_OTHER): Payer: PRIVATE HEALTH INSURANCE | Admitting: Podiatrist

## 2015-07-28 DIAGNOSIS — G9050 Complex regional pain syndrome I, unspecified: Secondary | ICD-10-CM

## 2015-07-28 MED ORDER — ZOLPIDEM TARTRATE 10 MG PO TABS
10.0000 mg | ORAL_TABLET | Freq: Every evening | ORAL | 0 refills | Status: DC | PRN
Start: 2015-07-28 — End: 2015-09-05

## 2015-07-28 MED ORDER — KETOROLAC TROMETHAMINE 10 MG PO TABS
10.0000 mg | ORAL_TABLET | Freq: Three times a day (TID) | ORAL | 0 refills | Status: DC | PRN
Start: 2015-07-28 — End: 2016-01-12

## 2015-07-28 MED ORDER — KETOROLAC TROMETHAMINE 10 MG PO TABS
10.0000 mg | ORAL_TABLET | Freq: Three times a day (TID) | ORAL | 0 refills | Status: DC | PRN
Start: 2015-07-28 — End: 2015-07-28

## 2015-07-28 MED ORDER — DIAZEPAM 5 MG PO TABS
10.0000 mg | ORAL_TABLET | Freq: Two times a day (BID) | ORAL | 0 refills | Status: DC | PRN
Start: 2015-07-28 — End: 2015-09-05

## 2015-07-28 MED ORDER — DIAZEPAM 5 MG PO TABS
10.0000 mg | ORAL_TABLET | Freq: Two times a day (BID) | ORAL | 0 refills | Status: DC | PRN
Start: 2015-07-28 — End: 2015-07-28

## 2015-07-28 MED ORDER — ZOLPIDEM TARTRATE 10 MG PO TABS
10.0000 mg | ORAL_TABLET | Freq: Every evening | ORAL | 0 refills | Status: DC | PRN
Start: 2015-07-28 — End: 2015-07-28

## 2015-07-29 ENCOUNTER — Other Ambulatory Visit (HOSPITAL_BASED_OUTPATIENT_CLINIC_OR_DEPARTMENT_OTHER): Payer: Self-pay | Admitting: Podiatrist

## 2015-07-29 MED ORDER — AZITHROMYCIN 250 MG PO TABS
250.00 mg | ORAL_TABLET | Freq: Every day | ORAL | 0 refills | Status: AC
Start: 2015-07-29 — End: 2015-08-03

## 2015-08-16 MED ORDER — DEXAMETHASONE SODIUM PHOSPHATE 4 MG/ML IJ SOLN
2.00 mg | Freq: Once | INTRAMUSCULAR | 0 refills | Status: AC
Start: 2015-08-16 — End: 2015-08-16

## 2015-08-16 MED ORDER — BUPIVACAINE LIPOSOME 1.3 % IJ SUSP
1.00 mL | Freq: Once | INTRAMUSCULAR | 0 refills | Status: AC
Start: 2015-08-16 — End: 2015-08-16

## 2015-08-16 NOTE — Progress Notes (Signed)
Subjective:  CC: Right foot and leg pain  HPI: 45 year old female presents to clinic complaining of right foot and leg pain. This began following a crush injury, after which she subsequently developed RSD. She has been receiving regular injections of Exparel with dexamethasone on a PRN basis for pain. She states her symptoms have not changed since her last visit. Continues to lose weight, does walking and yoga for exercise.      ROS: Denies N/V/F/C/SOB/CP.      Objective:  GENERAL: Patient is pleasant and in NAD.  DERM: Skin is cool, dry, supple, BLE. Nails 1-5 well maintained, BLE. Hair present to the level of the digits. No open lesions, no SOI.  VASC: DP/PT palpable 2/4, bil. CFT < 3 sec, bil.  MSK: Strength to LE muscle groups 5/5, bil.  NEURO: Epicritic sensation intact via light touch, bil.      Assessment/Plan:  - 45 year old female presents with RSD of right leg and foot.  - 3 injections of 5 mL of Exparel and 0.25 mL of Dexamethasone to Posterior Tibial nerve, Common Peroneal nerve, and ring block around the ankle.  -Rx's renewed.  - Patient will RTC prn.

## 2015-09-05 ENCOUNTER — Ambulatory Visit (HOSPITAL_BASED_OUTPATIENT_CLINIC_OR_DEPARTMENT_OTHER): Payer: PRIVATE HEALTH INSURANCE | Admitting: Podiatrist

## 2015-09-05 DIAGNOSIS — G9050 Complex regional pain syndrome I, unspecified: Secondary | ICD-10-CM

## 2015-09-05 DIAGNOSIS — M79671 Pain in right foot: Secondary | ICD-10-CM

## 2015-09-05 DIAGNOSIS — G8929 Other chronic pain: Secondary | ICD-10-CM

## 2015-09-05 MED ORDER — ZOLPIDEM TARTRATE 10 MG PO TABS
10.0000 mg | ORAL_TABLET | Freq: Every evening | ORAL | 0 refills | Status: DC | PRN
Start: 2015-09-05 — End: 2015-10-27

## 2015-09-05 MED ORDER — DIAZEPAM 5 MG PO TABS
10.0000 mg | ORAL_TABLET | Freq: Two times a day (BID) | ORAL | 0 refills | Status: DC | PRN
Start: 2015-09-05 — End: 2015-10-27

## 2015-09-05 NOTE — Progress Notes (Signed)
Date of Service: 09/05/2015    SUBJECTIVE:  This is a 45 year old woman who returns to clinic today for ongoing treatments for reflex sympathetic dystrophy. It has been approximately 4 weeks since her last series of injections. Today she came in for nerve blocks to the posterior tibial, common peroneal and ankle, all performed on the right foot and lower leg.  Three nerve blocks were performed with 5 mL of Exparel and one-third of a milliliter of dexamethasone 4 mg/mL.  She tolerated 3 nerve blocks well without any complications noted.      PLAN:  I have asked her to follow up with us on a p.r.n. basis.  She continues to have evidence of reflux sympathetic dystrophy, including coolness to touch and chronic hypersensitivity.    ___________________________  Reviewed and Electronically Signed By: Naoma DienerADAM S Dyonna Jaspers DPM  Sig Date: 09/05/2015  Sig Time: 21:30:39  Dictated By: Naoma DienerADAM S Errik Mitchelle DPM  Dict Date: 09/05/2015 Dict Time: 05 11 PM    Dictation Date and Time:09/05/2015 17:11:49  Transcription Date and Time:09/05/2015 18:24:25  eScription Dictation id: [id number] Confirmation # :[id number]

## 2015-09-07 MED ORDER — BUPIVACAINE LIPOSOME 1.3 % IJ SUSP
1.00 mL | Freq: Once | INTRAMUSCULAR | 0 refills | Status: AC
Start: 2015-09-07 — End: 2015-09-07

## 2015-09-07 MED ORDER — DEXAMETHASONE SODIUM PHOSPHATE 4 MG/ML IJ SOLN
2.00 mg | Freq: Once | INTRAMUSCULAR | 0 refills | Status: AC
Start: 2015-09-07 — End: 2015-09-07

## 2015-10-27 ENCOUNTER — Ambulatory Visit (HOSPITAL_BASED_OUTPATIENT_CLINIC_OR_DEPARTMENT_OTHER): Payer: PRIVATE HEALTH INSURANCE | Admitting: Podiatrist

## 2015-10-27 VITALS — BP 93/59 | HR 75 | Temp 97.3°F

## 2015-10-27 DIAGNOSIS — G9050 Complex regional pain syndrome I, unspecified: Secondary | ICD-10-CM

## 2015-10-27 DIAGNOSIS — M79671 Pain in right foot: Secondary | ICD-10-CM

## 2015-10-27 MED ORDER — ZOLPIDEM TARTRATE 10 MG PO TABS
10.0000 mg | ORAL_TABLET | Freq: Every evening | ORAL | 0 refills | Status: DC | PRN
Start: 2015-10-27 — End: 2015-12-03

## 2015-10-27 MED ORDER — DIAZEPAM 5 MG PO TABS
10.0000 mg | ORAL_TABLET | Freq: Two times a day (BID) | ORAL | 0 refills | Status: DC | PRN
Start: 2015-10-27 — End: 2015-12-03
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
## 2015-11-05 MED ORDER — BUPIVACAINE LIPOSOME 1.3 % IJ SUSP
1.00 mL | Freq: Once | INTRAMUSCULAR | 0 refills | Status: AC
Start: 2015-11-05 — End: 2015-11-05

## 2015-11-05 MED ORDER — DEXAMETHASONE SODIUM PHOSPHATE 4 MG/ML IJ SOLN
2.00 mg | Freq: Once | INTRAMUSCULAR | 0 refills | Status: AC
Start: 2015-11-05 — End: 2015-11-05

## 2015-12-03 ENCOUNTER — Ambulatory Visit (HOSPITAL_BASED_OUTPATIENT_CLINIC_OR_DEPARTMENT_OTHER): Payer: PRIVATE HEALTH INSURANCE | Admitting: Podiatrist

## 2015-12-03 DIAGNOSIS — G9050 Complex regional pain syndrome I, unspecified: Secondary | ICD-10-CM

## 2015-12-03 DIAGNOSIS — G8929 Other chronic pain: Secondary | ICD-10-CM

## 2015-12-03 DIAGNOSIS — M79671 Pain in right foot: Secondary | ICD-10-CM

## 2015-12-03 MED ORDER — DIAZEPAM 5 MG PO TABS
10.0000 mg | ORAL_TABLET | Freq: Two times a day (BID) | ORAL | 0 refills | Status: DC | PRN
Start: 2015-12-03 — End: 2016-01-12

## 2015-12-03 MED ORDER — ZOLPIDEM TARTRATE 10 MG PO TABS
10.0000 mg | ORAL_TABLET | Freq: Every evening | ORAL | 0 refills | Status: DC | PRN
Start: 2015-12-03 — End: 2016-01-12

## 2015-12-03 NOTE — Progress Notes (Addendum)
CC: Right foot pain    HPI: Madison RockerSusan Mckay is a 45 year old female who presents to the clinic for ongoing treatments for reflex sympathetic dystrophy. It has been about 1 month since her last treatment. She states that she is here today to have repeat injections to her right leg. She reports that the injections eliminate her pain but after about 4 weeks the pain returns. She reports that she had pain return 3 days ago. She states that most of the pain is in the plantar aspect of the right forefoot and it spreads to the dorsal aspect and up the leg. Patients rates pain as 9/10. Additionally, she reports that she exercises frequently. Patient denies any other pedal complaints at this time.    ROS: All system negative except HPI. Patient denies F/C/N/V/SB/CP      Objective:    Gen: A&Ox3, NAD, Mood is pleasant    Derm: Normal skin turgor, texture and temperature b/l. Webspaces 1-4 were clean dry and intact.    Vasc: DP/PT palpable, cap refill <3sec, skin temperature warm to cold from proximal to distal.    Neuro: Light touch sensation intact b/l. Hypersensitivity noted to the right foot.    Ortho: Deferred due to pain from reflex sympathetic dystrophy.      Assessment: Reflex sympathetic dystrophy.      Plan:    Patient education regarding etiology and answered all appropriate questions. Treatment options discussed including repeat injections. Patient elects to have injections at this time. Areas were prepped with alcohol. 3 injections drawn consisting of 5 mL of Exparel and 0.33 mL dexamethasone 4 mg/mL. Patient received nerve blocks to the posterior tibial, common peroneal and ankle of the right foot and lower leg.  Patient tolerated procedure well. Patient dispensed prescription for Zolpidem. Patient dispensed prescription for Diazepam. Patient RTC prn

## 2015-12-06 MED ORDER — BUPIVACAINE LIPOSOME 1.3 % IJ SUSP
1.00 mL | Freq: Once | INTRAMUSCULAR | 0 refills | Status: AC
Start: 2015-12-06 — End: 2015-12-06

## 2015-12-06 MED ORDER — DEXAMETHASONE SODIUM PHOSPHATE 4 MG/ML IJ SOLN
2.00 mg | Freq: Once | INTRAMUSCULAR | 0 refills | Status: AC
Start: 2015-12-06 — End: 2015-12-06

## 2016-01-12 ENCOUNTER — Ambulatory Visit (HOSPITAL_BASED_OUTPATIENT_CLINIC_OR_DEPARTMENT_OTHER): Payer: PRIVATE HEALTH INSURANCE | Admitting: Podiatrist

## 2016-01-12 DIAGNOSIS — G90521 Complex regional pain syndrome I of right lower limb: Secondary | ICD-10-CM

## 2016-01-12 MED ORDER — KETOROLAC TROMETHAMINE 10 MG PO TABS
10.0000 mg | ORAL_TABLET | Freq: Three times a day (TID) | ORAL | 0 refills | Status: DC | PRN
Start: 2016-01-12 — End: 2016-10-27

## 2016-01-12 MED ORDER — DIAZEPAM 5 MG PO TABS
10.0000 mg | ORAL_TABLET | Freq: Two times a day (BID) | ORAL | 0 refills | Status: DC | PRN
Start: 2016-01-12 — End: 2016-02-25

## 2016-01-12 MED ORDER — ZOLPIDEM TARTRATE 10 MG PO TABS
10.0000 mg | ORAL_TABLET | Freq: Every evening | ORAL | 0 refills | Status: DC | PRN
Start: 2016-01-12 — End: 2016-02-25

## 2016-01-23 MED ORDER — BUPIVACAINE LIPOSOME 1.3 % IJ SUSP
1.00 mL | Freq: Once | INTRAMUSCULAR | 0 refills | Status: AC
Start: 2016-01-23 — End: 2016-01-23

## 2016-01-23 MED ORDER — DEXAMETHASONE SODIUM PHOSPHATE 4 MG/ML IJ SOLN
2.00 mg | Freq: Once | INTRAMUSCULAR | 0 refills | Status: AC
Start: 2016-01-23 — End: 2016-01-23
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
## 2016-02-25 ENCOUNTER — Ambulatory Visit (HOSPITAL_BASED_OUTPATIENT_CLINIC_OR_DEPARTMENT_OTHER): Payer: PRIVATE HEALTH INSURANCE | Admitting: Podiatrist

## 2016-02-25 ENCOUNTER — Telehealth (HOSPITAL_BASED_OUTPATIENT_CLINIC_OR_DEPARTMENT_OTHER): Payer: Self-pay | Admitting: Licensed Practical Nurse

## 2016-02-25 VITALS — BP 107/69 | HR 70 | Temp 97.4°F

## 2016-02-25 DIAGNOSIS — G9050 Complex regional pain syndrome I, unspecified: Secondary | ICD-10-CM

## 2016-02-25 DIAGNOSIS — G8929 Other chronic pain: Secondary | ICD-10-CM

## 2016-02-25 DIAGNOSIS — M79671 Pain in right foot: Secondary | ICD-10-CM

## 2016-02-25 MED ORDER — TRAMADOL HCL 50 MG PO TABS
50.0000 mg | ORAL_TABLET | Freq: Two times a day (BID) | ORAL | 0 refills | Status: DC | PRN
Start: 2016-02-25 — End: 2016-08-25

## 2016-02-25 MED ORDER — DIAZEPAM 5 MG PO TABS: 10 mg | tablet | Freq: Two times a day (BID) | ORAL | 0 refills | 0 days | Status: DC | PRN

## 2016-02-25 MED ORDER — TRAMADOL HCL 50 MG PO TABS: 50 mg | tablet | Freq: Two times a day (BID) | ORAL | 0 refills | 0 days | Status: DC | PRN

## 2016-02-25 MED ORDER — ZOLPIDEM TARTRATE 10 MG PO TABS
10.0000 mg | ORAL_TABLET | Freq: Every evening | ORAL | 0 refills | Status: DC | PRN
Start: 2016-02-25 — End: 2016-03-31

## 2016-02-25 MED ORDER — ZOLPIDEM TARTRATE 10 MG PO TABS: 10 mg | tablet | Freq: Every evening | ORAL | 0 refills | 0 days | Status: DC | PRN

## 2016-02-25 MED ORDER — DIAZEPAM 5 MG PO TABS
10.0000 mg | ORAL_TABLET | Freq: Two times a day (BID) | ORAL | 0 refills | Status: DC | PRN
Start: 2016-02-25 — End: 2016-03-31

## 2016-02-25 NOTE — Progress Notes (Signed)
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

## 2016-02-25 NOTE — Progress Notes (Signed)
Patient has [employer] for prescription coverage  Member ID # [id number]  [place] Phone #[phone], Spoke with Jon GillsAlexis     Tramadol 50 mg has been APPROVED for 1 month ([id number](PA#[id number]) Copay $2.09      The patient and their preferred pharmacy are aware of the approval.

## 2016-02-25 NOTE — Telephone Encounter (Signed)
-----   Message from Oretha Ellisanielle Amaral sent at 02/25/2016 11:16 AM EDT -----  Regarding: Prior Auth    RX -  traMADol (ULTRAM) 50 MG tablet need prior auth    Thanks  [employer] Stationers[employer]le

## 2016-03-04 MED ORDER — BUPIVACAINE LIPOSOME 1.3 % IJ SUSP: 1 mL | mL | Freq: Once | 0 refills | 0 days | Status: AC

## 2016-03-04 MED ORDER — DEXAMETHASONE SODIUM PHOSPHATE 4 MG/ML IJ SOLN: 2 mg | mL | Freq: Once | INTRAMUSCULAR | 0 refills | 0 days | Status: AC

## 2016-03-04 MED ORDER — BUPIVACAINE LIPOSOME 1.3 % IJ SUSP
1.00 mL | Freq: Once | INTRAMUSCULAR | 0 refills | Status: AC
Start: 2016-03-04 — End: 2016-03-04

## 2016-03-04 MED ORDER — DEXAMETHASONE SODIUM PHOSPHATE 4 MG/ML IJ SOLN
2.00 mg | Freq: Once | INTRAMUSCULAR | 0 refills | Status: AC
Start: 2016-03-04 — End: 2016-03-04

## 2016-03-04 NOTE — Progress Notes (Signed)
CC: Right foot pain    HPI: Madison Mckay is a 45 year old female who presents to the clinic for ongoing treatments for reflex sympathetic dystrophy. It has been about 1 month since her last treatment. She states that she is here today to have repeat injections to her right leg. She reports that the injections eliminate her pain but after about 4 weeks the pain returns. She reports that she had pain return 3 days ago. She states that most of the pain is in the plantar aspect of the right forefoot and it spreads to the dorsal aspect and up the leg. Patients rates pain as 9/10. Additionally, she reports that she exercises frequently. Patient denies any other pedal complaints at this time.    ROS: All system negative except HPI. Patient denies F/C/N/V/SB/CP      Objective:    Gen: A&Ox3, NAD, Mood is pleasant    Derm: Normal skin turgor, texture and temperature b/l. Webspaces 1-4 were clean dry and intact.    Vasc: DP/PT palpable, cap refill <3sec, skin temperature warm to cold from proximal to distal.    Neuro: Light touch sensation intact b/l. Hypersensitivity noted to the right foot.    Ortho: Deferred due to pain from reflex sympathetic dystrophy.      Assessment: Reflex sympathetic dystrophy, right foot.      Plan:    Patient education regarding etiology and answered all appropriate questions. Treatment options discussed including repeat injections. Patient elects to have injections at this time. Areas were prepped with alcohol. 3 injections drawn consisting of 5 mL of Exparel and 0.33 mL dexamethasone 4 mg/mL. Patient received nerve blocks to the posterior tibial, common peroneal and ankle of the right foot and lower leg.  Patient tolerated procedure well. Patient dispensed prescription for Zolpidem. Patient dispensed prescription for Diazepam. Patient RTC prn

## 2016-03-31 ENCOUNTER — Ambulatory Visit (HOSPITAL_BASED_OUTPATIENT_CLINIC_OR_DEPARTMENT_OTHER): Payer: PRIVATE HEALTH INSURANCE | Admitting: Podiatrist

## 2016-03-31 DIAGNOSIS — G9050 Complex regional pain syndrome I, unspecified: Secondary | ICD-10-CM

## 2016-03-31 MED ORDER — ZOLPIDEM TARTRATE 10 MG PO TABS: 10 mg | tablet | Freq: Every evening | ORAL | 0 refills | 0 days | Status: DC | PRN

## 2016-03-31 MED ORDER — DIAZEPAM 5 MG PO TABS
10.0000 mg | ORAL_TABLET | Freq: Two times a day (BID) | ORAL | 0 refills | Status: DC | PRN
Start: 2016-03-31 — End: 2016-05-10

## 2016-03-31 MED ORDER — DIAZEPAM 5 MG PO TABS: 10 mg | tablet | Freq: Two times a day (BID) | ORAL | 0 refills | 0 days | Status: DC | PRN

## 2016-03-31 MED ORDER — ZOLPIDEM TARTRATE 10 MG PO TABS
10.0000 mg | ORAL_TABLET | Freq: Every evening | ORAL | 0 refills | Status: DC | PRN
Start: 2016-03-31 — End: 2016-05-10

## 2016-04-01 NOTE — Progress Notes (Signed)
Date of Service: 03/31/2016    SUBJECTIVE:  This is a 45 year old female who presents to the office today for followup for right foot and lower leg reflex sympathetic dystrophy.  She reports that she had about 3 weeks of relief from her last injection and presents today with pain, which is approximately an 8/10.    OBJECTIVE:  Her foot is normal in temperature to touch, and her skin does not appear mottled.  She does have hypersensitivity throughout the right foot.     ASSESSMENT AND PLAN:  Previously, we have had good success with a series of nerve blocks, and this will be repeated today.  We administered a common peroneal, a posterior tibial, and an ankle block utilizing a total of 5 mL of Exparel with a third of a mL of dexamethasone 4 mg/mL to each of the 3 sites.  She tolerated the injections well, without any complications noted, and did note almost immediate relief in her painful symptoms.  We did check her for drop foot following the common peroneal injection, and as of the time that she left the office, she did not have any issues with regard to drop foot.  Plan is to follow up with her on a p.r.n. basis.    ___________________________  Reviewed and Electronically Signed By: Naoma DienerADAM S Laquitta Dominski DPM  Sig Date: 04/04/2016  Sig Time: 20:53:34  Dictated By: Naoma DienerADAM S Monika Chestang DPM  Dict Date: 03/31/2016 Dict Time: 02 47 PM    Dictation Date and Time:03/31/2016 14:47:26  Transcription Date and Time:03/31/2016 15:13:32  eScription Dictation id: [id number] Confirmation # :[id number]

## 2016-04-30 MED ORDER — DEXAMETHASONE SODIUM PHOSPHATE 4 MG/ML IJ SOLN
2.00 mg | Freq: Once | INTRAMUSCULAR | 0 refills | Status: AC
Start: 2016-04-30 — End: 2016-04-30

## 2016-04-30 MED ORDER — BUPIVACAINE LIPOSOME 1.3 % IJ SUSP
1.00 mL | Freq: Once | INTRAMUSCULAR | 0 refills | Status: AC
Start: 2016-04-30 — End: 2016-04-30

## 2016-04-30 MED ORDER — BUPIVACAINE LIPOSOME 1.3 % IJ SUSP: 1 mL | mL | Freq: Once | 0 refills | 0 days | Status: AC

## 2016-04-30 MED ORDER — DEXAMETHASONE SODIUM PHOSPHATE 4 MG/ML IJ SOLN: 2 mg | mL | Freq: Once | INTRAMUSCULAR | 0 refills | 0 days | Status: AC
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
## 2016-05-10 ENCOUNTER — Ambulatory Visit (HOSPITAL_BASED_OUTPATIENT_CLINIC_OR_DEPARTMENT_OTHER): Payer: PRIVATE HEALTH INSURANCE | Admitting: Podiatrist

## 2016-05-10 DIAGNOSIS — G8929 Other chronic pain: Secondary | ICD-10-CM

## 2016-05-10 DIAGNOSIS — G9050 Complex regional pain syndrome I, unspecified: Secondary | ICD-10-CM

## 2016-05-10 DIAGNOSIS — M79671 Pain in right foot: Secondary | ICD-10-CM

## 2016-05-10 MED ORDER — ZOLPIDEM TARTRATE 10 MG PO TABS: 10 mg | tablet | Freq: Every evening | ORAL | 0 refills | 0 days | Status: DC | PRN

## 2016-05-10 MED ORDER — DIAZEPAM 5 MG PO TABS
10.0000 mg | ORAL_TABLET | Freq: Two times a day (BID) | ORAL | 0 refills | Status: DC | PRN
Start: 2016-05-10 — End: 2016-05-26

## 2016-05-10 MED ORDER — DIAZEPAM 5 MG PO TABS: 10 mg | tablet | Freq: Two times a day (BID) | ORAL | 0 refills | 0 days | Status: DC | PRN

## 2016-05-10 MED ORDER — ZOLPIDEM TARTRATE 10 MG PO TABS
10.0000 mg | ORAL_TABLET | Freq: Every evening | ORAL | 0 refills | Status: DC | PRN
Start: 2016-05-10 — End: 2016-05-26

## 2016-05-10 NOTE — Progress Notes (Signed)
Date of Service: 05/10/2016    SUBJECTIVE:  This is a 45 year old female patient returns to the clinic today with a chief concern of reflex sympathetic dystrophy and also with symptoms of Raynaud's disease.  Today, she presents with the tips of all of her toes on the right foot turning blue.  The toes on the left foot do not have the same appearance.    PAST MEDICAL HISTORY:  Reviewed.    REVIEW OF SYSTEMS:  She denies fever or chills, nausea, vomiting, night sweats, shortness of breath, and calf pain.    OBJECTIVE:  She has palpable DP and PT pulses.  Sensorium is grossly intact.  She has 5/5 muscle strength bilaterally.      ASSESSMENT AND PLAN:  She reports that she has had excruciating pain, which has been preventing her from sleeping due to anything even light touching her right foot.  This has been going on for about 3 or 4 days.  Today we administered nerve block to the posterior tibial, common peroneal and to her ankle all on the right foot.  Each injection consisted of 5 mL of Exparel and a third of a mL of dexamethasone 4 mg/mL.  She tolerated it 3 injections well without any complications noted.  Plan is for her to follow up with us on an as needed basis.    ___________________________  Reviewed and Electronically Signed By: Naoma DienerADAM S Shawny Borkowski DPM  Sig Date: 05/10/2016  Sig Time: 21:50:59  Dictated By: Naoma DienerADAM S Shamari Lofquist DPM  Dict Date: 05/10/2016 Dict Time: 05 01 PM    Dictation Date and Time:05/10/2016 17:01:11  Transcription Date and Time:05/10/2016 18:53:05  eScription Dictation id: [id number] Confirmation # :[id number]

## 2016-05-26 ENCOUNTER — Ambulatory Visit (HOSPITAL_BASED_OUTPATIENT_CLINIC_OR_DEPARTMENT_OTHER): Payer: PRIVATE HEALTH INSURANCE | Admitting: Podiatrist

## 2016-05-26 DIAGNOSIS — G9050 Complex regional pain syndrome I, unspecified: Secondary | ICD-10-CM

## 2016-05-26 MED ORDER — ZOLPIDEM TARTRATE 10 MG PO TABS
10.0000 mg | ORAL_TABLET | Freq: Every evening | ORAL | 0 refills | Status: DC | PRN
Start: 2016-05-26 — End: 2016-07-02

## 2016-05-26 MED ORDER — DIAZEPAM 5 MG PO TABS
10.0000 mg | ORAL_TABLET | Freq: Two times a day (BID) | ORAL | 0 refills | Status: DC | PRN
Start: 2016-05-26 — End: 2016-07-02

## 2016-05-26 MED ORDER — ZOLPIDEM TARTRATE 10 MG PO TABS: 10 mg | tablet | Freq: Every evening | ORAL | 0 refills | 0 days | Status: DC | PRN

## 2016-05-26 MED ORDER — DIAZEPAM 5 MG PO TABS: 10 mg | tablet | Freq: Two times a day (BID) | ORAL | 0 refills | 0 days | Status: DC | PRN

## 2016-05-26 MED ORDER — OXYCODONE-ACETAMINOPHEN 5-325 MG PO TABS
1.00 | ORAL_TABLET | Freq: Three times a day (TID) | ORAL | 0 refills | Status: AC | PRN
Start: 2016-05-26 — End: 2016-06-02

## 2016-05-26 MED ORDER — OXYCODONE-ACETAMINOPHEN 5-325 MG PO TABS: 1 | tablet | Freq: Three times a day (TID) | ORAL | 0 refills | 0 days | Status: AC | PRN

## 2016-05-29 MED ORDER — BUPIVACAINE LIPOSOME 1.3 % IJ SUSP: 1 mL | mL | Freq: Once | 0 refills | 0 days | Status: AC

## 2016-05-29 MED ORDER — BUPIVACAINE LIPOSOME 1.3 % IJ SUSP
1.00 mL | Freq: Once | INTRAMUSCULAR | 0 refills | Status: AC
Start: 2016-05-29 — End: 2016-05-29

## 2016-05-29 MED ORDER — DEXAMETHASONE SODIUM PHOSPHATE 4 MG/ML IJ SOLN
2.00 mg | Freq: Once | INTRAMUSCULAR | 0 refills | Status: AC
Start: 2016-05-29 — End: 2016-05-29

## 2016-05-29 MED ORDER — DEXAMETHASONE SODIUM PHOSPHATE 4 MG/ML IJ SOLN: 2 mg | mL | Freq: Once | INTRAMUSCULAR | 0 refills | 0 days | Status: AC

## 2016-06-10 MED ORDER — BUPIVACAINE LIPOSOME 1.3 % IJ SUSP
1.00 mL | Freq: Once | INTRAMUSCULAR | 0 refills | Status: AC
Start: 2016-06-10 — End: 2016-06-10

## 2016-06-10 MED ORDER — BUPIVACAINE LIPOSOME 1.3 % IJ SUSP: 1 mL | mL | Freq: Once | 0 refills | 0 days | Status: AC

## 2016-06-10 MED ORDER — DEXAMETHASONE SODIUM PHOSPHATE 4 MG/ML IJ SOLN: 2 mg | mL | Freq: Once | INTRAMUSCULAR | 0 refills | 0 days | Status: AC

## 2016-06-10 MED ORDER — DEXAMETHASONE SODIUM PHOSPHATE 4 MG/ML IJ SOLN
2.00 mg | Freq: Once | INTRAMUSCULAR | 0 refills | Status: AC
Start: 2016-06-10 — End: 2016-06-10

## 2016-06-10 NOTE — Progress Notes (Signed)
SUBJECTIVE:  This is a 46 year old female patient returns to the clinic today with a chief concern of reflex sympathetic dystrophy and also with symptoms of Raynaud's disease.  Today, she presents with the tips of all of her toes on the right foot turning blue.  The toes on the left foot do not have the same appearance.  Her pain is exceptionally bad today.    PAST MEDICAL HISTORY:  Reviewed.    REVIEW OF SYSTEMS:  She denies fever or chills, nausea, vomiting, night sweats, shortness of breath, and calf pain.    OBJECTIVE:  She has palpable DP and PT pulses.  Sensorium is grossly intact.  She has 5/5 muscle strength bilaterally.      ASSESSMENT AND PLAN:  She reports that she has had excruciating pain, which has been preventing her from sleeping due to anything even light touching her right foot.  This has been going on for about 3 or 4 days.  Today we administered nerve block to the posterior tibial, common peroneal and to her ankle all on the right foot.  Each injection consisted of 5 mL of Exparel and a third of a mL of dexamethasone 4 mg/mL.  She tolerated it 3 injections well without any complications noted.  Plan is for her to follow up with us on an as needed basis.

## 2016-07-02 ENCOUNTER — Encounter (HOSPITAL_BASED_OUTPATIENT_CLINIC_OR_DEPARTMENT_OTHER): Payer: Self-pay | Admitting: Podiatrist

## 2016-07-02 ENCOUNTER — Ambulatory Visit (HOSPITAL_BASED_OUTPATIENT_CLINIC_OR_DEPARTMENT_OTHER): Payer: PRIVATE HEALTH INSURANCE | Admitting: Podiatrist

## 2016-07-02 DIAGNOSIS — G9050 Complex regional pain syndrome I, unspecified: Secondary | ICD-10-CM

## 2016-07-02 DIAGNOSIS — M79671 Pain in right foot: Secondary | ICD-10-CM

## 2016-07-02 MED ORDER — ZOLPIDEM TARTRATE 10 MG PO TABS
10.0000 mg | ORAL_TABLET | Freq: Every evening | ORAL | 0 refills | Status: DC | PRN
Start: 2016-07-02 — End: 2016-07-28

## 2016-07-02 MED ORDER — DIAZEPAM 5 MG PO TABS: 10 mg | tablet | Freq: Two times a day (BID) | ORAL | 0 refills | 0 days | Status: DC | PRN

## 2016-07-02 MED ORDER — DIAZEPAM 5 MG PO TABS
10.0000 mg | ORAL_TABLET | Freq: Two times a day (BID) | ORAL | 0 refills | Status: DC | PRN
Start: 2016-07-02 — End: 2016-07-28

## 2016-07-02 MED ORDER — BUPIVACAINE LIPOSOME 1.3 % IJ SUSP
1.00 mL | Freq: Once | INTRAMUSCULAR | 0 refills | Status: AC
Start: 2016-07-02 — End: 2016-07-02

## 2016-07-02 MED ORDER — DEXAMETHASONE SODIUM PHOSPHATE 4 MG/ML IJ SOLN: 2 mg | mL | Freq: Once | INTRAMUSCULAR | 0 refills | 0 days | Status: AC

## 2016-07-02 MED ORDER — ZOLPIDEM TARTRATE 10 MG PO TABS: 10 mg | tablet | Freq: Every evening | ORAL | 0 refills | 0 days | Status: DC | PRN

## 2016-07-02 MED ORDER — DEXAMETHASONE SODIUM PHOSPHATE 4 MG/ML IJ SOLN
2.00 mg | Freq: Once | INTRAMUSCULAR | 0 refills | Status: AC
Start: 2016-07-02 — End: 2016-07-02

## 2016-07-02 MED ORDER — BUPIVACAINE LIPOSOME 1.3 % IJ SUSP: 1 mL | mL | Freq: Once | 0 refills | 0 days | Status: AC

## 2016-07-02 NOTE — Progress Notes (Signed)
Date of Service: 07/02/2016    SUBJECTIVE:  This is a 46 year old female patient who returns to the clinic today with a chief concern of ongoing pain in her foot.  She has a prior history of reflex sympathetic dystrophy.      OBJECTIVE:  Today, she presents with skin that is cold to the touch and bluish discoloration at the tips of her toes.  She denies any new complications or changes in her past medical history.      ASSESSMENT:  Previously, she has had excellent success with periodic injections with Exparel and today we will administer 3 nerve blocks to her common peroneal, her posterior tibial, and her ankle.    PLAN:  Each injection consisted of 0.5 mL of Exparel and a third of 1 mL of dexamethasone 4 mg/mL.  She tolerated the injections well without any complications noted.  We will continue with her oral medications as well.  We will follow up with her on an as needed basis.    ___________________________  Reviewed and Electronically Signed By: Naoma DienerADAM S Saige Canton DPM  Sig Date: 07/02/2016  Sig Time: 20:39:48  Dictated By: Naoma DienerADAM S Tejah Brekke DPM  Dict Date: 07/02/2016 Dict Time: 04 59 PM    Dictation Date and Time:07/02/2016 16:59:09  Transcription Date and Time:07/02/2016 19:08:05  eScription Dictation id: [id number] Confirmation # :[id number]

## 2016-07-28 ENCOUNTER — Ambulatory Visit (HOSPITAL_BASED_OUTPATIENT_CLINIC_OR_DEPARTMENT_OTHER): Payer: PRIVATE HEALTH INSURANCE | Admitting: Podiatrist

## 2016-07-28 DIAGNOSIS — G9050 Complex regional pain syndrome I, unspecified: Secondary | ICD-10-CM

## 2016-07-28 MED ORDER — DEXAMETHASONE SODIUM PHOSPHATE 4 MG/ML IJ SOLN
2.00 mg | Freq: Once | INTRAMUSCULAR | 0 refills | Status: AC
Start: 2016-07-28 — End: 2016-07-28

## 2016-07-28 MED ORDER — DIAZEPAM 5 MG PO TABS
10.0000 mg | ORAL_TABLET | Freq: Two times a day (BID) | ORAL | 0 refills | Status: DC | PRN
Start: 2016-07-28 — End: 2016-08-25

## 2016-07-28 MED ORDER — METRONIDAZOLE 500 MG PO TABS: 500 mg | tablet | Freq: Two times a day (BID) | ORAL | 0 refills | 0 days | Status: AC

## 2016-07-28 MED ORDER — BUPIVACAINE LIPOSOME 1.3 % IJ SUSP
1.00 mL | Freq: Once | INTRAMUSCULAR | 0 refills | Status: AC
Start: 2016-07-28 — End: 2016-07-28

## 2016-07-28 MED ORDER — DEXAMETHASONE SODIUM PHOSPHATE 4 MG/ML IJ SOLN: 2 mg | mL | Freq: Once | INTRAMUSCULAR | 0 refills | 0 days | Status: AC

## 2016-07-28 MED ORDER — ZOLPIDEM TARTRATE 10 MG PO TABS
10.0000 mg | ORAL_TABLET | Freq: Every evening | ORAL | 0 refills | Status: DC | PRN
Start: 2016-07-28 — End: 2016-08-25

## 2016-07-28 MED ORDER — DIAZEPAM 5 MG PO TABS: 10 mg | tablet | Freq: Two times a day (BID) | ORAL | 0 refills | 0 days | Status: DC | PRN

## 2016-07-28 MED ORDER — BUPIVACAINE LIPOSOME 1.3 % IJ SUSP: 1 mL | mL | Freq: Once | 0 refills | 0 days | Status: AC

## 2016-07-28 MED ORDER — ZOLPIDEM TARTRATE 10 MG PO TABS: 10 mg | tablet | Freq: Every evening | ORAL | 0 refills | 0 days | Status: DC | PRN

## 2016-07-28 MED ORDER — METRONIDAZOLE 500 MG PO TABS
500.00 mg | ORAL_TABLET | Freq: Two times a day (BID) | ORAL | 0 refills | Status: AC
Start: 2016-07-28 — End: 2016-08-04

## 2016-07-28 NOTE — Progress Notes (Signed)
Date of Service: 07/28/2016    This is a 46 year old female patient who presents to the office today for ongoing care for reflex sympathetic dystrophy to her right foot and lower leg.  At her last visit, she had nerve blocks performed with Exparel.  This was approximately 4 weeks ago.  Since that time, she reports that she had complete relief until about 3 days ago.  She also has no open wounds.  She reports that she has recently been diagnosed with a yeast infection as well.    PAST MEDICAL HISTORY:  Reviewed and is otherwise unchanged.    Her foot is cold to the touch.  She has mottling on the distal tip of her first, second, and third toes all on the right side.  She has been continuing on with the other portions of treatment including intermittent use of Ambien and diazepam.    Based on clinical examination today, I recommended nerve block to the posterior tibial nerve, common peroneal nerve and an ankle block, all to the right side using a total of 15 mL of Exparel with 5 mL at each site.  I have also added a third of 1 mL of dexamethasone 4 mg/mL to each injection site.  Plan is for her to follow up with us on an as-needed basis.  She tolerated injections well without any complications noted.    ___________________________  Reviewed and Electronically Signed By: Naoma DienerADAM S Carlyle Mcelrath DPM  Sig Date: 07/28/2016  Sig Time: 15:50:37  Dictated By: Naoma DienerADAM S Earlean Fidalgo DPM  Dict Date: 07/28/2016 Dict Time: 01 44 PM    Dictation Date and Time:07/28/2016 13:44:11  Transcription Date and Time:07/28/2016 14:54:20  eScription Dictation id: [id number] Confirmation # :[id number]

## 2016-08-25 ENCOUNTER — Ambulatory Visit (HOSPITAL_BASED_OUTPATIENT_CLINIC_OR_DEPARTMENT_OTHER): Payer: PRIVATE HEALTH INSURANCE | Admitting: Podiatrist

## 2016-08-25 DIAGNOSIS — G9050 Complex regional pain syndrome I, unspecified: Secondary | ICD-10-CM

## 2016-08-25 MED ORDER — TRAMADOL HCL 50 MG PO TABS
50.0000 mg | ORAL_TABLET | Freq: Two times a day (BID) | ORAL | 0 refills | Status: AC | PRN
Start: 2016-08-25 — End: 2016-09-14

## 2016-08-25 MED ORDER — ZOLPIDEM TARTRATE 10 MG PO TABS
10.0000 mg | ORAL_TABLET | Freq: Every evening | ORAL | 0 refills | Status: DC | PRN
Start: 2016-08-25 — End: 2016-09-22

## 2016-08-25 MED ORDER — DIAZEPAM 5 MG PO TABS: 10 mg | tablet | Freq: Two times a day (BID) | ORAL | 0 refills | 0 days | Status: DC | PRN

## 2016-08-25 MED ORDER — TRAMADOL HCL 50 MG PO TABS: 50 mg | tablet | Freq: Two times a day (BID) | ORAL | 0 refills | 0 days | Status: AC | PRN

## 2016-08-25 MED ORDER — ZOLPIDEM TARTRATE 10 MG PO TABS: 10 mg | tablet | Freq: Every evening | ORAL | 0 refills | 0 days | Status: DC | PRN

## 2016-08-25 MED ORDER — DIAZEPAM 5 MG PO TABS
10.0000 mg | ORAL_TABLET | Freq: Two times a day (BID) | ORAL | 0 refills | Status: DC | PRN
Start: 2016-08-25 — End: 2016-09-22

## 2016-08-27 NOTE — Progress Notes (Signed)
Date of Service: 08/25/2016    This is a 46 year old female patient who returns to the clinic today for ongoing concerns of pain associated with reflex sympathetic dystrophy.  She reports over the last 2 or 3 nights she has been unable to sleep due to the severity of the pain.  Her foot is cool to touch and her skin is mottled, consistent with chronic reflex sympathetic dystrophy.  Previously she has had great success with nerve blocks performed with Exparel, and we will repeat those today.  Posterior tibial, common peroneal, and ankle blocks were all performed utilizing 5 mL of Exparel and 1/3 mL of dexamethasone 4 mg/mL in each site.  She tolerated the 3 nerve blocks well without any complications noted.  We renewed her prescriptions for diazepam and Ambien, and we will follow up with her on an as-needed basis.    ___________________________  Reviewed and Electronically Signed By: Naoma DienerADAM S Laporscha Linehan DPM  Sig Date: 08/28/2016  Sig Time: 10:39:56  Dictated By: Naoma DienerADAM S Areeb Corron DPM  Dict Date: 08/27/2016 Dict Time: 05 28 PM    Dictation Date and Time:08/27/2016 17:28:13  Transcription Date and Time:08/27/2016 18:13:05  eScription Dictation id: [id number] Confirmation # :[id number]

## 2016-08-29 MED ORDER — DEXAMETHASONE SODIUM PHOSPHATE 4 MG/ML IJ SOLN: 2 mg | mL | Freq: Once | INTRAMUSCULAR | 0 refills | 0 days | Status: AC

## 2016-08-29 MED ORDER — DEXAMETHASONE SODIUM PHOSPHATE 4 MG/ML IJ SOLN
2.00 mg | Freq: Once | INTRAMUSCULAR | 0 refills | Status: AC
Start: 2016-08-29 — End: 2016-08-29

## 2016-08-29 MED ORDER — BUPIVACAINE LIPOSOME 1.3 % IJ SUSP: 1 mL | mL | Freq: Once | 0 refills | 0 days | Status: AC

## 2016-08-29 MED ORDER — BUPIVACAINE LIPOSOME 1.3 % IJ SUSP
1.00 mL | Freq: Once | INTRAMUSCULAR | 0 refills | Status: AC
Start: 2016-08-29 — End: 2016-08-29

## 2016-08-29 NOTE — Progress Notes (Signed)
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

## 2016-09-17 ENCOUNTER — Other Ambulatory Visit (HOSPITAL_BASED_OUTPATIENT_CLINIC_OR_DEPARTMENT_OTHER): Payer: Self-pay | Admitting: Podiatrist

## 2016-09-17 MED ORDER — CLINDAMYCIN HCL 300 MG PO CAPS
300.00 mg | ORAL_CAPSULE | Freq: Three times a day (TID) | ORAL | 0 refills | Status: AC
Start: 2016-09-17 — End: 2016-09-22

## 2016-09-17 MED ORDER — CLINDAMYCIN HCL 300 MG PO CAPS: 300 mg | capsule | Freq: Three times a day (TID) | ORAL | 0 refills | 0 days | Status: AC

## 2016-09-22 ENCOUNTER — Ambulatory Visit (HOSPITAL_BASED_OUTPATIENT_CLINIC_OR_DEPARTMENT_OTHER): Payer: PRIVATE HEALTH INSURANCE | Admitting: Podiatrist

## 2016-09-22 DIAGNOSIS — G9050 Complex regional pain syndrome I, unspecified: Secondary | ICD-10-CM

## 2016-09-22 DIAGNOSIS — L03032 Cellulitis of left toe: Secondary | ICD-10-CM

## 2016-09-22 MED ORDER — ZOLPIDEM TARTRATE 10 MG PO TABS: 10 mg | tablet | Freq: Every evening | ORAL | 0 refills | 0 days | Status: DC | PRN

## 2016-09-22 MED ORDER — DIAZEPAM 5 MG PO TABS: 10 mg | tablet | Freq: Two times a day (BID) | ORAL | 0 refills | 0 days | Status: DC | PRN

## 2016-09-22 MED ORDER — ZOLPIDEM TARTRATE 10 MG PO TABS
10.0000 mg | ORAL_TABLET | Freq: Every evening | ORAL | 0 refills | Status: DC | PRN
Start: 2016-09-22 — End: 2016-10-27

## 2016-09-22 MED ORDER — DIAZEPAM 5 MG PO TABS
10.0000 mg | ORAL_TABLET | Freq: Two times a day (BID) | ORAL | 0 refills | Status: DC | PRN
Start: 2016-09-22 — End: 2016-11-24

## 2016-09-23 NOTE — Progress Notes (Signed)
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  She still is able to make it about 1 month before having symptoms, which recur for the RSD.    PAST MEDICAL HISTORY:  Reviewed and is unchanged.    REVIEW OF SYSTEMS:  She denies fever, chills, nausea, vomiting, night sweats, shortness of breath, and calf pain.    PHYSICAL EXAMINATION:  She has palpable DP and PT pulses bilaterally with obvious cooling of the right foot compared to left.  She has 5/5 muscle strength bilaterally.    ASSESSMENT AND PLAN:  Today we have performed a posterior tibial ankle and common peroneal blocks using 5 mL of Exparel and 1/3 of a mL of dexamethasone 4 mg/mL, to each of the 3 sites.  She tolerated the injection well without any complications noted.  We have also renewed her prescription for diazepam and zolpidem to help treat the stress associated with her reflex sympathetic dystrophy.  Overall, she reports some significant improvement once she gets the injection.  She also had a mild paronychia along the medial margin of the left hallux and the lateral margin of the right hallux.  Simple slant back procedures were performed in order to relieve this pain and we will follow up with her on an as needed basis.    ___________________________  Reviewed and Electronically Signed By: Naoma Diener DPM  Sig Date: 09/25/2016  Sig Time: 09:25:43  Dictated By: Naoma Diener DPM  Dict Date: 09/22/2016 Dict Time: 04 59 PM    Dictation Date and Time:09/22/2016 16:59:00  Transcription Date and Time:09/22/2016 17:31:42  eScription Dictation id: [id number] Confirmation # :[id number]

## 2016-09-25 MED ORDER — BUPIVACAINE LIPOSOME 1.3 % IJ SUSP
1.00 mL | Freq: Once | INTRAMUSCULAR | 0 refills | Status: AC
Start: 2016-09-25 — End: 2016-09-25

## 2016-09-25 MED ORDER — DEXAMETHASONE SODIUM PHOSPHATE 4 MG/ML IJ SOLN: 2 mg | mL | Freq: Once | INTRAMUSCULAR | 0 refills | 0 days | Status: AC

## 2016-09-25 MED ORDER — BUPIVACAINE LIPOSOME 1.3 % IJ SUSP: 1 mL | mL | Freq: Once | 0 refills | 0 days | Status: AC

## 2016-09-25 MED ORDER — DEXAMETHASONE SODIUM PHOSPHATE 4 MG/ML IJ SOLN
2.00 mg | Freq: Once | INTRAMUSCULAR | 0 refills | Status: AC
Start: 2016-09-25 — End: 2016-09-25

## 2016-09-25 NOTE — Addendum Note (Signed)
Addended byRosilyn Mings, Cosme Jacob on: 09/25/2016 05:40 PM     Modules accepted: Orders
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
## 2016-10-27 ENCOUNTER — Ambulatory Visit (HOSPITAL_BASED_OUTPATIENT_CLINIC_OR_DEPARTMENT_OTHER): Payer: PRIVATE HEALTH INSURANCE | Admitting: Podiatrist

## 2016-10-27 DIAGNOSIS — G9050 Complex regional pain syndrome I, unspecified: Secondary | ICD-10-CM

## 2016-10-27 MED ORDER — ZOLPIDEM TARTRATE 10 MG PO TABS: 10 mg | tablet | Freq: Every evening | ORAL | 0 refills | 0 days | Status: DC | PRN

## 2016-10-27 MED ORDER — CYCLOBENZAPRINE HCL 10 MG PO TABS
10.0000 mg | ORAL_TABLET | Freq: Three times a day (TID) | ORAL | 0 refills | Status: DC | PRN
Start: 2016-10-27 — End: 2016-11-24

## 2016-10-27 MED ORDER — ZOLPIDEM TARTRATE 10 MG PO TABS
10.0000 mg | ORAL_TABLET | Freq: Every evening | ORAL | 0 refills | Status: DC | PRN
Start: 2016-10-27 — End: 2016-11-24

## 2016-10-27 MED ORDER — CYCLOBENZAPRINE HCL 10 MG PO TABS: 10 mg | tablet | Freq: Three times a day (TID) | ORAL | 0 refills | 0 days | Status: DC | PRN

## 2016-10-27 MED ORDER — KETOROLAC TROMETHAMINE 10 MG PO TABS: 10 mg | tablet | Freq: Three times a day (TID) | ORAL | 0 refills | 0 days | Status: DC | PRN

## 2016-10-27 MED ORDER — KETOROLAC TROMETHAMINE 10 MG PO TABS
10.0000 mg | ORAL_TABLET | Freq: Three times a day (TID) | ORAL | 0 refills | Status: DC | PRN
Start: 2016-10-27 — End: 2016-12-20

## 2016-11-24 ENCOUNTER — Ambulatory Visit (HOSPITAL_BASED_OUTPATIENT_CLINIC_OR_DEPARTMENT_OTHER): Payer: PRIVATE HEALTH INSURANCE | Admitting: Podiatrist

## 2016-11-24 DIAGNOSIS — G90521 Complex regional pain syndrome I of right lower limb: Secondary | ICD-10-CM

## 2016-11-24 MED ORDER — DIAZEPAM 5 MG PO TABS: 10 mg | tablet | Freq: Two times a day (BID) | ORAL | 0 refills | 0 days | Status: DC | PRN

## 2016-11-24 MED ORDER — ZOLPIDEM TARTRATE 10 MG PO TABS
10.0000 mg | ORAL_TABLET | Freq: Every evening | ORAL | 0 refills | Status: DC | PRN
Start: 2016-11-24 — End: 2016-12-20

## 2016-11-24 MED ORDER — DIAZEPAM 5 MG PO TABS
10.0000 mg | ORAL_TABLET | Freq: Two times a day (BID) | ORAL | 0 refills | Status: DC | PRN
Start: 2016-11-24 — End: 2016-12-20

## 2016-11-24 MED ORDER — ZOLPIDEM TARTRATE 10 MG PO TABS: 10 mg | tablet | Freq: Every evening | ORAL | 0 refills | 0 days | Status: DC | PRN

## 2016-11-25 MED ORDER — BUPIVACAINE LIPOSOME 1.3 % IJ SUSP
1.00 mL | Freq: Once | INTRAMUSCULAR | 0 refills | Status: AC
Start: 2016-11-25 — End: 2016-11-25

## 2016-11-25 MED ORDER — BUPIVACAINE LIPOSOME 1.3 % IJ SUSP: 1 mL | mL | Freq: Once | 0 refills | 0 days | Status: AC

## 2016-11-25 MED ORDER — DEXAMETHASONE SODIUM PHOSPHATE 4 MG/ML IJ SOLN: 2 mg | mL | Freq: Once | INTRAMUSCULAR | 0 refills | 0 days | Status: AC

## 2016-11-25 MED ORDER — DEXAMETHASONE SODIUM PHOSPHATE 4 MG/ML IJ SOLN
2.00 mg | Freq: Once | INTRAMUSCULAR | 0 refills | Status: AC
Start: 2016-11-25 — End: 2016-11-25

## 2016-11-25 NOTE — Progress Notes (Signed)
Date of Service: 11/24/2016    SUBJECTIVE:  This is a 46 year old female patient who presents to the clinic today with ongoing concern of reflex sympathetic dystrophy secondary to a contusion to her foot several years ago.  She has had regional nerve blocks approximately every 3 to 4 weeks with some significant and lasting relief.  She presents today with severe pain.  She has a coolness to the touch of her right foot and lower leg and some mottling of the skin consistent with reflex sympathetic dystrophy.  She has a generalized and diffuse moderate to severe tenderness in the foot and is walking with an antalgic gait pattern.    OBJECTIVE:    PHYSICAL EXAMINATION:   EXTREMITIES:  She has palpable DP and PT pulses bilaterally.  Sensorium is described as hypersensitive on her right foot.  She has 5/5 muscle strength bilaterally.    PLAN:  We again reviewed treatment options with her.  She has the best result from 3 regional nerve blocks at the posterior tibial, common peroneal, and ankle block comes with each injection consisting of 5 mL of Exparel and 0.25 mL of dexamethasone 4 mg/mL.  The 3 nerve blocks were administered as described and she tolerated the injections well without any complications noted.  Previously, we recommended that she try Flexeril as an alternative to diazepam.  She did try that this month and noted that the relief is not nearly as good, so we will switch her back to diazepam today.  We also will renew her prescription for Ambien to help with sleeplessness.  This appears to be exacerbating her RSD.  Plan is for her to follow up with us on an as-needed basis.    ___________________________  Reviewed and Electronically Signed By: Naoma DienerADAM S Timithy Arons DPM  Sig Date: 11/26/2016  Sig Time: 12:02:16  Dictated By: Naoma DienerADAM S Vuk Skillern DPM  Dict Date: 11/25/2016 Dict Time: 09 13 AM    Dictation Date and Time:11/25/2016 09:13:29  Transcription Date and Time:11/25/2016 10:09:10  eScription Dictation id: [id number]  Confirmation # :[id number]

## 2016-11-25 NOTE — Progress Notes (Signed)
This is a 46 year old female patient who presents to the office today for ongoing care for reflex sympathetic dystrophy to her right foot and lower leg.  At her last visit, she had nerve blocks performed with Exparel.  This was approximately 4 weeks ago.  Since that time, she reports that she had complete relief until about 3 days ago.  She also has no open wounds.      PAST MEDICAL HISTORY:  Reviewed and is otherwise unchanged.    Her foot is cold to the touch.  She has mottling on the distal tip of her first, second, and third toes all on the right side.  She has been continuing on with the other portions of treatment including intermittent use of Ambien and diazepam.    Based on clinical examination today, I recommended nerve block to the posterior tibial nerve, common peroneal nerve and an ankle block, all to the right side using a total of 15 mL of Exparel with 5 mL at each site.  I have also added a third of 1 mL of dexamethasone 4 mg/mL to each injection site.  Plan is for her to follow up with us on an as-needed basis.  She tolerated injections well without any complications noted.

## 2016-11-28 MED ORDER — BUPIVACAINE LIPOSOME 1.3 % IJ SUSP
1.00 mL | Freq: Once | INTRAMUSCULAR | 0 refills | Status: AC
Start: 2016-11-28 — End: 2016-11-28

## 2016-11-28 MED ORDER — BUPIVACAINE LIPOSOME 1.3 % IJ SUSP: 1 mL | mL | Freq: Once | 0 refills | 0 days | Status: AC

## 2016-11-28 MED ORDER — DEXAMETHASONE SODIUM PHOSPHATE 4 MG/ML IJ SOLN: 2 mg | mL | Freq: Once | INTRAMUSCULAR | 0 refills | 0 days | Status: AC

## 2016-11-28 MED ORDER — DEXAMETHASONE SODIUM PHOSPHATE 4 MG/ML IJ SOLN
2.00 mg | Freq: Once | INTRAMUSCULAR | 0 refills | Status: AC
Start: 2016-11-28 — End: 2016-11-28

## 2016-12-20 ENCOUNTER — Ambulatory Visit (HOSPITAL_BASED_OUTPATIENT_CLINIC_OR_DEPARTMENT_OTHER): Payer: PRIVATE HEALTH INSURANCE | Admitting: Podiatrist

## 2016-12-20 DIAGNOSIS — R21 Rash and other nonspecific skin eruption: Secondary | ICD-10-CM

## 2016-12-20 DIAGNOSIS — G9050 Complex regional pain syndrome I, unspecified: Secondary | ICD-10-CM

## 2016-12-20 MED ORDER — METHYLPREDNISOLONE 4 MG PO TBPK
8.00 mg | ORAL_TABLET | ORAL | 0 refills | Status: AC
Start: 2016-12-20 — End: 2016-12-25

## 2016-12-20 MED ORDER — METHYLPREDNISOLONE 4 MG PO TBPK: 8 mg | tablet | ORAL | 0 refills | 0 days | Status: AC

## 2016-12-20 MED ORDER — ZOLPIDEM TARTRATE 10 MG PO TABS
10.0000 mg | ORAL_TABLET | Freq: Every evening | ORAL | 0 refills | Status: DC | PRN
Start: 2016-12-20 — End: 2017-01-24

## 2016-12-20 MED ORDER — DIAZEPAM 5 MG PO TABS
10.0000 mg | ORAL_TABLET | Freq: Two times a day (BID) | ORAL | 0 refills | Status: DC | PRN
Start: 2016-12-20 — End: 2017-01-24

## 2016-12-20 MED ORDER — DIAZEPAM 5 MG PO TABS: 10 mg | tablet | Freq: Two times a day (BID) | ORAL | 0 refills | 0 days | Status: DC | PRN

## 2016-12-20 MED ORDER — KETOROLAC TROMETHAMINE 10 MG PO TABS
10.0000 mg | ORAL_TABLET | Freq: Three times a day (TID) | ORAL | 0 refills | Status: DC | PRN
Start: 2016-12-20 — End: 2017-01-19

## 2016-12-20 MED ORDER — KETOROLAC TROMETHAMINE 10 MG PO TABS: 10 mg | tablet | Freq: Three times a day (TID) | ORAL | 0 refills | 0 days | Status: AC | PRN

## 2016-12-20 MED ORDER — ZOLPIDEM TARTRATE 10 MG PO TABS: 10 mg | tablet | Freq: Every evening | ORAL | 0 refills | 0 days | Status: DC | PRN

## 2016-12-20 NOTE — Progress Notes (Addendum)
46 year old female presents to clinic for on going concern of reflex sympathetic dystrophy secondary to a contusion on her right foot. The RSD has been ongoing for 7 years. She has been receiving regional nerve blocks every 3-4 weeks with significant and lasting relief. She relates that the last treatment 11/24/2016 provided her with significant pain relief. The pain returned about two days ago and is 8/10 on VAS today. She has been taking diazepam as needed for muscle spasms. She has been taking ambien as needed the past week when the pain disrupts her sleep. Her right foot and lower leg is cool to touch and with generalized diffuse tenderness, consistent with RSD.    Patient relates a rash that developed on the posterior aspect of both lower legs after running through tall grass last week. The area became red and developed blisters. She has tried applying over the counter cortisone cream but the rash is still present. The rash is red with a burning sensation.     PHYSICAL EXAM  Vascular: DP and PT pulses present bilaterally.   Neurologic: Hypersensitive right foot and lower leg   Dermatologic: Erythematous scaly patch distal posterior leg bilaterally.     A/P  #Reflex sympathetic dystrophy  Reviewed treatment options with patient. Patient elects to continue therapy with Exparel and dexamethasone since she had satisfactory outcomes with the last treatment. Regional nerve block administered to posterior tibial, common peroneal, and ankle nerves, each injection consisting of 5mL Expareal and 0.4625mL dexamethasone 4mg /dL. Patient tolerated the injections.   Patient is to continue with diazepam and Ambien as needed.     Discussed with patient experimental treatment of RSD with ketamine infusion. There is insufficient data to fully support its use but patient could elect to follow up at a pain management center for possible therapy since she has exhausted other treatment modalities during the past seven years.      #Contact Dermatitis   Prescribed Medrol dose pack 5 days for possible contact dermatitis. Patient is to follow up if the rash does not resolve.     Return to clinic as needed for RSD

## 2016-12-21 ENCOUNTER — Telehealth (HOSPITAL_BASED_OUTPATIENT_CLINIC_OR_DEPARTMENT_OTHER): Payer: Self-pay

## 2016-12-21 NOTE — Progress Notes (Signed)
Patient's pharmacy called regarding concern about 30 day supply of Ketolorac (10mg ). Pharmacist states she is not comfortable prescribing more than 5 days of Ketolorac at a time. I informed the pharmacist that we can dispense 5 day supply each time, and have the patient refill the prescription until all 30 day supply is filled.. Pharmacist agrees.     Dairl Ponderavid Zarea Diesing, DPM   Podiatry, PGY-3

## 2016-12-21 NOTE — Telephone Encounter (Signed)
-----   Message from Judye BosBetsie Rodriguez sent at 12/21/2016  3:21 PM EDT -----  Regarding: Re patient of Dr Rosilyn MingsLandsman, Pharmacy calling to verify med info   Contact: [phone]  [facility]    Madison BjornstadSusan L Moulton [id number], 46 year old, female, Telephone Information:  Home Phone      [phone]  Work Phone      Not on file.  Mobile          [phone]  Home Phone      [phone]      Patient's Preferred Pharmacy:     [facility] - [address] - [place], [place] - [address]  Phone: [phone] Fax: [phone]    [facility] - [address] - [place], [place] - [address]  Phone: [phone] Fax: [phone]    [facility] - [place], [place] - [id number]  Phone: [phone] Fax: [phone]    [facility] - [address] - , [place] - [address]  Phone: [phone] Fax: [phone]        Cleotis LemaCALL BACK NUMBER: [phone]  Best time to call back:   Cell phone:   Other phone:    Available times:    Patient's language of care: English    Patient does not need an interpreter.    Patient's PCP: Florestine AversMichelle Marie A. Onalee Huaavid, MD, MD    Person calling on behalf of patient: Mar[facility]Sarah [facility]   Calling want to verify medication information for ketorolac (TORADOL) 10 MG tablet

## 2016-12-24 MED ORDER — DEXAMETHASONE SODIUM PHOSPHATE 4 MG/ML IJ SOLN: 2 mg | mL | Freq: Once | INTRAMUSCULAR | 0 refills | 0 days | Status: AC

## 2016-12-24 MED ORDER — DEXAMETHASONE SODIUM PHOSPHATE 4 MG/ML IJ SOLN
2.00 mg | Freq: Once | INTRAMUSCULAR | 0 refills | Status: AC
Start: 2016-12-24 — End: 2016-12-24

## 2016-12-24 MED ORDER — BUPIVACAINE LIPOSOME 1.3 % IJ SUSP: 1 mL | mL | Freq: Once | 0 refills | 0 days | Status: AC

## 2016-12-24 MED ORDER — BUPIVACAINE LIPOSOME 1.3 % IJ SUSP
1.00 mL | Freq: Once | INTRAMUSCULAR | 0 refills | Status: AC
Start: 2016-12-24 — End: 2016-12-24

## 2017-01-24 ENCOUNTER — Ambulatory Visit (HOSPITAL_BASED_OUTPATIENT_CLINIC_OR_DEPARTMENT_OTHER): Payer: PRIVATE HEALTH INSURANCE | Admitting: Podiatrist

## 2017-01-24 DIAGNOSIS — G90521 Complex regional pain syndrome I of right lower limb: Secondary | ICD-10-CM

## 2017-01-24 MED ORDER — DIAZEPAM 5 MG PO TABS
10.0000 mg | ORAL_TABLET | Freq: Two times a day (BID) | ORAL | 0 refills | Status: AC | PRN
Start: 2017-01-24 — End: 2017-02-23

## 2017-01-24 MED ORDER — ZOLPIDEM TARTRATE 10 MG PO TABS: 10 mg | tablet | Freq: Every evening | ORAL | 0 refills | 0 days | Status: AC | PRN

## 2017-01-24 MED ORDER — DIAZEPAM 5 MG PO TABS: 10 mg | tablet | Freq: Two times a day (BID) | ORAL | 0 refills | 0 days | Status: AC | PRN

## 2017-01-24 MED ORDER — ZOLPIDEM TARTRATE 10 MG PO TABS
10.0000 mg | ORAL_TABLET | Freq: Every evening | ORAL | 0 refills | Status: AC | PRN
Start: 2017-01-24 — End: 2017-02-23

## 2017-01-24 NOTE — Progress Notes (Signed)
Dx:    HPI  Madison Mckay is a 46 year old  female who presents to clinic for continued management of reflex sympathetic dystrophy to the right lower extremity following crush injury to the right foot.  She has been receiving regional nerve blocks to the right foot and ankle as well as right common peroneal nerve with good relief for approximately 1 month duration each time.  She denies any recent issues with falling or dropfoot.  She has been up on her feet for the last 2 weeks more than usual and endorses significant amount of pain to her right foot with any motion or activity.    Review of Systems:  Endorses significant right foot pain with activity.  Denies any nausea, vomiting, fever, chills, shortness of breath or chest pain    OBJECTIVE:  General:. AAOx3. NAD. Pleasant  Vascular: DP and PT pulses palpable, +2/4 b/l. Capillary fill time brisk to digits 1-5 b/l. No increase in skin temperature gradient. No varicosities noted b/l.  Neurologic: Epicritic sensation intact b/l.  Dermatologic: Hypopigmentation of the right foot relative to the left foot starting at the level of the malleoli and extending distally.  Relative lack of hair growth noted to the right foot compared to the left.  Atrophic skin observed to the right foot.  Orthopedic: Muscle strength was +5/5 for all dorsiflexors, plantarflexors, invertors and everters of the left foot but patient is unable to perform muscle strength testing to the right foot due to significant amount of pain.    ASSESSMENT/PLAN:  Madison Mckay is a 46 year old female with ongoing reflex sympathetic dystrophy to the right lower extremity.  Based on the chronicity of this issue, we have sent her for 3 weightbearing views of the right foot to be taken on her way out from clinic today.  For today, 3 long-acting Exparel and dexamethasone injections (each consisting of 5cc of Exparel and 0.33cc of dexamethasone phosphate 4mg /mL) were administered in ankle block and common  peroneal block fashion. She will RTC in one month for further evaluation and possible injections.  Today, her Ambien and Valium prescriptions were also refilled at her request for management of chronic pain.

## 2017-02-19 MED ORDER — DEXAMETHASONE SODIUM PHOSPHATE 4 MG/ML IJ SOLN
2.00 mg | Freq: Once | INTRAMUSCULAR | 0 refills | Status: AC
Start: 2017-02-19 — End: 2017-02-19

## 2017-02-19 MED ORDER — BUPIVACAINE LIPOSOME 1.3 % IJ SUSP
1.00 mL | Freq: Once | INTRAMUSCULAR | 0 refills | Status: AC
Start: 2017-02-19 — End: 2017-02-19

## 2017-02-19 MED ORDER — DEXAMETHASONE SODIUM PHOSPHATE 4 MG/ML IJ SOLN: 2 mg | mL | Freq: Once | INTRAMUSCULAR | 0 refills | 0 days | Status: AC

## 2017-02-19 MED ORDER — BUPIVACAINE LIPOSOME 1.3 % IJ SUSP: 1 mL | mL | Freq: Once | 0 refills | 0 days | Status: AC

## 2017-02-19 NOTE — Progress Notes (Signed)
Patient was seen with resident Dr. Melick.  I concur with their assessment and treatment plan.  I was present during their examination, and all procedures were performed under my direct supervision, or by me personally.

## 2017-02-21 ENCOUNTER — Ambulatory Visit (HOSPITAL_BASED_OUTPATIENT_CLINIC_OR_DEPARTMENT_OTHER): Payer: PRIVATE HEALTH INSURANCE | Admitting: Podiatrist

## 2017-02-21 DIAGNOSIS — G9050 Complex regional pain syndrome I, unspecified: Secondary | ICD-10-CM

## 2017-02-21 MED ORDER — OXYCODONE HCL 5 MG PO TABS
5.00 mg | ORAL_TABLET | Freq: Two times a day (BID) | ORAL | 0 refills | Status: AC | PRN
Start: 2017-02-21 — End: 2017-02-28

## 2017-02-21 MED ORDER — DIAZEPAM 5 MG PO TABS
5.0000 mg | ORAL_TABLET | Freq: Two times a day (BID) | ORAL | 0 refills | Status: DC | PRN
Start: 2017-02-21 — End: 2017-03-21

## 2017-02-21 MED ORDER — ESZOPICLONE 2 MG PO TABS
2.0000 mg | ORAL_TABLET | Freq: Every evening | ORAL | 0 refills | Status: DC | PRN
Start: 2017-02-21 — End: 2017-03-21

## 2017-02-21 MED ORDER — OXYCODONE HCL 5 MG PO TABS: 5 mg | tablet | Freq: Two times a day (BID) | ORAL | 0 refills | 0 days | Status: AC | PRN

## 2017-02-21 MED ORDER — DIAZEPAM 5 MG PO TABS: 5 mg | tablet | Freq: Two times a day (BID) | ORAL | 0 refills | 0 days | Status: DC | PRN

## 2017-02-21 MED ORDER — ESZOPICLONE 2 MG PO TABS: 2 mg | tablet | Freq: Every evening | ORAL | 0 refills | 0 days | Status: DC | PRN

## 2017-02-21 NOTE — Progress Notes (Addendum)
HPI  Madison Mckay is a 46 year old  female who presents to clinic for continued management of reflex sympathetic dystrophy to the right lower extremity following crush injury to the right foot.  She has been receiving regional nerve blocks to the right foot and ankle as well as right common peroneal nerve with good relief for approximately 3-4 week duration each time.  She denies any recent issues with falling or dropfoot. She denies having the time to take the x-ray at the last office visit.    Review of Systems:  Endorses significant right foot pain with activity.  Denies any nausea, vomiting, fever, chills, shortness of breath or chest pain    OBJECTIVE:  General:. AAOx3. NAD. Pleasant  Vascular: DP and PT pulses palpable, +2/4 b/l. Capillary fill time brisk to digits 1-5 b/l. No increase in skin temperature gradient. No varicosities noted b/l.  Neurologic: Epicritic sensation intact b/l.  Dermatologic: Hypopigmentation of the right foot relative to the left foot starting at the level of the malleoli and extending distally.  Relative lack of hair growth noted to the right foot compared to the left.  Atrophic skin observed to the right foot.  Orthopedic: Muscle strength was +5/5 for all dorsiflexors, plantarflexors, invertors and everters of the left foot but patient is unable to perform muscle strength testing to the right foot due to significant amount of pain.    ASSESSMENT/PLAN:  Madison Mckay is a 46 year old female with ongoing reflex sympathetic dystrophy to the right lower extremity.  For today, 3 long-acting Exparel and dexamethasone injections (each consisting of 5cc of Exparel and 0.33cc of dexamethasone phosphate /mL) were administered in ankle block, posterior tibial nerve, and common peroneal block fashion. She will RTC in one month for further evaluation and possible injections.  She was dispensed a prescription for Lunesta, Diazapam and oxycodone at today's visit.  She will receive an x-ray  following today's visit.

## 2017-03-21 ENCOUNTER — Ambulatory Visit (HOSPITAL_BASED_OUTPATIENT_CLINIC_OR_DEPARTMENT_OTHER): Payer: PRIVATE HEALTH INSURANCE | Admitting: Podiatrist

## 2017-03-21 DIAGNOSIS — M79671 Pain in right foot: Secondary | ICD-10-CM

## 2017-03-21 DIAGNOSIS — G9050 Complex regional pain syndrome I, unspecified: Secondary | ICD-10-CM

## 2017-03-21 MED ORDER — GABAPENTIN 400 MG PO CAPS
400.0000 mg | ORAL_CAPSULE | Freq: Three times a day (TID) | ORAL | 0 refills | Status: DC
Start: 2017-03-21 — End: 2017-05-02

## 2017-03-21 MED ORDER — GABAPENTIN 400 MG PO CAPS: 400 mg | capsule | Freq: Three times a day (TID) | ORAL | 0 refills | 0 days | Status: DC

## 2017-03-21 MED ORDER — DIAZEPAM 5 MG PO TABS: 10 mg | tablet | Freq: Two times a day (BID) | ORAL | 0 refills | 0 days | Status: DC | PRN

## 2017-03-21 MED ORDER — ZOLPIDEM TARTRATE 10 MG PO TABS: 10 mg | tablet | Freq: Every evening | ORAL | 0 refills | 0 days | Status: DC | PRN

## 2017-03-21 MED ORDER — ZOLPIDEM TARTRATE 10 MG PO TABS
10.0000 mg | ORAL_TABLET | Freq: Every evening | ORAL | 0 refills | Status: DC | PRN
Start: 2017-03-21 — End: 2017-04-20

## 2017-03-21 MED ORDER — DIAZEPAM 5 MG PO TABS
10.0000 mg | ORAL_TABLET | Freq: Two times a day (BID) | ORAL | 0 refills | Status: DC | PRN
Start: 2017-03-21 — End: 2017-04-20

## 2017-04-10 MED ORDER — DEXAMETHASONE SODIUM PHOSPHATE 4 MG/ML IJ SOLN
2.00 mg | Freq: Once | INTRAMUSCULAR | 0 refills | Status: AC
Start: 2017-04-10 — End: 2017-04-10

## 2017-04-10 MED ORDER — BUPIVACAINE LIPOSOME 1.3 % IJ SUSP
1.00 mL | Freq: Once | INTRAMUSCULAR | 0 refills | Status: AC
Start: 2017-04-10 — End: 2017-04-10

## 2017-04-10 MED ORDER — BUPIVACAINE LIPOSOME 1.3 % IJ SUSP: 1 mL | mL | Freq: Once | 0 refills | 0 days | Status: AC

## 2017-04-10 MED ORDER — DEXAMETHASONE SODIUM PHOSPHATE 4 MG/ML IJ SOLN: 2 mg | mL | Freq: Once | INTRAMUSCULAR | 0 refills | 0 days | Status: AC

## 2017-04-20 ENCOUNTER — Ambulatory Visit (HOSPITAL_BASED_OUTPATIENT_CLINIC_OR_DEPARTMENT_OTHER): Payer: PRIVATE HEALTH INSURANCE | Admitting: Podiatrist

## 2017-04-20 DIAGNOSIS — G90521 Complex regional pain syndrome I of right lower limb: Secondary | ICD-10-CM

## 2017-04-20 MED ORDER — DIAZEPAM 5 MG PO TABS: tablet | ORAL | 0 refills | 0 days | Status: DC

## 2017-04-20 MED ORDER — ZOLPIDEM TARTRATE 10 MG PO TABS: 10 mg | tablet | Freq: Every evening | ORAL | 0 refills | 0 days | Status: DC | PRN

## 2017-04-20 MED ORDER — ZOLPIDEM TARTRATE 10 MG PO TABS
10.0000 mg | ORAL_TABLET | Freq: Every evening | ORAL | 0 refills | Status: DC | PRN
Start: 2017-04-20 — End: 2017-05-25

## 2017-04-20 MED ORDER — DIAZEPAM 5 MG PO TABS
ORAL_TABLET | ORAL | 0 refills | Status: DC
Start: 2017-04-20 — End: 2017-05-25

## 2017-04-20 NOTE — Progress Notes (Signed)
CC: Right foot pain    HPI: Madison Mckay is a 46 year old female who presents to clinic for continued management of her reflex sympathetic dystrophy of her right lower extremity.  Patient has RSD after suffering a crush injury to her right foot 7 years ago from a shelf that fell at her work.  She presents monthly for regional nerve blocks to her right foot and ankle as well as her common peroneal.  Patient states this is the only treatment that substantially alleviates her pain and have allowed her to return to her normal daily activities.  She presents today seeking another course of injections as the pain began to recur several days ago.  Numbness and tingling that radiates up her right leg.  No other complaints.    ROS: Patient denies fevers, chills, nausea, or vomiting.  Denies chest pain, shortness of breath.    PMH:  Patient Active Problem List:     Asthma     Contusion, foot     Neuritis     RSD (reflex sympathetic dystrophy)     Hypertension     Hypothyroidism     Right shoulder pain     Depression     HSV-1 (herpes simplex virus 1) infection     Raynaud's syndrome     Foot pain      Current Outpatient Medications on File Prior to Visit:  gabapentin (NEURONTIN) 400 MG capsule Take 1 capsule by mouth 3 (three) times daily Disp: 90 capsule Rfl: 0   [DISCONTINUED] diazepam (VALIUM) 5 MG tablet Take 2 tablets by mouth every 12 (twelve) hours as needed for Anxiety Take 1-2 tablets daily as needed for anxiety. Disp: 60 tablet Rfl: 0   [DISCONTINUED] zolpidem (AMBIEN) 10 MG TABS Take 1 tablet by mouth nightly as needed Disp: 30 tablet Rfl: 0   ketorolac (TORADOL) 10 MG tablet Take 1 tablet by mouth 3 (three) times daily as needed for Pain Disp: 90 tablet Rfl: 0   amLODIPine (NORVASC) 10 MG tablet Take 10 mg by mouth daily Disp:  Rfl:    lidocaine (LIDODERM) 5 % patch Place 1 patch onto the skin daily for 335 days (Patient taking differently: Place 1 patch onto the skin as needed ) Disp: 30 patch Rfl: 4    SYNTHROID 125 MCG tablet Take 1 tablet by mouth every morning. Disp: 90 tablet Rfl: 0   hydrochlorothiazide (HYDRODIURIL) 25 MG tablet Take 1 tablet by mouth daily. Disp: 90 tablet Rfl: 4   naproxen (NAPROSYN) 375 MG tablet Take 1 tablet by mouth 2 (two) times daily with meals. Disp: 28 tablet Rfl: 1   Diclofenac Sodium 1 % GEL Gel Apply  topically. Do Not Exceed 32 g in 24 Hours Disp:  Rfl:    Tapentadol HCl (NUCYNTA) 150 MG TB12 Take 1 tablet by mouth 4 (four) times daily. Disp: 84 tablet Rfl: 0   albuterol (PROAIR HFA) 108 (90 BASE) MCG/ACT inhaler Inhale 2 puffs into the lungs every 6 (six) hours as needed for Wheezing. Disp: 1 Inhaler Rfl: 11   fluticasone-salmeterol (ADVAIR DISKUS) 500-50 MCG/DOSE AEPB Aerosol Powder Inhale 1 puff into the lungs every 12 (twelve) hours. Disp: 180 each Rfl: 0   EPINEPHrine (EPIPEN 2-PAK) 0.3 MG/0.3ML Injection Device Inject 0.3 mg into the muscle once as needed. Disp: 1 each Rfl: 1   clindamycin-benzoyl peroxide (BENZACLIN) gel Apply  topically 2 (two) times daily. Disp: 50 g Rfl: 4     Current Facility-Administered Medications  on File Prior to Visit:  bupivacaine liposomal (EXPAREL) injection 1-20 mL 1-20 mL Infiltration Once Adam Landsman   bupivacaine liposomal (EXPAREL) injection 1-20 mL 1-20 mL Infiltration Once Adam Landsman   bupivacaine liposomal (EXPAREL) injection 1-20 mL 1-20 mL Infiltration Once [facility] Landsman     Review of Patient's Allergies indicates:   Oxycodone                   Comment:Hives, other narcotics OK   Penicillins             Hives   Erythromycin            Nausea Only    Comment:Stomach pain severe no nausea, can take             azithromycin   Fluoxetine              Rash  Past Surgical History:  No date: ACL REPAIR      Comment:  left 2004, cadavaric acl  No date: OB ANTEPARTUM CARE CESAREAN DLVR & POSTPARTUM      Comment:  x2, 97 and 98  No date: ROTATOR CUFF REPAIR      Comment:  Labral repair anchor not sure if plastic or metal but                 had MRI with this in place without problems, on right                shoulder  No date: TOTAL ABDOMINAL HYSTERECT W/WO RMVL TUBE OVARY      Comment:  just TAH no ovary removal, 2005; laparascopies x 2                before TAH for endometriosis      OBJECTIVE:    There were no vitals filed for this visit.    PHYSICAL EXAM:    Gen: Patient is well-developed, well-nourished, no acute distress.    LOWER EXTREMITY:  Derm: No open wounds or lesions.  No rashes, ecchymosis, or erythema.  Interdigital spaces are clean, dry, and intact.  Skin temperature of the foot is mildly cooler to touch compared to contralateral limb.  There is also slight hypopigmentation and relative lack of hair growth of the right foot compared to the contralateral limb.  Vascular: Exhibits a warm and well perfused right foot with distal pulses palpable.  Cap refill is brisk to digits.  No pedal edema.  Neuro: Light touch sensation intact bilaterally.  There is hyperesthesia along the L4, L5 and S1 dermatomes.    M/S:Muscle strength was +5/5 for all dorsiflexors, plantarflexors, invertors and everters of the left foot but patient is unable to perform muscle strength testing to the right foot due to pain.     ASSESSMENT:  RSD of the right lower extremity    PLAN:  Madison Mckay is a 46 year old female with ongoing reflex sympathetic dystrophy to the right lower extremity.  For today, 3 long-acting Exparel and dexamethasone injections (each consisting of 5cc of Exparel and 0.33cc of dexamethasone phosphate 4mg /mL) were administered in ankle block and common peroneal block fashion. Patient tolerated the procedure well.  She will RTC in one month for further evaluation and possible injections.  Today, her Ambien and Valium prescriptions were also refilled at her request for management of chronic pain.

## 2017-04-25 ENCOUNTER — Other Ambulatory Visit (HOSPITAL_BASED_OUTPATIENT_CLINIC_OR_DEPARTMENT_OTHER): Payer: Self-pay | Admitting: Podiatrist

## 2017-04-25 ENCOUNTER — Ambulatory Visit (HOSPITAL_BASED_OUTPATIENT_CLINIC_OR_DEPARTMENT_OTHER): Payer: PRIVATE HEALTH INSURANCE | Admitting: Podiatrist

## 2017-04-25 ENCOUNTER — Ambulatory Visit (HOSPITAL_BASED_OUTPATIENT_CLINIC_OR_DEPARTMENT_OTHER): Payer: Self-pay | Admitting: Podiatrist

## 2017-04-25 DIAGNOSIS — G8929 Other chronic pain: Secondary | ICD-10-CM

## 2017-04-25 DIAGNOSIS — G9050 Complex regional pain syndrome I, unspecified: Secondary | ICD-10-CM

## 2017-04-25 DIAGNOSIS — M79671 Pain in right foot: Principal | ICD-10-CM

## 2017-04-25 LAB — XR FOOT RIGHT MINIMUM 3 VIEWS

## 2017-04-25 MED ORDER — PREDNISONE 10 MG PO TABS
ORAL_TABLET | ORAL | 0 refills | Status: AC
Start: 2017-04-25 — End: 2017-05-06

## 2017-04-25 MED ORDER — OXYCODONE-ACETAMINOPHEN 5-325 MG PO TABS
1.00 | ORAL_TABLET | Freq: Four times a day (QID) | ORAL | 0 refills | Status: AC | PRN
Start: 2017-04-25 — End: 2017-05-02

## 2017-04-25 MED ORDER — PREDNISONE 10 MG PO TABS: tablet | ORAL | 0 refills | 0 days | Status: AC

## 2017-04-25 MED ORDER — OXYCODONE-ACETAMINOPHEN 5-325 MG PO TABS: 1 | tablet | Freq: Four times a day (QID) | ORAL | 0 refills | 0 days | Status: AC | PRN

## 2017-04-25 NOTE — Progress Notes (Signed)
Patient was seen with resident Dr. Ang.  I concur with their assessment and treatment plan.  I was present during their examination, and all procedures were performed under my direct supervision, or by me personally.      We spent 15 minutes with this patient, and most of this time was used to discuss treatment options.

## 2017-04-25 NOTE — Addendum Note (Signed)
Addended by: MACKLIN, LISA on: 04/25/2017 01:20 PM     Modules accepted: Orders

## 2017-04-25 NOTE — Progress Notes (Signed)
Subjective:    Madison Mckay is a 46 year old female with history of RSD who presents to the office for urgent evaluation of right forefoot pain.  The patient did receive a regional block to the right lower extremity on 04/20/2017, and subsequently went for a run.  She states that she then had significant forefoot pain, and felt as if the block did not help at all.  She did obtain x-rays of the right lower extremity secondary to the acute nature of discomfort, and would like to review the x-rays with Dr. Rosilyn MingsLandsman today.    ROS: Endorses right foot pain, numbness and tingling.     Objective:  General: Patient is in mild distress, stating significant pain to the right foot.     Lower Extremity Exam  Vascular: Dorsalis pedis and posterior tibial pulses are palpable on the right foot. The toes are mildly cool to the touch. In dependent position, shiny appearance to the right foot with mild hypopigmentation. No pedal edema noted.   Neuro: Hypersensitivity to digits 2-5 on the right foot. Mulder's negative to the third interspace on the right foot.   Derm: Skin is intact, without open lesions or interdigital macerations.   Ortho: Tenderness to palpation of the second, third, fourth and fifth metatarsophalangeal joints, and along the shafts of the corresponding metatarsals. Muscle strength remains intact, with no notable fat atrophy.     X-rays:  X-rays of the right lower extremity were reviewed today. All osseous structures were noted to be intact, without any cortical lucency. No spotty osteopenia appreciated. No increase in soft tissue contour.     Assessment:  Acute right foot pain     Plan:  X-rays of the right lower extremity were reviewed with the patient today.  In comparison to previous x-rays from 2016 and 2014, no significant osseous changes were appreciated.  No areas of cortical lucency to suggest a fracture are appreciated.  There are also no areas of spotty osteopenia.  Secondary to the focal forefoot  nature of the patient's pain, we explained to her that it did not make sense to provide an additional right lower extremity block.  If it was truly the block that was not working, she would have pain diffusely to the right foot.  However, she exhibits a more acute muscular skeletal type injury.  Today, she was prescribed a prednisone taper and provided with an immobilization boot for ambulation.  A short course of Percocet was also prescribed.  She will return to clinic in 1 week for further evaluation.    In accordance with the opiate bill enacted on 08/19/14, the patient's condition is such that a non-opiate medication was not appropriate and:  1. The treatment condition associated with the prescribing of an opiate/opioid medication is due to the addiction treatment, acute condition, chronic pain, cancer, or palliative care.  2. I have discussed with the parent or guardian of the minor the risks associated with opiate use and the reasons why the prescription is necessary.  3. I have evaluated the patients current condition, risk factors, history of substance abuse, if any, and current medications  4. I have informed the patient that the prescribed medication is an appropriate course of treatment based on the medical need of the patient.   5. I have consulted with the patient regarding the quantity of the opiate prescribed and informed the patients of her right to fill the prescription in a lesser quantity.  6. I have expressly informed  the patient of the risks associated with the opiate prescribed.  7. I have checked the Prescription Monitoring Program for this patient.      This note was created using dictation software. Please excuse any errors in spelling, grammar or syntax. Thank you for your understanding.

## 2017-05-02 ENCOUNTER — Ambulatory Visit (HOSPITAL_BASED_OUTPATIENT_CLINIC_OR_DEPARTMENT_OTHER): Payer: PRIVATE HEALTH INSURANCE | Admitting: Podiatrist

## 2017-05-02 DIAGNOSIS — G9050 Complex regional pain syndrome I, unspecified: Secondary | ICD-10-CM

## 2017-05-02 DIAGNOSIS — G5761 Lesion of plantar nerve, right lower limb: Secondary | ICD-10-CM

## 2017-05-02 MED ORDER — GABAPENTIN 300 MG PO CAPS: 300 mg | capsule | Freq: Two times a day (BID) | ORAL | 2 refills | 0 days | Status: AC

## 2017-05-02 MED ORDER — GABAPENTIN 300 MG PO CAPS
300.00 mg | ORAL_CAPSULE | Freq: Two times a day (BID) | ORAL | 2 refills | Status: AC
Start: 2017-05-02 — End: 2017-07-31

## 2017-05-02 NOTE — Progress Notes (Addendum)
CC: Right foot 3rd intermetatarsal space pain and reflex sympathetic dystrophy treatment    HPI:   46 year old female patient presents to clinic 1 week f/u for treatment of Morton's neuroma. She was last seen in clinic on 04/25/2017, and was given a short leg CAM boot and a prednisone taper to alleviate painful right foot neuroma symptoms. She reports mild improvement but not to the level of complete resolution of symptoms. She was given a short course of percocet on 11/19, and was given a right lower extremity block on 04/20/2017. She is having recurrent pain on the plantar and dorsal aspect of the right foot, and would like for another Exparel block.      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 Disp: 60 tablet Rfl: 0   zolpidem (AMBIEN) 10 MG TABS Take 1 tablet by mouth nightly as needed Disp: 30 tablet Rfl: 0   [DISCONTINUED] gabapentin (NEURONTIN) 400 MG capsule Take 1 capsule by mouth 3 (three) times daily Disp: 90 capsule Rfl: 0   ketorolac (TORADOL) 10 MG tablet Take 1 tablet by mouth 3 (three) times daily as  needed for Pain Disp: 90 tablet Rfl: 0   amLODIPine (NORVASC) 10 MG tablet Take 10 mg by mouth daily Disp:  Rfl:    lidocaine (LIDODERM) 5 % patch Place 1 patch onto the skin daily for 335 days (Patient taking differently: Place 1 patch onto the skin as needed ) Disp: 30 patch Rfl: 4   SYNTHROID 125 MCG tablet Take 1 tablet by mouth every morning. Disp: 90 tablet Rfl: 0   hydrochlorothiazide (HYDRODIURIL) 25 MG tablet Take 1 tablet by mouth daily. Disp: 90 tablet Rfl: 4   naproxen (NAPROSYN) 375 MG tablet Take 1 tablet by mouth 2 (two) times daily with meals. Disp: 28 tablet Rfl: 1   Diclofenac Sodium 1 % GEL Gel Apply  topically. Do Not Exceed 32 g in 24 Hours Disp:  Rfl:    Tapentadol HCl (NUCYNTA) 150 MG TB12 Take 1 tablet by mouth 4 (four) times daily. Disp: 84 tablet Rfl: 0   albuterol (PROAIR HFA) 108 (90 BASE) MCG/ACT inhaler Inhale 2 puffs into the lungs every 6 (six) hours as needed for Wheezing. Disp: 1 Inhaler Rfl: 11   fluticasone-salmeterol (ADVAIR DISKUS) 500-50 MCG/DOSE AEPB Aerosol Powder Inhale 1  puff into the lungs every 12 (twelve) hours. Disp: 180 each Rfl: 0   EPINEPHrine (EPIPEN 2-PAK) 0.3 MG/0.3ML Injection Device Inject 0.3 mg into the muscle once as needed. Disp: 1 each Rfl: 1   clindamycin-benzoyl peroxide (BENZACLIN) gel Apply  topically 2 (two) times daily. Disp: 50 g Rfl: 4     Current Facility-Administered Medications on File Prior to Visit:  bupivacaine liposomal (EXPAREL) injection 1-20 mL 1-20 mL Infiltration Once Luis Nickles   bupivacaine liposomal (EXPAREL) injection 1-20 mL 1-20 mL Infiltration Once Mandisa Persinger   bupivacaine liposomal (EXPAREL) injection 1-20 mL 1-20 mL Infiltration Once [facility]     Review of Patient's Allergies indicates:   Oxycodone                   Comment:Hives, other narcotics OK   Penicillins             Hives   Erythromycin            Nausea Only    Comment:Stomach pain severe no nausea, can take             azithromycin   Fluoxetine               Rash  Past Surgical History:  No date: ACL REPAIR      Comment:  left 2004, cadavaric acl  No date: OB ANTEPARTUM CARE CESAREAN DLVR & POSTPARTUM      Comment:  x2, 97 and 98  No date: ROTATOR CUFF REPAIR      Comment:  Labral repair anchor not sure if plastic or metal but                had MRI with this in place without problems, on right                shoulder  No date: TOTAL ABDOMINAL HYSTERECT W/WO RMVL TUBE OVARY      Comment:  just TAH no ovary removal, 2005; laparascopies x 2                before TAH for endometriosis  @SOC1 @  Social History    Tobacco Use      Smoking status: Never Smoker      Smokeless tobacco: Never Used       Alcohol use No     Family History   Problem Relation Age of Onset    Heart Disease Father         quadruple bypass 50     Heart Mother         atrial fibrillation    GI Mother         bowel resection not sure why    Endocrine Brother         hypothyroidism    Psychiatric Illness Son         adhd, pdd    No Known Family History Daughter     Heart Disease Maternal Grandfather         clogged arterities    Pulmonary Paternal Grandfather         emphysema       There were no vitals filed for this visit.  @VX @, @WTENG @, @BMI1 @    Lower Extremity Physical Exam:      General: A&O x 3, NAD     Vascular:   - DP and PT palpable 2+ bilaterally.  - Capillary refill brisk and less than 3 seconds bilaterally.  - Digital hair  present bilaterally.  - No increase in skin temperature gradient bilaterally.  - No varicosities noted bilaterally.    Neuro:   - Hypersensitivity to digits 2-5 on the right foot.    Musk:   - Pain upon right foot side to side compression and concomitant palpation of the Right 3rd intermetatarsal space. Negative mulder's click.  - 5/5 muscle strength in all 4 quadrants bilaterally.   - Foot compartments are supple.  - No gross deformity noted.     Derm:  - Right foot hallux skin has a blue discoloration distally.  - Skin and interdigital spaces are clean, dry, and  intact bilaterally with no open lesions or hyperkeratotic lesions bilaterally.      Assessment:   46 year old female patient with Morton's neuroma of Right 3rd intermetatarsal space and Reflex sympathetic dystrophy.     Plan:    Patient was given a corticosteroid injection into the Right 3rd intermetatarsal space for treatment of painful symptoms of a Morton's neuroma. Corticosteroid injection consisted of 0.5cc Kenalog 10, 0.5cc Dexamethasone, and 1cc lidocaine 1% plain. Patient tolerated injection well. She was also given a series of three 5cc Exparel liposomal bupivacaine injections with 0.33cc dexamethasone added to each into the Right foot posterior tibial nerve, ankle, and common peroneal nerve. This procedure was tolerated well. Her gabapentin was lowered to 300mg  BID as she was experiencing negative side effects with her previously prescribed 400mg  three times daily.

## 2017-05-02 NOTE — Progress Notes (Signed)
Date of Service: 03/21/2017    SUBJECTIVE:  This is a 46 year old female patient who presents to the clinic today for followup for reflex sympathetic dystrophy in her right foot and lower leg today.  She has red splotches and bluish discoloration of toes consistent with reflex sympathetic dystrophy.    PAST MEDICAL HISTORY:  Reviewed.      ASSESSMENT AND PLAN:  She continues to have moderate-to-severe pain in her right foot and lower leg.  She continues to have difficulty with sleeping as well and has chronic anxiety as a result of her severe symptoms.  Treatment today consisted of Exparel injections with 5 mL of Exparel and a third mL of dexamethasone 4 mg/mL, administered to the posterior tibial nerve and the common peroneal nerve and an ankle block distally.  She tolerated the injection well without any complications noted.  We renewed her prescription for diazepam, gabapentin, and Ambien.  We will follow up with her on a p.r.n. basis.    ___________________________  Reviewed and Electronically Signed By: Naoma DienerADAM S Brasen Bundren DPM  Sig Date: 05/03/2017  Sig Time: 10:20:47  Dictated By: Naoma DienerADAM S Dirck Butch DPM  Dict Date: 05/02/2017 Dict Time: 11 20 AM    Dictation Date and Time:05/02/2017 11:20:10  Transcription Date and Time:05/02/2017 12:03:45  eScription Dictation id: [id number] Confirmation # :[id number]

## 2017-05-09 ENCOUNTER — Other Ambulatory Visit (HOSPITAL_BASED_OUTPATIENT_CLINIC_OR_DEPARTMENT_OTHER): Payer: Self-pay | Admitting: Podiatrist

## 2017-05-09 DIAGNOSIS — B351 Tinea unguium: Secondary | ICD-10-CM

## 2017-05-09 MED ORDER — METRONIDAZOLE 1 % EX GEL
Freq: Every day | CUTANEOUS | 0 refills | Status: AC
Start: 2017-05-09 — End: 2017-06-08

## 2017-05-09 MED ORDER — METRONIDAZOLE 1 % EX GEL: g | Freq: Every day | CUTANEOUS | 0 refills | 0 days | Status: AC

## 2017-05-10 MED ORDER — DEXAMETHASONE SODIUM PHOSPHATE 4 MG/ML IJ SOLN
2.00 mg | Freq: Once | INTRAMUSCULAR | 0 refills | Status: AC
Start: 2017-05-10 — End: 2017-05-10

## 2017-05-10 MED ORDER — BUPIVACAINE LIPOSOME 1.3 % IJ SUSP
1.00 mL | Freq: Once | INTRAMUSCULAR | 0 refills | Status: AC
Start: 2017-05-10 — End: 2017-05-10

## 2017-05-10 MED ORDER — BUPIVACAINE LIPOSOME 1.3 % IJ SUSP: 1 mL | mL | Freq: Once | 0 refills | 0 days | Status: AC

## 2017-05-10 MED ORDER — DEXAMETHASONE SODIUM PHOSPHATE 4 MG/ML IJ SOLN: 2 mg | mL | Freq: Once | INTRAMUSCULAR | 0 refills | 0 days | Status: AC

## 2017-05-24 MED ORDER — BUPIVACAINE LIPOSOME 1.3 % IJ SUSP: 1 mL | mL | Freq: Once | 0 refills | 0 days | Status: AC

## 2017-05-24 MED ORDER — DEXAMETHASONE SODIUM PHOSPHATE 4 MG/ML IJ SOLN
2.00 mg | Freq: Once | INTRAMUSCULAR | 0 refills | Status: AC
Start: 2017-05-24 — End: 2017-05-24

## 2017-05-24 MED ORDER — DEXAMETHASONE SODIUM PHOSPHATE 4 MG/ML IJ SOLN: 2 mg | mL | Freq: Once | INTRAMUSCULAR | 0 refills | 0 days | Status: AC

## 2017-05-24 MED ORDER — BUPIVACAINE LIPOSOME 1.3 % IJ SUSP
1.00 mL | Freq: Once | INTRAMUSCULAR | 0 refills | Status: AC
Start: 2017-05-24 — End: 2017-05-24

## 2017-05-24 NOTE — Progress Notes (Signed)
Patient was seen with resident Dr. Jobe GibbonPundu.  I concur with their assessment and treatment plan.  I was present during their examination, and all procedures were performed under my direct supervision, or by me personally.    Blocks administered to posterior tibial, common peroneal and ankle block.  She tolerated injections without any complications.

## 2017-05-25 ENCOUNTER — Ambulatory Visit (HOSPITAL_BASED_OUTPATIENT_CLINIC_OR_DEPARTMENT_OTHER): Payer: PRIVATE HEALTH INSURANCE | Admitting: Podiatrist

## 2017-05-25 DIAGNOSIS — G9050 Complex regional pain syndrome I, unspecified: Secondary | ICD-10-CM

## 2017-05-25 MED ORDER — DIAZEPAM 5 MG PO TABS: tablet | ORAL | 0 refills | 0 days | Status: DC

## 2017-05-25 MED ORDER — ZOLPIDEM TARTRATE 10 MG PO TABS
10.0000 mg | ORAL_TABLET | Freq: Every evening | ORAL | 0 refills | Status: DC | PRN
Start: 2017-05-25 — End: 2017-06-24

## 2017-05-25 MED ORDER — ZOLPIDEM TARTRATE 10 MG PO TABS: 10 mg | tablet | Freq: Every evening | ORAL | 0 refills | 0 days | Status: DC | PRN

## 2017-05-25 MED ORDER — DIAZEPAM 5 MG PO TABS
ORAL_TABLET | ORAL | 0 refills | Status: DC
Start: 2017-05-25 — End: 2017-06-24

## 2017-05-25 NOTE — Progress Notes (Signed)
Date of Service: 05/25/2017    SUBJECTIVE:  This is a 46 year old female patient who returns to the clinic today with ongoing problems with chronic pain in her left foot and lower leg.  She has previously been diagnosed with reflex sympathetic dystrophy following a contusion at work many years ago.  Since that time, she has been requiring medication to help with sleep, medication for sedation and also serial injections using Exparel.  She usually gets about 1 month of relief with the Exparel injections.    OBJECTIVE:  Clinical examination today reveals no evidence of new contusions or breaks in the skin.  Her foot is ice cold from the mid foot down to the tips of the toes.  She reports that she notices some slight worsening of symptoms when she exercises.  We did discuss the possibility of an exertional compartment syndrome.  However, her symptoms are not consistent with this.  She has a normal palpable DP and PT pulses.  She does not get radiating numbness and she does not get unrelenting pain specifically associated with running or immediately following running.  She does not lose any muscle strength either.  Today she is noticeably upset.  Apparently, she separated from her husband late last night and has been staying with a friend.    PLAN:  Treatment today consisted of renewal of her normal Ambien and Valium  medications as well as administration of posterior tibial nerve block, common peroneal nerve block and anterior ankle nerve block.  She tolerated the 3 blocks well without any complications noted.  Each injection consisted of 5 mL of Exparel and a third of a mL of dexamethasone.  Plan is for her to follow up with us on an as needed basis.    ___________________________  Reviewed and Electronically Signed By: Naoma DienerADAM S Nikeya Maxim DPM  Sig Date: 05/26/2017  Sig Time: 14:23:08  Dictated By: Naoma DienerADAM S Phil Michels DPM  Dict Date: 05/25/2017 Dict Time: 03 59 PM    Dictation Date and Time:05/25/2017 15:59:07  Transcription  Date and Time:05/25/2017 16:45:25  eScription Dictation id: [id number] Confirmation # :[id number]

## 2017-05-26 MED ORDER — DEXAMETHASONE SODIUM PHOSPHATE 4 MG/ML IJ SOLN: 2 mg | mL | Freq: Once | INTRAMUSCULAR | 0 refills | 0 days | Status: AC

## 2017-05-26 MED ORDER — DEXAMETHASONE SODIUM PHOSPHATE 4 MG/ML IJ SOLN
2.00 mg | Freq: Once | INTRAMUSCULAR | 0 refills | Status: AC
Start: 2017-05-26 — End: 2017-05-26

## 2017-05-26 MED ORDER — BUPIVACAINE LIPOSOME 1.3 % IJ SUSP
1.00 mL | Freq: Once | INTRAMUSCULAR | 0 refills | Status: AC
Start: 2017-05-26 — End: 2017-05-26

## 2017-05-26 MED ORDER — BUPIVACAINE LIPOSOME 1.3 % IJ SUSP: 1 mL | mL | Freq: Once | 0 refills | 0 days | Status: AC

## 2017-06-01 MED ORDER — DEXAMETHASONE SODIUM PHOSPHATE 4 MG/ML IJ SOLN
2.00 mg | Freq: Once | INTRAMUSCULAR | 0 refills | Status: AC
Start: 2017-06-01 — End: 2017-06-01

## 2017-06-01 MED ORDER — BUPIVACAINE LIPOSOME 1.3 % IJ SUSP: 1 mL | mL | Freq: Once | 0 refills | 0 days | Status: AC

## 2017-06-01 MED ORDER — DEXAMETHASONE SODIUM PHOSPHATE 4 MG/ML IJ SOLN: 2 mg | mL | Freq: Once | INTRAMUSCULAR | 0 refills | 0 days | Status: AC

## 2017-06-01 MED ORDER — BUPIVACAINE LIPOSOME 1.3 % IJ SUSP
1.00 mL | Freq: Once | INTRAMUSCULAR | 0 refills | Status: AC
Start: 2017-06-01 — End: 2017-06-01

## 2017-06-24 ENCOUNTER — Ambulatory Visit (HOSPITAL_BASED_OUTPATIENT_CLINIC_OR_DEPARTMENT_OTHER): Payer: PRIVATE HEALTH INSURANCE | Admitting: Podiatrist

## 2017-06-24 DIAGNOSIS — G9050 Complex regional pain syndrome I, unspecified: Secondary | ICD-10-CM

## 2017-06-24 MED ORDER — OXYCODONE HCL ER 10 MG PO T12A: 10 mg | tablet | Freq: Two times a day (BID) | ORAL | 0 refills | 0 days | Status: DC

## 2017-06-24 MED ORDER — OXYCODONE HCL ER 10 MG PO T12A
10.0000 mg | EXTENDED_RELEASE_TABLET | Freq: Two times a day (BID) | ORAL | 0 refills | Status: DC
Start: 2017-06-24 — End: 2017-08-15

## 2017-06-24 MED ORDER — DIAZEPAM 5 MG PO TABS: tablet | ORAL | 0 refills | 0 days | Status: DC

## 2017-06-24 MED ORDER — ZOLPIDEM TARTRATE 10 MG PO TABS
10.0000 mg | ORAL_TABLET | Freq: Every evening | ORAL | 0 refills | Status: DC | PRN
Start: 2017-06-24 — End: 2017-08-15

## 2017-06-24 MED ORDER — DIAZEPAM 5 MG PO TABS
ORAL_TABLET | ORAL | 0 refills | Status: DC
Start: 2017-06-24 — End: 2017-08-15

## 2017-06-24 MED ORDER — ZOLPIDEM TARTRATE 10 MG PO TABS: 10 mg | tablet | Freq: Every evening | ORAL | 0 refills | 0 days | Status: DC | PRN

## 2017-06-25 NOTE — Progress Notes (Signed)
Date of Service: 06/24/2017    This is a 47 year old female patient who presents to the clinic today with a chief concern of reflex sympathetic dystrophy and severe pain today.  She has reported that over the last 3-4 days, the pain has been unbearable, and she does have an antalgic gait pattern when she came in today.  Today, we did 3 nerve blocks to her posterior tibial, her common peroneal and her anterior ankle, all on the right side, and she reports that this was not as effective today as it has been on previous days.  She is having some additional anxiety due to recent separation with her husband, and this may be exacerbating her condition.    We did renew her prescriptions for Ambien and diazepam.  We also gave her a 7-day dose of 10 mg OxyContin time release to be taken b.i.d.  Plan is for her to follow up with us on an as needed basis.  We will see how she responds to the injection.    ___________________________  Reviewed and Electronically Signed By: Naoma DienerADAM S Skyylar Kopf DPM  Sig Date: 06/26/2017  Sig Time: 20:32:27  Dictated By: Naoma DienerADAM S Robinson Brinkley DPM  Dict Date: 06/24/2017 Dict Time: 03 38 PM    Dictation Date and Time:06/24/2017 15:38:05  Transcription Date and Time:06/25/2017 02:24:05  eScription Dictation id: [id number] Confirmation # :[id number]

## 2017-07-01 ENCOUNTER — Ambulatory Visit (HOSPITAL_BASED_OUTPATIENT_CLINIC_OR_DEPARTMENT_OTHER): Payer: PRIVATE HEALTH INSURANCE | Admitting: Podiatrist

## 2017-07-01 DIAGNOSIS — G9050 Complex regional pain syndrome I, unspecified: Secondary | ICD-10-CM

## 2017-07-01 NOTE — Progress Notes (Addendum)
Subjective:    Madison BjornstadSusan L Pedley is a 47 year old female presenting today with chronic CRPS. She had a shelf fall on her right foot 7.5 years ago while at work. She has been receiving exparel injections with relief for 4-6 weeks. She received injections 06/24/2017, one week ago, and she states she had no relief from these injections. She is interested in repeating injections today.     ROS: pain in right leg. All other systems negative    Objective:  General: Patient is pleasant and in no apparent distress, AOx3.     Lower Extremity Exam- right leg  Vascular: DP/PT 2/4. Cap refill WNL. No edema noted  Ortho: no gross deformity noted. MMT unable to be performed due to pain.   Neuro: light touch sensation intact.   Derm: No erythema noted. No open lesions noted at this time. Bruising noted posterior to fibuar consistent with prior exparel injection    Assessment:  1. CRPS  2.Pain in right foot    Plan:  Madison BjornstadSusan L Azar is a 47 year old female who presents to clinic for CRPS of the right foot.    - Exparel + 1/3 cc Dexamethasone (4mg /ml) injections performed of the Right Common peroneal, Tibial nerve and superficial peroneal nerves without incident  - She will RTC as needed in the future.     This note was created using dictation software. Please excuse any errors in spelling, grammar or syntax. Thank you for your understanding.

## 2017-07-02 NOTE — Progress Notes (Signed)
Date of Service: 07/01/2017    SUBJECTIVE:  This is a 47 year old female patient who presents to the clinic today for followup for intense pain associated with reflex sympathetic dystrophy.  She was seen by me within the last couple of weeks, and we did nerve conduction blocks on her using Exparel.  She noted at that that time that she had very little relief.  She reports today that subsequent to that visit she has had no relief whatsoever.      ASSESSMENT AND PLAN:  In reviewing the location of a small hematoma that formed from her last injection, it appears that the common peroneal block was placed too far from the common peroneal nerve and was less effective than it should have been.  Plan is today to repeat the nerve blocks to the posterior tibial, common peroneal, and peroneal nerves, as well as an ankle block.  She tolerated the 3 injections well today without any complications noted and noted significant improvement.  We will follow up with her on an as-needed basis.    PAST MEDICAL HISTORY:  Reviewed and is otherwise unchanged.    ___________________________  Reviewed and Electronically Signed By: Naoma DienerADAM S Ramzey Petrovic DPM  Sig Date: 07/02/2017  Sig Time: 17:10:24  Dictated By: Naoma DienerADAM S Jazzmyn Filion DPM  Dict Date: 07/01/2017 Dict Time: 10 18 PM    Dictation Date and Time:07/01/2017 22:18:21  Transcription Date and Time:07/02/2017 03:46:15  eScription Dictation id: [id number] Confirmation # :[id number]

## 2017-07-03 MED ORDER — BUPIVACAINE LIPOSOME 1.3 % IJ SUSP: 1 mL | mL | Freq: Once | 0 refills | 0 days | Status: AC

## 2017-07-03 MED ORDER — DEXAMETHASONE SODIUM PHOSPHATE 4 MG/ML IJ SOLN: 2 mg | mL | Freq: Once | INTRAMUSCULAR | 0 refills | 0 days | Status: AC

## 2017-07-03 MED ORDER — DEXAMETHASONE SODIUM PHOSPHATE 4 MG/ML IJ SOLN
2.00 mg | Freq: Once | INTRAMUSCULAR | 0 refills | Status: AC
Start: 2017-07-03 — End: 2017-07-03

## 2017-07-03 MED ORDER — BUPIVACAINE LIPOSOME 1.3 % IJ SUSP
1.00 mL | Freq: Once | INTRAMUSCULAR | 0 refills | Status: AC
Start: 2017-07-03 — End: 2017-07-03

## 2017-07-18 MED ORDER — DEXAMETHASONE SODIUM PHOSPHATE 4 MG/ML IJ SOLN: 2 mg | mL | Freq: Once | INTRAMUSCULAR | 0 refills | 0 days | Status: AC

## 2017-07-18 MED ORDER — BUPIVACAINE LIPOSOME 1.3 % IJ SUSP
1.00 mL | Freq: Once | INTRAMUSCULAR | 0 refills | Status: AC
Start: 2017-07-18 — End: 2017-07-18

## 2017-07-18 MED ORDER — DEXAMETHASONE SODIUM PHOSPHATE 4 MG/ML IJ SOLN
2.00 mg | Freq: Once | INTRAMUSCULAR | 0 refills | Status: AC
Start: 2017-07-18 — End: 2017-07-18

## 2017-07-18 MED ORDER — BUPIVACAINE LIPOSOME 1.3 % IJ SUSP: 1 mL | mL | Freq: Once | 0 refills | 0 days | Status: AC

## 2017-08-04 ENCOUNTER — Other Ambulatory Visit (HOSPITAL_BASED_OUTPATIENT_CLINIC_OR_DEPARTMENT_OTHER): Payer: Self-pay | Admitting: Podiatrist

## 2017-08-04 DIAGNOSIS — L03031 Cellulitis of right toe: Secondary | ICD-10-CM

## 2017-08-04 MED ORDER — AZITHROMYCIN 250 MG PO TABS
250.0000 mg | ORAL_TABLET | Freq: Every day | ORAL | 0 refills | Status: AC
Start: 2017-08-04 — End: 2017-08-09

## 2017-08-04 MED ORDER — AZITHROMYCIN 250 MG PO TABS: 250 mg | tablet | Freq: Every day | ORAL | 0 refills | 0 days | Status: AC

## 2017-08-15 ENCOUNTER — Ambulatory Visit (HOSPITAL_BASED_OUTPATIENT_CLINIC_OR_DEPARTMENT_OTHER): Payer: PRIVATE HEALTH INSURANCE | Admitting: Podiatrist

## 2017-08-15 DIAGNOSIS — M79671 Pain in right foot: Secondary | ICD-10-CM

## 2017-08-15 DIAGNOSIS — G9050 Complex regional pain syndrome I, unspecified: Secondary | ICD-10-CM

## 2017-08-15 MED ORDER — ZOLPIDEM TARTRATE 10 MG PO TABS
10.0000 mg | ORAL_TABLET | Freq: Every evening | ORAL | 0 refills | Status: DC | PRN
Start: 2017-08-15 — End: 2017-09-12

## 2017-08-15 MED ORDER — OXYCODONE HCL ER 10 MG PO T12A: 10 mg | tablet | Freq: Two times a day (BID) | ORAL | 0 refills | 0 days | Status: AC

## 2017-08-15 MED ORDER — DIAZEPAM 5 MG PO TABS: tablet | ORAL | 0 refills | 0 days | Status: DC

## 2017-08-15 MED ORDER — ZOLPIDEM TARTRATE 10 MG PO TABS: 10 mg | tablet | Freq: Every evening | ORAL | 0 refills | 0 days | Status: DC | PRN

## 2017-08-15 MED ORDER — OXYCODONE HCL ER 10 MG PO T12A
10.00 mg | EXTENDED_RELEASE_TABLET | Freq: Two times a day (BID) | ORAL | 0 refills | Status: AC
Start: 2017-08-15 — End: 2017-08-22

## 2017-08-15 MED ORDER — DIAZEPAM 5 MG PO TABS
ORAL_TABLET | ORAL | 0 refills | Status: DC
Start: 2017-08-15 — End: 2017-09-12

## 2017-08-15 NOTE — Progress Notes (Signed)
Pt states no change of med list today 08/15/17

## 2017-08-17 MED ORDER — BUPIVACAINE LIPOSOME 1.3 % IJ SUSP
1.00 mL | Freq: Once | INTRAMUSCULAR | 0 refills | Status: AC
Start: 2017-08-17 — End: 2017-08-17

## 2017-08-17 MED ORDER — BUPIVACAINE LIPOSOME 1.3 % IJ SUSP: 1 mL | mL | Freq: Once | 0 refills | 0 days | Status: AC

## 2017-08-17 MED ORDER — DEXAMETHASONE SODIUM PHOSPHATE 4 MG/ML IJ SOLN
2.00 mg | Freq: Once | INTRAMUSCULAR | 0 refills | Status: AC
Start: 2017-08-17 — End: 2017-08-17

## 2017-08-17 MED ORDER — DEXAMETHASONE SODIUM PHOSPHATE 4 MG/ML IJ SOLN: 2 mg | mL | Freq: Once | INTRAMUSCULAR | 0 refills | 0 days | Status: AC

## 2017-08-17 NOTE — Progress Notes (Signed)
This is a 47 year old female patient who presents to the clinic today with a chief concern of reflex sympathetic dystrophy and severe pain today in her right foot and lower leg.  She has reported that over the last 3-4 days, the pain has been unbearable, and she does have an antalgic gait pattern when she came in today. Her skin has the classic mottled appearance, and the foot is cold to touch.     Today, we did 3 nerve blocks to her posterior tibial, her common peroneal and her anterior ankle, all on the right side.  Each injection contained 5cc of Exparel, 1/3 cc of Dexamethasone (4mg /ml).  She is having some additional anxiety due to recent separation with her husband, and this may be exacerbating her condition.    We did renew her prescriptions for Ambien and diazepam.  We also gave her a 7-day dose of 10 mg OxyContin time release to be taken b.i.d.  Plan is for her to follow up with us on an as needed basis.  We will see how she responds to the injection.

## 2017-08-24 ENCOUNTER — Telehealth (HOSPITAL_BASED_OUTPATIENT_CLINIC_OR_DEPARTMENT_OTHER): Payer: Self-pay | Admitting: Licensed Practical Nurse

## 2017-08-24 NOTE — Progress Notes (Signed)
[facility] [phone]. Message left via VM, diagnostic Code for RSD. G90-50 M79.671      If any questions call CSS.

## 2017-09-12 ENCOUNTER — Encounter (HOSPITAL_BASED_OUTPATIENT_CLINIC_OR_DEPARTMENT_OTHER): Payer: Self-pay | Admitting: Podiatrist

## 2017-09-12 ENCOUNTER — Ambulatory Visit (HOSPITAL_BASED_OUTPATIENT_CLINIC_OR_DEPARTMENT_OTHER): Payer: PRIVATE HEALTH INSURANCE | Admitting: Podiatrist

## 2017-09-12 DIAGNOSIS — S9031XA Contusion of right foot, initial encounter: Secondary | ICD-10-CM

## 2017-09-12 DIAGNOSIS — M7741 Metatarsalgia, right foot: Secondary | ICD-10-CM

## 2017-09-12 DIAGNOSIS — G9050 Complex regional pain syndrome I, unspecified: Secondary | ICD-10-CM

## 2017-09-12 DIAGNOSIS — G5761 Lesion of plantar nerve, right lower limb: Secondary | ICD-10-CM

## 2017-09-12 LAB — XR FOOT RIGHT MINIMUM 3 VIEWS

## 2017-09-12 MED ORDER — TRAMADOL HCL 50 MG PO TABS: 50 mg | tablet | Freq: Three times a day (TID) | ORAL | 0 refills | 0 days | Status: AC | PRN

## 2017-09-12 MED ORDER — ZOLPIDEM TARTRATE 10 MG PO TABS
10.0000 mg | ORAL_TABLET | Freq: Every evening | ORAL | 0 refills | Status: DC | PRN
Start: 2017-09-12 — End: 2017-10-03

## 2017-09-12 MED ORDER — ZOLPIDEM TARTRATE 10 MG PO TABS: 10 mg | tablet | Freq: Every evening | ORAL | 0 refills | 0 days | Status: DC | PRN

## 2017-09-12 MED ORDER — TRAMADOL HCL 50 MG PO TABS
50.00 mg | ORAL_TABLET | Freq: Three times a day (TID) | ORAL | 0 refills | Status: AC | PRN
Start: 2017-09-12 — End: 2017-09-19

## 2017-09-12 MED ORDER — DIAZEPAM 5 MG PO TABS
ORAL_TABLET | ORAL | 0 refills | Status: DC
Start: 2017-09-12 — End: 2017-10-03

## 2017-09-12 MED ORDER — DIAZEPAM 5 MG PO TABS: tablet | ORAL | 0 refills | 0 days | Status: DC

## 2017-09-12 NOTE — Progress Notes (Signed)
Date of Service: 09/12/2017    SUBJECTIVE:  This is a 47 year old female patient who returns to the clinic today with a chief concern of severe pain in her right foot.  She has been under our care for over a year for the treatment of reflex sympathetic dystrophy in this limb and requires injections approximately once a month at this point.  Today, she presents with markedly increased pain in her right foot.  Pain and tenderness is elicited along the lateral margin of her right foot in particular.  Radiographs were ordered including AP, lateral, and oblique views, which demonstrated a possible stress fracture of the cuboid.  This does coincide precisely with her greatest area of discomfort.  She continues to have evidence of reflux sympathetic dystrophy, which is certainly exacerbating her new injury.  After further discussion, she reports that she has been trying to run more and climb stairs more and this may have contributed to her injury.  Treatment today consisted of digital nerve blocks to the common peroneal, posterior tibial, and peroneal nerves.  She tolerated the 3 injections well without any complications noted.  We also blocked her anterior tibial nerve.  With regard to her foot injury, I think that immobilization with a fixed ankle boot would give her the most protection.  We will have her follow up with us in 1 month as needed.    PAST MEDICAL HISTORY:  Reviewed and is otherwise unchanged.    REVIEW OF SYSTEMS:  She denies fever, chills, nausea, vomiting, night sweats, shortness of breath, and calf pain.    PHYSICAL EXAMINATION:  She has palpable DP and PT pulses bilaterally.  Sensorium is described as hypersensitive.  She has distal cooling and mottled skin appearance consistent with reflex sympathetic dystrophy.    PLAN:  Follow up with her as indicated.      We spent 15 minutes with her today.  Most this time was used to review and discuss her new lateral foot  injury.    ___________________________  Reviewed and Electronically Signed By: Naoma DienerADAM S Issam Carlyon DPM  Sig Date: 09/13/2017  Sig Time: 16:58:22  Dictated By: Naoma DienerADAM S Scotlynn Noyes DPM  Dict Date: 09/12/2017 Dict Time: 07 17 PM    Dictation Date and Time:09/12/2017 19:17:38  Transcription Date and Time:09/12/2017 23:36:02  eScription Dictation id: [id number] Confirmation # :[id number]

## 2017-09-27 DIAGNOSIS — M7741 Metatarsalgia, right foot: Secondary | ICD-10-CM | POA: Insufficient documentation

## 2017-09-27 DIAGNOSIS — S9031XA Contusion of right foot, initial encounter: Secondary | ICD-10-CM | POA: Insufficient documentation

## 2017-09-27 DIAGNOSIS — G5761 Lesion of plantar nerve, right lower limb: Secondary | ICD-10-CM | POA: Insufficient documentation

## 2017-09-27 MED ORDER — DEXAMETHASONE SODIUM PHOSPHATE 4 MG/ML IJ SOLN
2.00 mg | Freq: Once | INTRAMUSCULAR | 0 refills | Status: AC
Start: 2017-09-27 — End: 2017-09-27

## 2017-09-27 MED ORDER — BUPIVACAINE LIPOSOME 1.3 % IJ SUSP
1.00 mL | Freq: Once | INTRAMUSCULAR | 0 refills | Status: AC
Start: 2017-09-27 — End: 2017-09-27

## 2017-09-27 MED ORDER — BUPIVACAINE LIPOSOME 1.3 % IJ SUSP: 1 mL | mL | Freq: Once | 0 refills | 0 days | Status: AC

## 2017-09-27 MED ORDER — DEXAMETHASONE SODIUM PHOSPHATE 4 MG/ML IJ SOLN: 2 mg | mL | Freq: Once | INTRAMUSCULAR | 0 refills | 0 days | Status: AC

## 2017-10-03 ENCOUNTER — Ambulatory Visit (HOSPITAL_BASED_OUTPATIENT_CLINIC_OR_DEPARTMENT_OTHER): Payer: PRIVATE HEALTH INSURANCE | Admitting: Podiatrist

## 2017-10-03 ENCOUNTER — Ambulatory Visit (HOSPITAL_BASED_OUTPATIENT_CLINIC_OR_DEPARTMENT_OTHER): Payer: Self-pay | Admitting: Podiatrist

## 2017-10-03 DIAGNOSIS — M79671 Pain in right foot: Secondary | ICD-10-CM

## 2017-10-03 DIAGNOSIS — G9050 Complex regional pain syndrome I, unspecified: Secondary | ICD-10-CM

## 2017-10-03 DIAGNOSIS — S9031XD Contusion of right foot, subsequent encounter: Secondary | ICD-10-CM

## 2017-10-03 LAB — XR FOOT RIGHT MINIMUM 3 VIEWS

## 2017-10-03 MED ORDER — ZOLPIDEM TARTRATE 10 MG PO TABS
10.0000 mg | ORAL_TABLET | Freq: Every evening | ORAL | 0 refills | Status: DC | PRN
Start: 2017-10-03 — End: 2017-10-24

## 2017-10-03 MED ORDER — ZOLPIDEM TARTRATE 10 MG PO TABS: 10 mg | tablet | Freq: Every evening | ORAL | 0 refills | 0 days | Status: DC | PRN

## 2017-10-03 MED ORDER — DIAZEPAM 5 MG PO TABS: tablet | ORAL | 0 refills | 0 days | Status: DC

## 2017-10-03 MED ORDER — DIAZEPAM 5 MG PO TABS
ORAL_TABLET | ORAL | 0 refills | Status: DC
Start: 2017-10-03 — End: 2017-10-24

## 2017-10-03 NOTE — Addendum Note (Signed)
Addended by: Renold Don on: 10/03/2017 08:24 AM     Modules accepted: Orders

## 2017-10-03 NOTE — Progress Notes (Signed)
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 She had a shelf fall on her right foot about 8 years ago while at work. She has been receiving exparel injections with relief for 4-6 weeks. Her last treatment on 09/12/2017 consisted of digital nerve blocks to the common peroneal, posterior tibial, peroneal and anterior tibial nerves.  In addition to this chronic problem, she has had a new onset pain in the cuboid region and x-ray imaging obtained during the last visit was concerning for a stress fracture of the cuboid bone. She then was put in a CAM boot for immobilization and protection. Today she would like to receive another exparel injection for her CRPS condition. Otherwise, she reports doing better in a CAM boot, which provides her some comfort when walking.     ROS: Denies any constitutional symptoms. Endorses pain in right leg.     Objective:  General: Patient is pleasant and in no apparent distress, AOx3. Non-toxic appearance.     Lower Extremity Exam- right leg:  Vascular: DP/PT pulses are palpable 2/4. Capillary refill time is brisk to all digits. No edema noted. Skin temperature gradient is cool to warm from distal to proximal. There is some cyanotic appearance noted to the hallux, however immediate capillary refill present.   Ortho: No gross bone or soft tissue deformity noted. Muscle strength is 5/5 in all muscle groups of lower extremity.   Neuro: Light touch sensation intact.   Derm: No erythema noted. No open lesions noted at this time. Nails normotrophic, well trimmed.     Assessment:  1. CRPS  2.Pain in right foot  3. Stress fracture, cuboid bone, right     Plan:  Madison Mckay is a 47 year old female who presents to clinic for CRPS of the right foot.  Today the patient was provided with a nerve block to her right lower extremity consisting of Exparel and  Dexamethasone ( /ml) injections performed of the Right Common peroneal, Posterior Tibial nerve, Anterior Tibial nerve and Superficial Peroneal nerve without incident.  Total amount of injectables as follows: 3 syringes, each with a mixture of 5 cc Exparel and 0.25 cc dexamethasone.  The radiographs were reviewed and show some improvement in the cuboid bone appearance, indicating healing of the stress fracture.  However given the fact that she presents with a significant amount of pain over the fifth cuboid region, decision was made to place the patient in a cast and proceed with nonweightbearing for the next 3 weeks.  The cast was applied in the clinic today.  Postoperative shoe as well as crutches were dispensed.  Patient was instructed to follow-up in 3 weeks for cast removal.

## 2017-10-10 DIAGNOSIS — M79671 Pain in right foot: Secondary | ICD-10-CM | POA: Insufficient documentation

## 2017-10-10 MED ORDER — BUPIVACAINE LIPOSOME 1.3 % IJ SUSP
1.00 mL | Freq: Once | INTRAMUSCULAR | 0 refills | Status: AC
Start: 2017-10-10 — End: 2017-10-10

## 2017-10-10 MED ORDER — DEXAMETHASONE SODIUM PHOSPHATE 4 MG/ML IJ SOLN
2.00 mg | Freq: Once | INTRAMUSCULAR | 0 refills | Status: AC
Start: 2017-10-10 — End: 2017-10-10

## 2017-10-10 MED ORDER — DEXAMETHASONE SODIUM PHOSPHATE 4 MG/ML IJ SOLN: 2 mg | mL | Freq: Once | INTRAMUSCULAR | 0 refills | 0 days | Status: AC

## 2017-10-10 MED ORDER — BUPIVACAINE LIPOSOME 1.3 % IJ SUSP: 1 mL | mL | Freq: Once | 0 refills | 0 days | Status: AC

## 2017-10-10 NOTE — Progress Notes (Signed)
Patient was seen with resident Dr. Sikar.  I concur with their assessment and treatment plan.  I was present during their examination, and all procedures were performed under my direct supervision, or by me personally.    We spent 15 minutes with this patient, and most of this time was used to discuss treatment options.

## 2017-10-24 ENCOUNTER — Ambulatory Visit (HOSPITAL_BASED_OUTPATIENT_CLINIC_OR_DEPARTMENT_OTHER): Payer: PRIVATE HEALTH INSURANCE | Admitting: Podiatrist

## 2017-10-24 DIAGNOSIS — G9050 Complex regional pain syndrome I, unspecified: Secondary | ICD-10-CM

## 2017-10-24 DIAGNOSIS — M79671 Pain in right foot: Secondary | ICD-10-CM

## 2017-10-24 MED ORDER — ZOLPIDEM TARTRATE 10 MG PO TABS
10.0000 mg | ORAL_TABLET | Freq: Every evening | ORAL | 0 refills | Status: DC | PRN
Start: 2017-10-24 — End: 2017-11-14

## 2017-10-24 MED ORDER — ZOLPIDEM TARTRATE 10 MG PO TABS: 10 mg | tablet | Freq: Every evening | ORAL | 0 refills | 0 days | Status: DC | PRN

## 2017-10-24 MED ORDER — DIAZEPAM 5 MG PO TABS
ORAL_TABLET | ORAL | 0 refills | Status: DC
Start: 2017-10-24 — End: 2017-11-14

## 2017-10-24 MED ORDER — DIAZEPAM 5 MG PO TABS: tablet | ORAL | 0 refills | 0 days | Status: DC

## 2017-10-24 NOTE — Progress Notes (Signed)
CC: Chronic right foot pain    Subjective:  Madison Mckay is a 47 year old female who returns to clinic for on-going management of chronic right foot pain. In summary, approximately 8 years ago, a book shelf fell on the patient's right foot while at work. Since then, she has been having persistent pain, tingling and numbness to the area. She was later diagnosed with CRPS. To date, treatment has consisted of monthly digital nerve blocks to the common peroneal, posterior tibial and anterior tibial nerves with Exparel and Dexamethasone injections. Her most recent injections occurred at her last visit on 10/03/17. In addition to this, patient was recently found to have a concomitant cuboid stress fracture. She was immobilized in  CAM boot for a few weeks with minimal improvement, and was subsequently placed in a full below knee cast being NWB on the RLE to see if symptoms improve.    On returning today, she relates a decrease in pain, rating it 4/10 on the pain scale. Outside of this, she states she is doing well and denies any other pedal complaints at this time.    ROS: Patient denies any nausea, vomiting, fever, chills, chest pain and shortness of breath.    Objective:    General: Patient ambulates into clinic wearing full below knee cast on the RLE, using crutches to assist with balance. She is AAOx3, in NAD.    Lower Extremity Exam:  Vasc: DP and PT pulses palpable bilaterally. Right hallux has mild bluish discoloration. Capillary refill time brisk to all digits. Skin temperature gradient within normal limits. No edema noted.  Neuro: Light touch sensation intact bilaterally.  Derm: Skin texture smooth, turgor supple. No open lesions present. No interdigital maceration.   Musc: Tenderness to direct palpation of the lateral aspect of the right foot, at the level of the cuboid. There is also generalize pain to the dorsal foot over metatarsal shafts 2-5. Ankle and STJ ROM within normal limits. No gross pedal  deformities identified. Muscle strength 5/5 to all four lower extremity muscle groups bilaterally.    Assessment:  -Chronic right foot pain  -CRPS, right foot  -Cuboid stress fracture, right foot    Plan:  47 year old female returning to clinic for evaluation of chronic right foot pain secondary to CRPS and stress fracture to the cuboid. Today, cast was removed and RLE cleansed thoroughly with alcohol. Patient was examined and noted to have diffuse pain to the dorsal foot, as well as the cuboid, though pain has decreased from previous visit 1 month ago. Given her overall progress, decision was made to transition from full below knee cast to CAM boot. Patient was advised to gradually transition from being NWB in the CAM boot with crutches, to WBAT in the CAM boot with crutches, to WBAT in the CAM boot without crutches.     In addition to this, she was given local nerve blocks to the common peroneal, posterior tibial and anterior tibial nerves on the right. Prior to procedure, the correct patient, date of birth and location were verified. Patient was then blocked with amount of injectables as follows: 3 syringes, each with a mixture of 5 cc Exparel and 0.25 cc dexamethasone. She tolerated the procedure well.     She was dispensed a prescription for Ambien  and Valium  for continued pain relief. She will continue to monitor her symptoms and return to clinic in 4 weeks for re-evaluation.     Exparel Injection  Part No: 20034-5  Lot No: [id number]  Exp: Oct 2019

## 2017-11-14 ENCOUNTER — Ambulatory Visit (HOSPITAL_BASED_OUTPATIENT_CLINIC_OR_DEPARTMENT_OTHER): Payer: PRIVATE HEALTH INSURANCE | Admitting: Podiatrist

## 2017-11-14 DIAGNOSIS — M79671 Pain in right foot: Secondary | ICD-10-CM

## 2017-11-14 DIAGNOSIS — S9031XD Contusion of right foot, subsequent encounter: Secondary | ICD-10-CM

## 2017-11-14 DIAGNOSIS — G9050 Complex regional pain syndrome I, unspecified: Secondary | ICD-10-CM

## 2017-11-14 MED ORDER — DIAZEPAM 5 MG PO TABS: tablet | ORAL | 0 refills | 0 days | Status: DC

## 2017-11-14 MED ORDER — ZOLPIDEM TARTRATE 10 MG PO TABS
10.0000 mg | ORAL_TABLET | Freq: Every evening | ORAL | 0 refills | Status: DC | PRN
Start: 2017-11-14 — End: 2017-12-19

## 2017-11-14 MED ORDER — DIAZEPAM 5 MG PO TABS
ORAL_TABLET | ORAL | 0 refills | Status: DC
Start: 2017-11-14 — End: 2017-12-19

## 2017-11-14 MED ORDER — OXYCODONE-ACETAMINOPHEN 5-325 MG PO TABS
1.0000 | ORAL_TABLET | Freq: Two times a day (BID) | ORAL | 0 refills | Status: DC | PRN
Start: 2017-11-14 — End: 2017-12-19

## 2017-11-14 MED ORDER — OXYCODONE-ACETAMINOPHEN 5-325 MG PO TABS: 1 | tablet | Freq: Two times a day (BID) | ORAL | 0 refills | 0 days | Status: DC | PRN

## 2017-11-14 MED ORDER — ZOLPIDEM TARTRATE 10 MG PO TABS: 10 mg | tablet | Freq: Every evening | ORAL | 0 refills | 0 days | Status: DC | PRN

## 2017-11-14 NOTE — Progress Notes (Signed)
Pt states no change of med list today 11/12/17

## 2017-11-14 NOTE — Progress Notes (Signed)
CC: Chronic right foot pain    Subjective:  Madison Mckay is a 47 year old female who returns to clinic for on-going management of chronic right foot pain.Approximately 8 years ago a book shelf fell on the patient's right foot which led her to develop CRPS c/b osteopenia and most recently R foot cuboid stress fracture. To date, treatment has consisted of monthly digital nerve blocks to the common peroneal, posterior tibial and anterior tibial nerves with Exparel and Dexamethasone injections. She has found much success with this treatment. Patient is a runner and states that these monthly injections allow her to return to her baseline activity level. She presents today for another monthly injection. She denies any interval changes to her health. She does note that she has some mild pain of her big toe when she runs but this is bearable.     ROS: Patient denies any nausea, vomiting, fever, chills, chest pain and shortness of breath.    Objective:    General: Patient ambulates into clinic wearing sneakers.     Lower Extremity Exam:  Vasc: DP and PT pulses palpable bilaterally. Capillary refill time brisk to all digits.Right hallux has mild bluish discoloration.   Skin temperature gradient slightly cooler on right foot.  Neuro: Light touch sensation intact bilaterally. Slight hyperesthesia to the L4, L5, and S1 dermatomes on the R foot.   Derm: Skin texture smooth, turgor supple. No open lesions present. No interdigital maceration.   Musc:There is reduced tenderness to direct palpation of the lateral aspect of the right foot at the level of the cuboid. There is also generalized pain to the dorsal foot over metatarsal shafts 2-5. Ankle and STJ ROM within normal limits. No gross pedal deformities identified. Muscle strength 5/5 to all four lower extremity muscle groups bilaterally.    Assessment:  -Chronic right foot pain  -CRPS, right foot  -Cuboid stress fracture, right foot    Plan:  47 year old female returning to  clinic for continued symptomatic treatment of her CRPS. She was given local nerve blocks to the common peroneal, posterior tibial and anterior tibial nerves on the right. Prior to procedure, the correct patient, date of birth and location were verified. Patient was then blocked with amount of injectables as follows: 3 syringes, each with a mixture of 5 cc Exparel and 0.25 cc dexamethasone. She tolerated the procedure well.     She was dispensed a prescription for Ambien 10mg  and Valium 5mg  for continued pain relief by Dr. Rosilyn MingsLandsman. She will continue to monitor her symptoms and return to clinic in 4 weeks for re-evaluation.     Exparel Injection  Part No: 20034-5  Lot No: [id number]   Exp: Oct 2019

## 2017-11-20 MED ORDER — DEXAMETHASONE SODIUM PHOSPHATE 4 MG/ML IJ SOLN: 2 mg | mL | Freq: Once | INTRAMUSCULAR | 0 refills | 0 days | Status: AC

## 2017-11-20 MED ORDER — BUPIVACAINE LIPOSOME 1.3 % IJ SUSP: 1 mL | mL | Freq: Once | 0 refills | 0 days | Status: AC

## 2017-11-20 MED ORDER — BUPIVACAINE LIPOSOME 1.3 % IJ SUSP
1.00 mL | Freq: Once | INTRAMUSCULAR | 0 refills | Status: AC
Start: 2017-11-20 — End: 2017-11-20

## 2017-11-20 MED ORDER — DEXAMETHASONE SODIUM PHOSPHATE 4 MG/ML IJ SOLN
2.00 mg | Freq: Once | INTRAMUSCULAR | 0 refills | Status: AC
Start: 2017-11-20 — End: 2017-11-20

## 2017-11-20 NOTE — Progress Notes (Signed)
Patient was seen with resident Dr. Hayman.  I concur with their assessment and treatment plan.  I was present during their examination, and all procedures were performed under my direct supervision, or by me personally.

## 2017-11-24 MED ORDER — BUPIVACAINE LIPOSOME 1.3 % IJ SUSP: 1 mL | mL | Freq: Once | 0 refills | 0 days | Status: AC

## 2017-11-24 MED ORDER — DEXAMETHASONE SODIUM PHOSPHATE 4 MG/ML IJ SOLN: 2 mg | mL | Freq: Once | INTRAMUSCULAR | 0 refills | 0 days | Status: AC

## 2017-11-24 MED ORDER — BUPIVACAINE LIPOSOME 1.3 % IJ SUSP
1.00 mL | Freq: Once | INTRAMUSCULAR | 0 refills | Status: AC
Start: 2017-11-24 — End: 2017-11-24

## 2017-11-24 MED ORDER — DEXAMETHASONE SODIUM PHOSPHATE 4 MG/ML IJ SOLN
2.00 mg | Freq: Once | INTRAMUSCULAR | 0 refills | Status: AC
Start: 2017-11-24 — End: 2017-11-24

## 2017-11-24 NOTE — Progress Notes (Signed)
Patient was seen with resident Dr. Pundu.  I concur with their assessment and treatment plan.  I was present during their examination, and all procedures were performed under my direct supervision, or by me personally.

## 2017-11-29 ENCOUNTER — Other Ambulatory Visit (HOSPITAL_BASED_OUTPATIENT_CLINIC_OR_DEPARTMENT_OTHER): Payer: Self-pay | Admitting: Podiatrist

## 2017-11-29 DIAGNOSIS — L03039 Cellulitis of unspecified toe: Secondary | ICD-10-CM

## 2017-11-29 MED ORDER — AZITHROMYCIN 250 MG PO TABS
250.0000 mg | ORAL_TABLET | Freq: Every day | ORAL | 0 refills | Status: AC
Start: 2017-11-29 — End: 2017-12-04

## 2017-11-29 MED ORDER — AZITHROMYCIN 250 MG PO TABS: 250 mg | tablet | Freq: Every day | ORAL | 0 refills | 0 days | Status: AC

## 2017-12-19 ENCOUNTER — Ambulatory Visit: Payer: Self-pay | Attending: Podiatrist | Admitting: Podiatrist

## 2017-12-19 DIAGNOSIS — S9031XD Contusion of right foot, subsequent encounter: Secondary | ICD-10-CM | POA: Insufficient documentation

## 2017-12-19 DIAGNOSIS — G9050 Complex regional pain syndrome I, unspecified: Secondary | ICD-10-CM | POA: Insufficient documentation

## 2017-12-19 MED ORDER — DIAZEPAM 5 MG PO TABS
ORAL_TABLET | ORAL | 0 refills | Status: AC
Start: 2017-12-19 — End: 2018-01-18

## 2017-12-19 MED ORDER — ZOLPIDEM TARTRATE 10 MG PO TABS
10.00 mg | ORAL_TABLET | Freq: Every evening | ORAL | 0 refills | Status: AC | PRN
Start: 2017-12-19 — End: 2018-01-18

## 2017-12-19 MED ORDER — OXYCODONE-ACETAMINOPHEN 5-325 MG PO TABS: 1 | tablet | Freq: Two times a day (BID) | ORAL | 0 refills | 0 days | Status: AC | PRN

## 2017-12-19 MED ORDER — OXYCODONE-ACETAMINOPHEN 5-325 MG PO TABS
1.00 | ORAL_TABLET | Freq: Two times a day (BID) | ORAL | 0 refills | Status: AC | PRN
Start: 2017-12-19 — End: 2017-12-26

## 2017-12-19 MED ORDER — DIAZEPAM 5 MG PO TABS: tablet | ORAL | 0 refills | 0 days | Status: AC

## 2017-12-19 MED ORDER — ZOLPIDEM TARTRATE 10 MG PO TABS: 10 mg | tablet | Freq: Every evening | ORAL | 0 refills | 0 days | Status: AC | PRN

## 2017-12-19 NOTE — Progress Notes (Signed)
CC: Chronic right foot pain    Subjective:  Madison Mckay is a 47 year old female who returns to clinic for on-going management of chronic right foot pain.Approximately 8 years ago a book shelf fell on the patient's right foot which led her to develop CRPS c/b osteopenia and most recently R foot cuboid stress fracture. To date, treatment has consisted of monthly digital nerve blocks to the common peroneal, posterior tibial and anterior tibial nerves with Exparel and Dexamethasone injections. She has found much success with this treatment. Patient is a runner and states that these monthly injections allow her to return to her baseline activity level.  She states she did yoga yesterday therefore she is experiencing foot pain now and she also believes she may have injured her shoulder. She presents today for another monthly injection. She denies any interval changes to her health.     ROS: Patient denies any nausea, vomiting, fever, chills, chest pain and shortness of breath.    Objective:    General: Patient ambulates into clinic wearing sneakers.     Lower Extremity Exam:  Vasc: DP and PT pulses palpable bilaterally. Capillary refill time brisk to all digits.Right hallux has mild bluish discoloration.   Skin temperature gradient slightly cooler on right foot.  Neuro: Light touch sensation intact bilaterally. Slight hyperesthesia to the L4, L5, and S1 dermatomes on the R foot.   Derm: Skin texture smooth, turgor supple. No open lesions present. No interdigital maceration.   Musc:There is reduced tenderness to direct palpation of the lateral aspect of the right foot at the level of the cuboid. There is also generalized pain to the dorsal foot over metatarsal shafts 2-5. Ankle and STJ ROM within normal limits. No gross pedal deformities identified. Muscle strength 5/5 to all four lower extremity muscle groups bilaterally.    Assessment:  -Chronic right foot pain  -CRPS, right foot  -Cuboid stress fracture, right  foot    Plan:  47 year old female returning to clinic for continued symptomatic treatment of her CRPS. She was given local nerve blocks to the common peroneal, posterior tibial and anterior tibial nerves on the right. Prior to procedure, the correct patient, date of birth and location were verified. Patient was then blocked with amount of injectables as follows: 3 syringes, each with a mixture of 5 cc Exparel and 0.25 cc dexamethasone. She tolerated the procedure well.     She was dispensed a prescription for Ambien 10mg , oxycodone 5 mg and Valium 5mg  by Dr. Rosilyn MingsLandsman for continued pain relief. She will continue to monitor her symptoms and return to clinic in 4 weeks for re-evaluation.

## 2017-12-19 NOTE — Progress Notes (Signed)
Pt states of med list today 12/19/17

## 2017-12-28 MED ORDER — DEXAMETHASONE SODIUM PHOSPHATE 4 MG/ML IJ SOLN: 2 mg | mL | Freq: Once | INTRAMUSCULAR | 0 refills | 0 days | Status: AC

## 2017-12-28 MED ORDER — BUPIVACAINE LIPOSOME 1.3 % IJ SUSP: 1 mL | mL | Freq: Once | 0 refills | 0 days | Status: AC

## 2017-12-28 MED ORDER — BUPIVACAINE LIPOSOME 1.3 % IJ SUSP
1.00 mL | Freq: Once | INTRAMUSCULAR | 0 refills | Status: AC
Start: 2017-12-28 — End: 2017-12-28

## 2017-12-28 MED ORDER — DEXAMETHASONE SODIUM PHOSPHATE 4 MG/ML IJ SOLN
2.00 mg | Freq: Once | INTRAMUSCULAR | 0 refills | Status: AC
Start: 2017-12-28 — End: 2017-12-28

## 2017-12-28 NOTE — Progress Notes (Signed)
Patient was seen with resident Dr. Pundu.  I concur with their assessment and treatment plan.  I was present during their examination, and all procedures were performed under my direct supervision, or by me personally.    We spent 15 minutes with this patient, and most of this time was used to discuss treatment options.

## 2018-01-20 ENCOUNTER — Ambulatory Visit: Payer: Self-pay | Attending: Internal Medicine | Admitting: Podiatrist

## 2018-01-20 DIAGNOSIS — G9050 Complex regional pain syndrome I, unspecified: Secondary | ICD-10-CM | POA: Insufficient documentation

## 2018-01-20 DIAGNOSIS — M79671 Pain in right foot: Secondary | ICD-10-CM | POA: Insufficient documentation

## 2018-01-20 MED ORDER — ZOLPIDEM TARTRATE 10 MG PO TABS: 10 mg | tablet | Freq: Every evening | ORAL | 0 refills | 0 days | Status: DC | PRN

## 2018-01-20 MED ORDER — OXYCODONE HCL 5 MG PO TABS: 5 mg | tablet | Freq: Every evening | ORAL | 0 refills | 0 days | Status: AC | PRN

## 2018-01-20 MED ORDER — ZOLPIDEM TARTRATE 10 MG PO TABS
10.0000 mg | ORAL_TABLET | Freq: Every evening | ORAL | 0 refills | Status: DC | PRN
Start: 2018-01-20 — End: 2018-02-24

## 2018-01-20 MED ORDER — OXYCODONE HCL 5 MG PO TABS
5.0000 mg | ORAL_TABLET | Freq: Every evening | ORAL | 0 refills | Status: AC | PRN
Start: 2018-01-20 — End: 2018-02-03

## 2018-01-20 MED ORDER — DIAZEPAM 5 MG PO TABS: tablet | ORAL | 0 refills | 0 days | Status: DC

## 2018-01-20 MED ORDER — CYCLOBENZAPRINE HCL 10 MG PO TABS
10.0000 mg | ORAL_TABLET | Freq: Two times a day (BID) | ORAL | 0 refills | Status: DC | PRN
Start: 2018-01-20 — End: 2018-02-24

## 2018-01-20 MED ORDER — DIAZEPAM 5 MG PO TABS
ORAL_TABLET | ORAL | 0 refills | Status: DC
Start: 2018-01-20 — End: 2018-02-24

## 2018-01-20 MED ORDER — CYCLOBENZAPRINE HCL 10 MG PO TABS: 10 mg | tablet | Freq: Two times a day (BID) | ORAL | 0 refills | 0 days | Status: DC | PRN

## 2018-01-20 NOTE — Progress Notes (Signed)
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  Her right foot and lower leg remains cool to touch as compared to the contralateral limb.  She does have palpable DP and PT pulses in her right foot and lower leg.  She has some blanching and mottling of the skin, which does not improve with dependency.      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  She has resumed her exercise regimen following a contusion to the right foot and is able to get about 3 weeks of relief following an injection.  Three nerve blocks were performed to the common peroneal, posterior tibial, and ankle block, all on the right.  She noted immediate improvement.  Exparel was used with 5 mL of Exparel and 1/3 mL of dexamethasone 4 mg/mL injected to each of the 3 sites.  She tolerated the injection well without any complications noted.  We renewed her prescriptions for Ambien and a small prescription for oxycodone 5 mg 1 tab a day.  I have asked her to follow up with us on an as-needed basis.  We did have a discussion about the use of narcotics and she indicated that she only uses it on rare occasions when her pain is more severe.    ___________________________  Reviewed and Electronically Signed By: Naoma DienerADAM S Dylon Correa DPM  Sig Date: 01/22/2018  Sig Time: 10:42:26  Dictated By: Naoma DienerADAM S Tyisha Cressy DPM  Dict Date: 01/20/2018 Dict Time: 08 31 PM    Dictation Date and Time:01/20/2018 20:31:11  Transcription Date and Time:01/20/2018 21:56:20  eScription Dictation id: [id number] Confirmation # :[id number]

## 2018-01-25 MED ORDER — DEXAMETHASONE SODIUM PHOSPHATE 4 MG/ML IJ SOLN
2.00 mg | Freq: Once | INTRAMUSCULAR | 0 refills | Status: AC
Start: 2018-01-25 — End: 2018-01-25

## 2018-01-25 MED ORDER — BUPIVACAINE LIPOSOME 1.3 % IJ SUSP: 1 mL | mL | Freq: Once | 0 refills | 0 days | Status: AC

## 2018-01-25 MED ORDER — DEXAMETHASONE SODIUM PHOSPHATE 4 MG/ML IJ SOLN: 2 mg | mL | Freq: Once | INTRAMUSCULAR | 0 refills | 0 days | Status: AC

## 2018-01-25 MED ORDER — BUPIVACAINE LIPOSOME 1.3 % IJ SUSP
1.00 mL | Freq: Once | INTRAMUSCULAR | 0 refills | Status: AC
Start: 2018-01-25 — End: 2018-01-25

## 2018-01-25 NOTE — Progress Notes (Signed)
In accordance with the opiate bill enacted on 08/19/14, the patient's condition is such that a non-opiate medication was not appropriate and:    1. The treatment of the condition associated with the prescribing of an opiate/opioid medication is due to addiction treatment, acute condition, chronic pain, cancer, or palliative care.    2. The patient's medical team has evaluated the patient’s current condition, risk factors, history of substance abuse, if any, and current medications.  3. The patient's medical team has informed the patient that the prescribed medication is an appropriate course of treatment based on the medical need of the patient.    4. The patient's medical team has consulted with the patient regarding the quantity of the opiate prescribed and informed the patient of his right to fill the prescription in a lesser quantity.  5. The patient's medical team has expressly informed the patient of the risks associated with the opiate prescribed.  6. If this patient is prescribed an extended-release opiate, the patient has signed a Controlled Substance Agreement with [his/her] medical team.  7. I or an authorized delegate have checked the Prescription Monitoring Program ([place]) for this patient.

## 2018-02-24 ENCOUNTER — Ambulatory Visit: Payer: Self-pay | Attending: Podiatrist | Admitting: Podiatrist

## 2018-02-24 DIAGNOSIS — S9001XA Contusion of right ankle, initial encounter: Secondary | ICD-10-CM | POA: Insufficient documentation

## 2018-02-24 DIAGNOSIS — S9031XA Contusion of right foot, initial encounter: Secondary | ICD-10-CM | POA: Insufficient documentation

## 2018-02-24 DIAGNOSIS — G9050 Complex regional pain syndrome I, unspecified: Secondary | ICD-10-CM | POA: Insufficient documentation

## 2018-02-24 MED ORDER — CYCLOBENZAPRINE HCL 10 MG PO TABS: 10 mg | tablet | Freq: Two times a day (BID) | ORAL | 0 refills | 0 days | Status: AC | PRN

## 2018-02-24 MED ORDER — DIAZEPAM 5 MG PO TABS
ORAL_TABLET | ORAL | 0 refills | Status: DC
Start: 2018-02-24 — End: 2018-03-27

## 2018-02-24 MED ORDER — ZOLPIDEM TARTRATE 10 MG PO TABS: 10 mg | tablet | Freq: Every evening | ORAL | 0 refills | 0 days | Status: AC | PRN

## 2018-02-24 MED ORDER — CYCLOBENZAPRINE HCL 10 MG PO TABS
10.00 mg | ORAL_TABLET | Freq: Two times a day (BID) | ORAL | 0 refills | Status: AC | PRN
Start: 2018-02-24 — End: 2018-03-26

## 2018-02-24 MED ORDER — DIAZEPAM 5 MG PO TABS: tablet | ORAL | 0 refills | 0 days | Status: AC

## 2018-02-24 MED ORDER — ZOLPIDEM TARTRATE 10 MG PO TABS
10.0000 mg | ORAL_TABLET | Freq: Every evening | ORAL | 0 refills | Status: DC | PRN
Start: 2018-02-24 — End: 2018-03-27

## 2018-02-25 NOTE — Progress Notes (Signed)
Date of Service: 02/24/2018    SUBJECTIVE:  This is a 47 year old female patient who presents to the clinic today with a chief concern of chronic pain and reflex sympathetic dystrophy on her right foot and lower leg.  She has noted some improvement since her last visit.  She denies fever, chills, nausea, vomiting, night sweats, shortness of breath, and calf pain.  Examination of her foot reveals some slight coolness compared to the contralateral limb.  However, there has been some improvement noted since our last visit.  Her toes are not blue today and her foot is only slightly colder than the contralateral one.  Treatment today consisted of 3 nerve blocks to the right leg for the common peroneal, the posterior tibial, and the anterior ankle.    PLAN:  Injections consisted of 5 mL of Exparel and 1/3 of a 1 mL of dexamethasone 4 mg/mL.  She tolerated all 3 nerve blocks well without any complications noted.  Peroneal, saphenous and posterior tibial nerves were all blocked and she noted significant improvement almost immediately.  Plan is for her to follow up with us on an as-needed basis.    ___________________________  Reviewed and Electronically Signed By: Naoma DienerADAM S Lorelei Heikkila DPM  Sig Date: 02/25/2018  Sig Time: 15:54:38  Dictated By: Naoma DienerADAM S Hutton Pellicane DPM  Dict Date: 02/25/2018 Dict Time: 11 44 AM    Dictation Date and Time:02/25/2018 11:44:20  Transcription Date and Time:02/25/2018 13:25:15  eScription Dictation id: [id number] Confirmation # :[id number]

## 2018-03-01 DIAGNOSIS — S9001XA Contusion of right ankle, initial encounter: Secondary | ICD-10-CM | POA: Insufficient documentation

## 2018-03-01 MED ORDER — DEXAMETHASONE SODIUM PHOSPHATE 4 MG/ML IJ SOLN: 2 mg | mL | Freq: Once | INTRAMUSCULAR | 0 refills | 0 days | Status: AC

## 2018-03-01 MED ORDER — BUPIVACAINE LIPOSOME 1.3 % IJ SUSP
1.00 mL | Freq: Once | INTRAMUSCULAR | 0 refills | Status: AC
Start: 2018-03-01 — End: 2018-03-01

## 2018-03-01 MED ORDER — BUPIVACAINE LIPOSOME 1.3 % IJ SUSP: 1 mL | mL | Freq: Once | 0 refills | 0 days | Status: AC

## 2018-03-01 MED ORDER — DEXAMETHASONE SODIUM PHOSPHATE 4 MG/ML IJ SOLN
2.00 mg | Freq: Once | INTRAMUSCULAR | 0 refills | Status: AC
Start: 2018-03-01 — End: 2018-03-01

## 2018-03-03 ENCOUNTER — Ambulatory Visit
Admit: 2018-03-03 | Discharge: 2018-03-03 | Disposition: A | Discharge status: Home / Self Care | Payer: Self-pay | Attending: Podiatrist | Admitting: Podiatrist

## 2018-03-03 ENCOUNTER — Ambulatory Visit: Payer: Self-pay | Attending: Internal Medicine | Admitting: Podiatrist

## 2018-03-03 ENCOUNTER — Ambulatory Visit (HOSPITAL_BASED_OUTPATIENT_CLINIC_OR_DEPARTMENT_OTHER): Admit: 2018-03-03 | Discharge: 2018-03-03 | Disposition: A | Discharge status: Home / Self Care | Payer: Self-pay

## 2018-03-03 DIAGNOSIS — M25571 Pain in right ankle and joints of right foot: Secondary | ICD-10-CM | POA: Insufficient documentation

## 2018-03-03 DIAGNOSIS — S9031XA Contusion of right foot, initial encounter: Secondary | ICD-10-CM

## 2018-03-03 DIAGNOSIS — G9050 Complex regional pain syndrome I, unspecified: Secondary | ICD-10-CM | POA: Insufficient documentation

## 2018-03-03 DIAGNOSIS — S9001XA Contusion of right ankle, initial encounter: Secondary | ICD-10-CM

## 2018-03-03 MED ORDER — KETOROLAC TROMETHAMINE 10 MG PO TABS: 10 mg | tablet | Freq: Three times a day (TID) | ORAL | 0 refills | 0 days | Status: AC | PRN

## 2018-03-03 MED ORDER — KETOROLAC TROMETHAMINE 10 MG PO TABS
10.0000 mg | ORAL_TABLET | Freq: Three times a day (TID) | ORAL | 0 refills | Status: DC | PRN
Start: 2018-03-03 — End: 2018-03-27
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
## 2018-03-10 ENCOUNTER — Ambulatory Visit: Payer: Self-pay | Attending: Podiatrist | Admitting: Podiatrist

## 2018-03-10 DIAGNOSIS — S9031XD Contusion of right foot, subsequent encounter: Secondary | ICD-10-CM | POA: Insufficient documentation

## 2018-03-10 DIAGNOSIS — S9001XD Contusion of right ankle, subsequent encounter: Secondary | ICD-10-CM | POA: Insufficient documentation

## 2018-03-10 DIAGNOSIS — G9050 Complex regional pain syndrome I, unspecified: Secondary | ICD-10-CM | POA: Insufficient documentation

## 2018-03-11 NOTE — Progress Notes (Signed)
Date of Service: 03/10/2018    SUBJECTIVE:  This is a 47 year old female patient who returns to the clinic today with severe pain in her right foot.  She had a contusion to the medial side of her right ankle and now has significant ecchymosis throughout the medial arch area and extending in and around the ankle and slightly proximal to the ankle.  At her last visit, we took x-rays and reviewed those again today.  There are no obvious signs of fracture.  There is a questionable hairline crack in the calcaneus, which is nondisplaced.  This is only seen on the lateral view.  There are no flecks of bone or other signs of osseous injury.  We did request an MRI, which has not been cleared by her insurance yet, and we are trying to work this out.  She also has a history of RSD in this right foot and lower leg and her toes are bluish in coloration consistent with prior visits of her RSD.      OBJECTIVE:  She has palpable DP and PT pulses and we did Doppler the digital arteries to all 5 toes.  She does have immediate capillary refill.  Today, we performed 3 nerve blocks using Exparel with 5 mL of Exparel and 1/3 of a 1 mL of dexamethasone 4 mg/mL, injected at 3 sites about the common peroneal, the posterior tibial, and did an ankle block as well.  She tolerated the 3 nerve blocks without any complications.  With regard to her severe pain in her right foot, she is unable to put her foot on the ground due to severity of her pain.  It appears to be focused around her heel, which makes me very suspicious of her potential for injury to the calcaneus.      ASSESSMENT AND PLAN:  Today, we put her in a fiberglass below knee cast.  She will continue with nonweightbearing as she has been with crutches, and I have asked her to follow up with [place] in 3 weeks.  Of note, as soon as we administered the nerve block, the toes pink up and showed normal warmth.  Treatment plan for today is closed reduction of calcaneal fracture with fiberglass  cast and nerve Block for reflex sympathetic.    ___________________________  Reviewed and Electronically Signed By: Naoma Diener DPM  Sig Date: 03/12/2018  Sig Time: 22:15:36  Dictated By: Naoma Diener DPM  Dict Date: 03/11/2018 Dict Time: 12 30 PM    Dictation Date and Time:03/11/2018 12:30:47  Transcription Date and Time:03/11/2018 13:50:39  eScription Dictation id: [id number] Confirmation # :[id number]

## 2018-03-14 ENCOUNTER — Ambulatory Visit: Payer: Self-pay | Admitting: Podiatrist

## 2018-03-14 ENCOUNTER — Encounter (HOSPITAL_BASED_OUTPATIENT_CLINIC_OR_DEPARTMENT_OTHER): Payer: Self-pay | Admitting: Podiatrist

## 2018-03-14 ENCOUNTER — Telehealth (HOSPITAL_BASED_OUTPATIENT_CLINIC_OR_DEPARTMENT_OTHER): Payer: Self-pay | Admitting: Foot Surgery

## 2018-03-14 DIAGNOSIS — M25571 Pain in right ankle and joints of right foot: Secondary | ICD-10-CM
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
## 2018-03-14 NOTE — Telephone Encounter (Signed)
-----   Message from Allyne Gee sent at 03/14/2018 10:49 AM EDT -----  Regarding: Foot cast got wet, Dr Rosilyn Mings   [facility]    CATIE CHIAO [id number], 47 year old, female, Telephone Information:  Home Phone      [phone]  Work Phone      Not on file.  Mobile          [phone]  Home Phone      [phone]      Patient's Preferred Pharmacy:     [facility] - [address] [place], [place] - [address]  Phone: [phone] Fax: [phone]    [facility] - [place], [place] - [address]  Phone: [phone] Fax: [phone]    [facility] - [address] - [place], [place] - [address]  Phone: [phone] Fax: [phone]    [facility] [phone] - [place], [place] - [address] [facility] [address]  Phone: [phone] Fax: [phone]        CALL BACK NUMBER: [phone]  Best time to call back: today  Patient's language of care: English    Patient does not need an interpreter.    Patient's PCP: Julius Bowels, MD    Person calling on behalf of patient: Patient (self)    Calls today in regards to Ms Cosgriff foot cast that got wet, patient said she spoke to Dr Rosilyn Mings yesterday about coming in to the clinic to have the cast replaced.
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
## 2018-03-21 ENCOUNTER — Ambulatory Visit: Payer: Self-pay | Attending: Podiatrist | Admitting: Podiatrist

## 2018-03-21 ENCOUNTER — Encounter (HOSPITAL_BASED_OUTPATIENT_CLINIC_OR_DEPARTMENT_OTHER): Payer: Self-pay | Admitting: Podiatrist

## 2018-03-21 ENCOUNTER — Other Ambulatory Visit (HOSPITAL_BASED_OUTPATIENT_CLINIC_OR_DEPARTMENT_OTHER): Payer: Self-pay

## 2018-03-21 DIAGNOSIS — M25571 Pain in right ankle and joints of right foot: Secondary | ICD-10-CM | POA: Insufficient documentation

## 2018-03-21 DIAGNOSIS — G9050 Complex regional pain syndrome I, unspecified: Secondary | ICD-10-CM | POA: Insufficient documentation
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
## 2018-03-22 DIAGNOSIS — M25571 Pain in right ankle and joints of right foot: Secondary | ICD-10-CM | POA: Insufficient documentation

## 2018-03-22 NOTE — Progress Notes (Signed)
Patient was seen with resident Dr. Seidel.  I concur with their assessment and treatment plan.  I was present during their examination, and all procedures were performed under my direct supervision, or by me personally.

## 2018-03-24 MED ORDER — DEXAMETHASONE SODIUM PHOSPHATE 4 MG/ML IJ SOLN
2.00 mg | Freq: Once | INTRAMUSCULAR | 0 refills | Status: AC
Start: 2018-03-24 — End: 2018-03-24

## 2018-03-24 MED ORDER — DEXAMETHASONE SODIUM PHOSPHATE 4 MG/ML IJ SOLN: 2 mg | mL | Freq: Once | INTRAMUSCULAR | 0 refills | 0 days | Status: AC

## 2018-03-24 NOTE — Progress Notes (Signed)
RSD, not RSV

## 2018-03-24 NOTE — Progress Notes (Signed)
Date of Service: 03/10/2018    This is a 47 year old female patient who presents to the clinic today for followup for severe pain in her right foot.  She had a contusion to her right medial ankle, which is showing signs of ecchymosis still.  She can barely tolerate any weightbearing to the area.  Review of radiographs is suspicious for a very small stress fracture at the lower 1/2 of the calcaneus just anterior to the calcaneal tubercle.  There is no displacement and a very, very, very slight step-off.      We did request an MRI for her, which has not been approved by her insurance yet, and so we are still waiting.  In the meantime, we will place her in a fiberglass cast to ensure that the area is stabilized.  We also are treating her for RSV and have administered 3 nerve blocks to her common peroneal, her ankle and, her lateral saphenous nerve.  She tolerated 3 blocks with 5 mL of Exparel and a third of a milliliter of dexamethasone 4 mg/mL without any complications noted.  She did note immediate relief.  We will follow up with her in approximately 2 weeks for reevaluation and cast change.    ___________________________  Reviewed and Electronically Signed By: Naoma Diener DPM  Sig Date: 03/24/2018  Sig Time: 16:10:96  Dictated By: Naoma Diener DPM  Dict Date: 03/23/2018 Dict Time: 05 05 PM    Dictation Date and Time:03/23/2018 17:05:33  Transcription Date and Time:03/24/2018 01:31:33  eScription Dictation id: [id number] Confirmation # :[id number]

## 2018-03-27 ENCOUNTER — Ambulatory Visit: Payer: Worker's Comp, Other unspecified | Admitting: Podiatrist

## 2018-03-27 DIAGNOSIS — G9050 Complex regional pain syndrome I, unspecified: Secondary | ICD-10-CM | POA: Diagnosis present

## 2018-03-27 DIAGNOSIS — S9001XD Contusion of right ankle, subsequent encounter: Secondary | ICD-10-CM

## 2018-03-27 MED ORDER — KETOROLAC TROMETHAMINE 10 MG PO TABS
10.0000 mg | ORAL_TABLET | Freq: Three times a day (TID) | ORAL | 0 refills | Status: DC | PRN
Start: 2018-03-27 — End: 2018-04-06

## 2018-03-27 MED ORDER — DIAZEPAM 5 MG PO TABS
ORAL_TABLET | ORAL | 0 refills | Status: DC
Start: 2018-03-27 — End: 2018-04-17

## 2018-03-27 MED ORDER — ZOLPIDEM TARTRATE 10 MG PO TABS
10.0000 mg | ORAL_TABLET | Freq: Every evening | ORAL | 0 refills | Status: DC | PRN
Start: 2018-03-27 — End: 2018-04-17

## 2018-03-27 MED ORDER — OXYCODONE HCL 5 MG PO TABS
5.0000 mg | ORAL_TABLET | Freq: Two times a day (BID) | ORAL | 0 refills | Status: DC | PRN
Start: 2018-03-27 — End: 2018-05-12

## 2018-03-27 NOTE — Progress Notes (Signed)
Date of Service: 03/27/2018    SUBJECTIVE:  This is a 47 year old female patient who presents to the clinic today with a chief concern of pain in her right foot, particularly in the medial ankle region.  She had a contusion to this area several weeks ago.  Most recently, she was seen by Dr. Ferd Hibbs, who removed a cast that was uncomfortable for her and transitioned her to a fixed ankle boot.    PAST MEDICAL HISTORY:  Includes a history of reflex sympathetic dystrophy which we have been treating her for several years, affecting the same foot.    OBJECTIVE:  Clinical examination of her foot today reveals toes which are bluish in coloration and have abnormal distal cooling consistent with reflex sympathetic dystrophy.  This is markedly different from the contralateral limb.  She has palpable DP and PT pulses bilaterally.  Sensorium is grossly intact.  She has hypersensitivity on the right foot and medial ankle.  She has 5/5 muscle strength with some guarding due to the pain she is having.    ASSESSMENT AND PLAN:  Based on clinical examination today, I have recommended that she continue with her boot.  We did ask for an MRI; however, this has not been approved by her Workman's Comp provider and so we are in a holding pattern with regard to soft tissue injury diagnosis.  At her last visit, we did see a small step off in her calcaneus, which was suspicious for a nondisplaced fracture.  Today, we gave her 3 nerve conduction blocks at the posterior tibial, common peroneal, and ankle, all on the right side using Exparel and 1/3 of a milliliter of dexamethasone 4 mg/mL at each of the 3 sites.  She tolerated the injections well without any complications noted and did note improvement right away.  We also gave her a short course of narcotic analgesic and renewed her prescription for Ambien and diazepam.  Plan is to follow up with her in 3 weeks as needed.      We spent 15 minutes with her today.  Most this time was used to  review and discuss her treatment options for her ankle.  We also performed the series of injections.    ___________________________  Reviewed and Electronically Signed By: Naoma Diener DPM  Sig Date: 03/28/2018  Sig Time: 15:56:37  Dictated By: Naoma Diener DPM  Dict Date: 03/27/2018 Dict Time: 02 55 PM    Dictation Date and Time:03/27/2018 14:55:54  Transcription Date and Time:03/27/2018 22:23:37  eScription Dictation id: [id number] Confirmation # :[id number]

## 2018-03-27 NOTE — Progress Notes (Signed)
Pt stated no change of med list today /   /

## 2018-03-27 NOTE — Progress Notes (Signed)
In accordance with the opiate bill enacted on 08/19/14, the patient's condition is such that a non-opiate medication was not appropriate and:    1. The treatment of the condition associated with the prescribing of an opiate/opioid medication is due to addiction treatment, acute condition, chronic pain, cancer, or palliative care.    2. The patient's medical team has evaluated the patient’s current condition, risk factors, history of substance abuse, if any, and current medications.  3. The patient's medical team has informed the patient that the prescribed medication is an appropriate course of treatment based on the medical need of the patient.    4. The patient's medical team has consulted with the patient regarding the quantity of the opiate prescribed and informed the patient of his right to fill the prescription in a lesser quantity.  5. The patient's medical team has expressly informed the patient of the risks associated with the opiate prescribed.  6. If this patient is prescribed an extended-release opiate, the patient has signed a Controlled Substance Agreement with [his/her] medical team.  7. I or an authorized delegate have checked the Prescription Monitoring Program ([place]) for this patient.

## 2018-04-03 ENCOUNTER — Ambulatory Visit (HOSPITAL_BASED_OUTPATIENT_CLINIC_OR_DEPARTMENT_OTHER): Payer: Self-pay | Admitting: Podiatrist

## 2018-04-06 ENCOUNTER — Ambulatory Visit
Admit: 2018-04-06 | Discharge: 2018-04-06 | Disposition: A | Discharge status: Home / Self Care | Payer: Worker's Comp, Other unspecified | Attending: Podiatrist | Admitting: Podiatrist

## 2018-04-06 ENCOUNTER — Ambulatory Visit (HOSPITAL_BASED_OUTPATIENT_CLINIC_OR_DEPARTMENT_OTHER): Payer: Worker's Comp, Other unspecified | Admitting: Podiatrist

## 2018-04-06 DIAGNOSIS — Z4789 Encounter for other orthopedic aftercare: Secondary | ICD-10-CM

## 2018-04-06 DIAGNOSIS — M25571 Pain in right ankle and joints of right foot: Secondary | ICD-10-CM

## 2018-04-06 DIAGNOSIS — S9031XD Contusion of right foot, subsequent encounter: Secondary | ICD-10-CM

## 2018-04-06 DIAGNOSIS — G9050 Complex regional pain syndrome I, unspecified: Secondary | ICD-10-CM

## 2018-04-06 MED ORDER — HYDROXYZINE HCL 25 MG PO TABS
25.00 mg | ORAL_TABLET | Freq: Three times a day (TID) | ORAL | 0 refills | Status: AC | PRN
Start: 2018-04-06 — End: 2018-04-16

## 2018-04-06 MED ORDER — CARISOPRODOL-ASPIRIN 200-325 MG PO TABS
1.0000 | ORAL_TABLET | Freq: Three times a day (TID) | ORAL | 0 refills | Status: DC | PRN
Start: 2018-04-06 — End: 2018-04-17

## 2018-04-07 NOTE — Progress Notes (Signed)
Date of Service: 04/06/2018    SUBJECTIVE:  This is a 47 year old female patient who presents to the clinic today for followup for severe pain in right ankle and heel region.  She previously had a fixed ankle boot; however, the pain has increased since she began wearing this and she would like to return to a fiberglass cast.  Cast was applied without any complications.  She did have ecchymosis noted in and around her heel.  I am concerned that she might have a calcaneal stress fracture.  She still has not gotten approval for her MRI.  Today and will try to repeat her x-ray to see if possibly there is some bone callus present.  Review of the x-ray did not reveal any obvious fractures, however, there was some obscuring of the views due to the cast being present.    PLAN:  for her to return to clinic in 2 weeks.  Hopefully, we will get the MRI approved, so that she can get a more detailed picture of what is going on in the heel.  She continues to have symptoms that are exacerbated by her original RSD following the associated injury several years ago.  Plan is to follow up with her as indicated.  We spent 15 minutes with her, most this time was used to review and discuss her treatment options.    ___________________________  Reviewed and Electronically Signed By: Naoma Diener DPM  Sig Date: 04/08/2018  Sig Time: 22:54:31  Dictated By: Naoma Diener DPM  Dict Date: 04/06/2018 Dict Time: 10 27 PM    Dictation Date and Time:04/06/2018 22:27:08  Transcription Date and Time:04/07/2018 09:06:16  eScription Dictation id: [id number] Confirmation # :[id number]

## 2018-04-10 ENCOUNTER — Ambulatory Visit (HOSPITAL_BASED_OUTPATIENT_CLINIC_OR_DEPARTMENT_OTHER): Payer: Worker's Comp, Other unspecified | Admitting: Podiatrist

## 2018-04-17 ENCOUNTER — Ambulatory Visit: Payer: Self-pay | Admitting: Podiatrist

## 2018-04-17 DIAGNOSIS — S9031XD Contusion of right foot, subsequent encounter: Secondary | ICD-10-CM | POA: Insufficient documentation

## 2018-04-17 DIAGNOSIS — G9050 Complex regional pain syndrome I, unspecified: Secondary | ICD-10-CM | POA: Insufficient documentation

## 2018-04-17 DIAGNOSIS — M84374D Stress fracture, right foot, subsequent encounter for fracture with routine healing: Secondary | ICD-10-CM | POA: Insufficient documentation

## 2018-04-17 MED ORDER — ZOLPIDEM TARTRATE 10 MG PO TABS
10.0000 mg | ORAL_TABLET | Freq: Every evening | ORAL | 0 refills | Status: DC | PRN
Start: 2018-04-17 — End: 2018-05-12

## 2018-04-17 MED ORDER — DIAZEPAM 5 MG PO TABS
ORAL_TABLET | ORAL | 0 refills | Status: DC
Start: 2018-04-17 — End: 2018-05-12

## 2018-04-17 MED ORDER — ZOLPIDEM TARTRATE 10 MG PO TABS: 10 mg | tablet | Freq: Every evening | ORAL | 0 refills | 0 days | Status: AC | PRN

## 2018-04-17 MED ORDER — DIAZEPAM 5 MG PO TABS: tablet | ORAL | 0 refills | 0 days | Status: AC

## 2018-04-17 NOTE — Progress Notes (Signed)
Pt states no change of med list today 04/17/18

## 2018-04-17 NOTE — Progress Notes (Signed)
CC: Chronic right foot pain    Subjective:  Madison Mckay is a 47 year old female who returns to clinic for on-going management of chronic right foot pain. In summary, approximately 8 years ago, a book shelf fell on the patient's right foot while working at [employer]. Since then, she has been having persistent pain, tingling and numbness to the area. She was later diagnosed with CRPS. To date, treatment has consisted of monthly digital nerve blocks to the common peroneal, posterior tibial and anterior tibial nerves with Exparel and Dexamethasone injections. Her most recent injection occurred on 03/27/18. In addition to this, patient was noted to have cuboid stress fracture in May 2019, and was recently diagnosed with potential calcaneal stress fracture in October 2019, all on the right. At her last visit on 04/06/18, she did endorse severe pain to the right heel and had moderate ecchymosis to that area. She was was placed in a full below knee fiberglass cast with instructions to remain NWB to the RLE. Request was submitted to have MRI of the right foot and ankle done to assess for underlying occult pathology, but this has not been approved by insurance.    On returning today, she relates a decrease in pain, rating it 6/10 on the pain scale. She has been taking Ambien and Valium to assist with pain control. In particular, patient reports the cast got went while taking a shower yesterday, and so she removed the cast herself with a bolt cutter. For the remainder of the day, she wore a CAM boot to the RLE, and states this felt more comfortable. Outside of this, she reports doing well and denies any other pedal complaints at this time.    ROS: Patient denies any nausea, vomiting, fever, chills, chest pain and shortness of breath.    Objective:    General: Patient ambulates into clinic wearing [place] sneakers and socks bilaterally. She is AAOx3, in NAD.    Lower Extremity Exam:  Vasc: DP and PT pulses palpable  bilaterally. Right hallux has mild bluish discoloration. Capillary refill time brisk to all digits. Skin temperature gradient within normal limits. No edema noted.  Neuro: Light touch sensation intact bilaterally.  Derm: Skin texture smooth, turgor supple. No open lesions present. No interdigital maceration. Bruising noted to the proximal medial aspect of the right leg. Patient reports this was there prior to placement (04/06/18) and removal (04/16/18) of the below knee fiberglass cast.  Musc: Tenderness to direct palpation of the lateral aspect of the right heel. There is also generalize pain to the dorsal foot over metatarsal shafts 2-5. Ankle and STJ ROM within normal limits. No gross pedal deformities identified. Muscle strength 5/5 to all four lower extremity muscle groups bilaterally.    Assessment:  -Chronic right foot pain  -CRPS, right foot  -Calcaneal stress fracture, right foot    Plan:  47 year old female returning to clinic for evaluation of chronic right foot pain secondary to CRPS and stress fracture to the calcaneus. Patient was examined and with most of her pain localized to the right heel, though it is slightly improved from her visit two weeks ago. As there are no gross osseous abnormalities seen on xrays, we will continue to treat this as a stress fracture of the calcaneus while we await MRI approval. Recommended she continue with immobilization in CAM boot, however, patient would like to attempt WBAT in a regular sneaker. If this causes too much discomfort, she will transition to [facility] in the  CAM on the RLE. She was encouraged to discontinue running or any high impact activities to allow the area to heal.    For more immediate pain control, she was given a local nerve block to the common peroneal, posterior tibial and anterior tibial nerves on the right. Prior to procedure, the correct patient, date of birth and location were verified. Patient was then blocked with amount of injectables as  follows: 3 syringes, each with a mixture of 3.3 cc Exparel and 0.3 cc dexamethasone. She tolerated the procedures well.     She was dispensed a prescription for Ambien 10mg  and Valium 5mg  for continued pain relief. She will continue to monitor her symptoms and return to clinic on prn basis for further evaluation.    Exparel Injection  Part No: [id number]  Lot No: [id number]  Exp: April 2021

## 2018-05-05 DIAGNOSIS — M84376D Stress fracture, unspecified foot, subsequent encounter for fracture with routine healing: Secondary | ICD-10-CM | POA: Insufficient documentation

## 2018-05-05 MED ORDER — DEXAMETHASONE SODIUM PHOSPHATE 4 MG/ML IJ SOLN
2.00 mg | Freq: Once | INTRAMUSCULAR | 0 refills | Status: AC
Start: 2018-05-05 — End: 2018-05-05

## 2018-05-05 MED ORDER — BUPIVACAINE LIPOSOME 1.3 % IJ SUSP: 1 mL | mL | Freq: Once | 0 refills | 0 days | Status: AC

## 2018-05-05 MED ORDER — BUPIVACAINE LIPOSOME 1.3 % IJ SUSP
1.00 mL | Freq: Once | INTRAMUSCULAR | 0 refills | Status: AC
Start: 2018-05-05 — End: 2018-05-05

## 2018-05-05 MED ORDER — DEXAMETHASONE SODIUM PHOSPHATE 4 MG/ML IJ SOLN: 2 mg | mL | Freq: Once | INTRAMUSCULAR | 0 refills | 0 days | Status: AC

## 2018-05-05 NOTE — Progress Notes (Signed)
Patient was seen with resident Dr. Valora CorporalHayman.  I concur with their assessment and treatment plan.  I was present during their examination, and all procedures were performed under my direct supervision, or by me personally.    We spent 15 minutes with this patient, and most of this time was used to discuss treatment options related to her contusion.

## 2018-05-12 ENCOUNTER — Ambulatory Visit: Payer: Self-pay | Attending: Podiatrist | Admitting: Podiatrist

## 2018-05-12 DIAGNOSIS — S9001XD Contusion of right ankle, subsequent encounter: Secondary | ICD-10-CM | POA: Insufficient documentation

## 2018-05-12 DIAGNOSIS — G9050 Complex regional pain syndrome I, unspecified: Secondary | ICD-10-CM | POA: Insufficient documentation

## 2018-05-12 DIAGNOSIS — B351 Tinea unguium: Secondary | ICD-10-CM | POA: Insufficient documentation

## 2018-05-12 MED ORDER — PREDNISONE 10 MG PO TABS: tablet | ORAL | 0 refills | 0 days | Status: AC

## 2018-05-12 MED ORDER — OXYCODONE HCL 5 MG PO TABS
5.00 mg | ORAL_TABLET | Freq: Two times a day (BID) | ORAL | 0 refills | Status: AC | PRN
Start: 2018-05-12 — End: 2018-05-19

## 2018-05-12 MED ORDER — PREDNISONE 10 MG PO TABS
ORAL_TABLET | ORAL | 0 refills | Status: AC
Start: 2018-05-12 — End: 2018-05-22

## 2018-05-12 MED ORDER — ZOLPIDEM TARTRATE ER 6.25 MG PO TBCR: 6 mg | tablet | Freq: Every evening | ORAL | 0 refills | 0 days | Status: AC | PRN

## 2018-05-12 MED ORDER — KETOROLAC TROMETHAMINE 10 MG PO TABS
10.0000 mg | ORAL_TABLET | Freq: Three times a day (TID) | ORAL | 0 refills | Status: DC | PRN
Start: 2018-05-12 — End: 2018-06-09

## 2018-05-12 MED ORDER — DIAZEPAM 5 MG PO TABS
ORAL_TABLET | ORAL | 0 refills | Status: DC
Start: 2018-05-12 — End: 2018-06-09

## 2018-05-12 MED ORDER — ZOLPIDEM TARTRATE ER 6.25 MG PO TBCR
6.2500 mg | EXTENDED_RELEASE_TABLET | Freq: Every evening | ORAL | 0 refills | Status: DC | PRN
Start: 2018-05-12 — End: 2018-07-10

## 2018-05-12 MED ORDER — OXYCODONE HCL 5 MG PO TABS: 5 mg | tablet | Freq: Two times a day (BID) | ORAL | 0 refills | 0 days | Status: AC | PRN

## 2018-05-12 MED ORDER — DIAZEPAM 5 MG PO TABS: tablet | ORAL | 0 refills | 0 days | Status: AC

## 2018-05-12 MED ORDER — KETOROLAC TROMETHAMINE 10 MG PO TABS: 10 mg | tablet | Freq: Three times a day (TID) | ORAL | 0 refills | 0 days | Status: AC | PRN

## 2018-05-12 MED ORDER — TERBINAFINE HCL 250 MG PO TABS
250.00 mg | ORAL_TABLET | Freq: Every day | ORAL | 2 refills | Status: AC
Start: 2018-05-12 — End: 2018-08-10

## 2018-05-12 MED ORDER — TERBINAFINE HCL 250 MG PO TABS: 250 mg | tablet | Freq: Every day | ORAL | 2 refills | 0 days | Status: AC

## 2018-05-12 NOTE — Progress Notes (Signed)
In accordance with the opiate bill enacted on 08/19/14, the patient's condition is such that a non-opiate medication was not appropriate and:    1. The treatment of the condition associated with the prescribing of an opiate/opioid medication is due to addiction treatment, acute condition, chronic pain, cancer, or palliative care.   2. The patient's medical team has evaluated the patient's current condition, risk factors, history of substance abuse, if any, and current medications.  3. The patient's medical team has informed the patient that the prescribed medication is an appropriate course of treatment based on the medical need of the patient.   4. The patient's medical team has consulted with the patient regarding the quantity of the opiate prescribed and informed the patient of his right to fill the prescription in a lesser quantity.  5. The patient's medical team has expressly informed the patient of the risks associated with the opiate prescribed.  6. If this patient is prescribed an extended-release opiate, the patient has signed a Controlled Substance Agreement with (his/her) medical team.  7. I or an authorized delegate have checked the Prescription Monitoring Program ([place]) for this patient.       Rx for oral Lamisil for onychomycosis also given.  We discussed there associated risks.      We spent 15 minutes with this patient, and most of this time was used to discuss treatment options.

## 2018-05-12 NOTE — Progress Notes (Signed)
Date of Service: 05/12/2018    SUBJECTIVE:  The patient presents to the clinic today for followup for two issues.  The first is persistent pain in her right foot and lower leg secondary to RSD.  She continues to have intermittent pain, which is slightly improved since her last visit but remains significant for her.  Previously, we treated her with Exparel injections to the nerves of the lower extremity.  Today, we repeated the injections utilizing approximately 3 mL of Exparel and 0.33 mL of dexamethasone 4 mg/mL, administered to the common peroneal, posterior tibial, and lateral nerve.  We also did an ankle block with her.  She tolerated the injections well without any complications noted.  Her second concern is contusion to her medial ankle.  She continues to have edema and erythema in this area.  There are no breaks in the skin.  She continues to wait for approval for an MRI, which I feel is essential in order to assess the soft tissues.  Previous assessment of bony abnormalities on radiograph has been negative.  She reports that it is difficult to find anything uncomfortable.  She is currently wearing a high top boot in order to give her some support and she reports this is the most comfortable option.  She is not able to tolerate a fixed ankle boot and is not able to tolerate a cast.    REVIEW OF SYSTEMS:  She denies fever, chills, nausea, vomiting, night sweats, shortness of breath, and calf pain.    OBJECTIVE:  She has palpable DP and PT pulses bilaterally.  She has significant abnormal distal cooling consistent with RSD.  This was resolved once the injections were administered.    We spent 15 minutes with her today to discuss her medial right ankle.  Most this time was used to review and discuss her treatment options.    ___________________________  Reviewed and Electronically Signed By: Naoma DienerADAM S [employer] DPM  Sig Date: 05/13/2018  Sig Time: 17:55:58  Dictated By: Naoma DienerADAM S Myah [employer]ynes DPM  Dict Date: 05/12/2018  Dict Time: 04 41 PM    Dictation Date and Time:05/12/2018 16:41:50  Transcription Date and Time:05/12/2018 21:13:34  eScription Dictation id: [id number] Confirmation # :[id number]

## 2018-05-16 DIAGNOSIS — B351 Tinea unguium: Secondary | ICD-10-CM | POA: Insufficient documentation

## 2018-05-16 MED ORDER — DEXAMETHASONE SODIUM PHOSPHATE 4 MG/ML IJ SOLN: 2 mg | mL | Freq: Once | INTRAMUSCULAR | 0 refills | 0 days | Status: AC

## 2018-05-16 MED ORDER — BUPIVACAINE LIPOSOME 1.3 % IJ SUSP: 1 mL | mL | Freq: Once | 0 refills | 0 days | Status: AC

## 2018-05-16 MED ORDER — DEXAMETHASONE SODIUM PHOSPHATE 4 MG/ML IJ SOLN
2.00 mg | Freq: Once | INTRAMUSCULAR | 0 refills | Status: AC
Start: 2018-05-16 — End: 2018-05-16

## 2018-05-16 MED ORDER — BUPIVACAINE LIPOSOME 1.3 % IJ SUSP
1.00 mL | Freq: Once | INTRAMUSCULAR | 0 refills | Status: AC
Start: 2018-05-16 — End: 2018-05-16
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
## 2018-06-09 ENCOUNTER — Ambulatory Visit: Payer: Self-pay | Admitting: Podiatrist

## 2018-06-09 DIAGNOSIS — G9050 Complex regional pain syndrome I, unspecified: Secondary | ICD-10-CM | POA: Insufficient documentation

## 2018-06-09 MED ORDER — KETOROLAC TROMETHAMINE 10 MG PO TABS: 10 mg | tablet | Freq: Four times a day (QID) | ORAL | 0 refills | 0 days | Status: AC | PRN

## 2018-06-09 MED ORDER — DIAZEPAM 5 MG PO TABS
ORAL_TABLET | ORAL | 0 refills | Status: DC
Start: 2018-06-09 — End: 2018-07-10

## 2018-06-09 MED ORDER — DIAZEPAM 5 MG PO TABS: tablet | ORAL | 0 refills | 0 days | Status: AC

## 2018-06-09 MED ORDER — KETOROLAC TROMETHAMINE 10 MG PO TABS
10.0000 mg | ORAL_TABLET | Freq: Four times a day (QID) | ORAL | 0 refills | Status: DC | PRN
Start: 2018-06-09 — End: 2018-09-08

## 2018-06-12 NOTE — Progress Notes (Signed)
Date of Service: 06/12/2018    SUBJECTIVE:  This is a 48 year old female patient who returns to the clinic today for ongoing treatment for reflex sympathetic dystrophy in her right foot and lower leg, as well as pain in her medial right ankle region.  Her pain appears to have improved significantly in her right medial ankle region.  However, she is still getting a burning pain and tingling in her right foot and lower leg.  She does have some slight pallor to the color of her right foot today and the foot is cool to touch as compared to the contralateral limb.    ASSESSMENT:  I recommended that we repeat her Exparel injection today, which were administered without any complications consisting of approximately 3.5 mL of Exparel  and 0.5 mL of dexamethasone 4 mg/mL to the posterior tibial, the common peroneal, and to her right ankle.  She tolerated the 3 site injections well without any complications noted.      PLAN:  Plan is for her to follow up with [place] on an as needed basis in 3 to 4 weeks.    ___________________________  Reviewed and Electronically Signed By: Naoma Diener DPM  Sig Date: 06/13/2018  Sig Time: 12:04:43  Dictated By: Naoma Diener DPM  Dict Date: 06/12/2018 Dict Time: 12 57 PM    Dictation Date and Time:06/12/2018 12:57:38  Transcription Date and Time:06/12/2018 13:17:03  eScription Dictation id: [id number] Confirmation # :[id number]

## 2018-06-16 MED ORDER — BUPIVACAINE LIPOSOME 1.3 % IJ SUSP
1.00 mL | Freq: Once | INTRAMUSCULAR | 0 refills | Status: AC
Start: 2018-06-16 — End: 2018-06-16

## 2018-06-16 MED ORDER — DEXAMETHASONE SODIUM PHOSPHATE 4 MG/ML IJ SOLN
2.00 mg | Freq: Once | INTRAMUSCULAR | 0 refills | Status: AC
Start: 2018-06-16 — End: 2018-06-16

## 2018-06-16 MED ORDER — DEXAMETHASONE SODIUM PHOSPHATE 4 MG/ML IJ SOLN: 2 mg | mL | Freq: Once | INTRAMUSCULAR | 0 refills | 0 days | Status: AC

## 2018-06-16 MED ORDER — BUPIVACAINE LIPOSOME 1.3 % IJ SUSP: 1 mL | mL | Freq: Once | 0 refills | 0 days | Status: AC

## 2018-07-10 ENCOUNTER — Ambulatory Visit: Payer: Self-pay | Admitting: Podiatrist

## 2018-07-10 DIAGNOSIS — S9001XD Contusion of right ankle, subsequent encounter: Secondary | ICD-10-CM

## 2018-07-10 DIAGNOSIS — G9050 Complex regional pain syndrome I, unspecified: Secondary | ICD-10-CM

## 2018-07-10 MED ORDER — DIAZEPAM 5 MG PO TABS
ORAL_TABLET | ORAL | 0 refills | Status: DC
Start: 2018-07-10 — End: 2018-08-14

## 2018-07-10 MED ORDER — ZOLPIDEM TARTRATE ER 6.25 MG PO TBCR
6.2500 mg | EXTENDED_RELEASE_TABLET | Freq: Every evening | ORAL | 0 refills | Status: DC | PRN
Start: 2018-07-10 — End: 2018-08-14

## 2018-07-10 MED ORDER — DIAZEPAM 5 MG PO TABS: tablet | ORAL | 0 refills | 0 days | Status: AC

## 2018-07-10 MED ORDER — ZOLPIDEM TARTRATE ER 6.25 MG PO TBCR: 6 mg | tablet | Freq: Every evening | ORAL | 0 refills | 0 days | Status: AC | PRN

## 2018-07-10 NOTE — Progress Notes (Signed)
In accordance with the opiate bill enacted on 08/19/14, the patient's condition is such that a non-opiate medication was not appropriate and:    1. The treatment of the condition associated with the prescribing of an opiate/opioid medication is due to addiction treatment, acute condition, chronic pain, cancer, or palliative care.    2. The patient's medical team has evaluated the patient’s current condition, risk factors, history of substance abuse, if any, and current medications.  3. The patient's medical team has informed the patient that the prescribed medication is an appropriate course of treatment based on the medical need of the patient.    4. The patient's medical team has consulted with the patient regarding the quantity of the opiate prescribed and informed the patient of his right to fill the prescription in a lesser quantity.  5. The patient's medical team has expressly informed the patient of the risks associated with the opiate prescribed.  6. If this patient is prescribed an extended-release opiate, the patient has signed a Controlled Substance Agreement with [his/her] medical team.  7. I or an authorized delegate have checked the Prescription Monitoring Program ([place]) for this patient.

## 2018-07-10 NOTE — Progress Notes (Signed)
Date of Service: 07/10/2018    SUBJECTIVE:  This is a 48 year old female patient who returns to the clinic today for ongoing issues of reflux sympathetic dystrophy in her right foot and leg.  She has foot which is cold with mottled skin appearance on the entire right foot beginning at the ankle and extending all the way to the tips of the toes.  She also has continued and persistent pain in her medial right ankle region adjacent to the medial malleolus.  She still has not been cleared for an MRI as requested.    PAST MEDICAL HISTORY:  Reviewed and is otherwise unchanged.    REVIEW OF SYSTEMS:  She denies fever, chills, nausea, vomiting, night sweats, shortness of breath, and calf pain.    OBJECTIVE:  She has palpable DP and PT pulses bilaterally.  Sensorium is grossly intact.  She has 5/5 muscle strength bilaterally.  She has a right foot which is noticeably cooler than the left, consistent with reflex sympathetic dystrophy.  She does have easily palpated dorsalis pedis, posterior tibial pulses.  Her skin is blanched and pale.  Foot is cool to the touch.    ASSESSMENT AND PLAN:  Based on clinical examination today, I have recommended the following course of action:  1.  We did nerve blocks for her reflex sympathetic dystrophy with Exparel with 4.66 mL of Exparel and 0.33 mL of dexamethasone 4 mg/mL.  She tolerated the 3 blocks to the posterior tibia, the ankle, and the common peroneal regions on her right foot.  In addition, she has pain within the ankle joint itself and we administered a 0.5 mL cortisone cocktail to this area consisting of 0.5 mL of Kenalog 10, 0.5 mL of dexamethasone 4 mg/mL, and 1 mL of plain lidocaine.  She tolerated the injection well without any complications noted.  I did advise her of the risks and complications associated with joint injection.  She indicates she understands and wanted to proceed.  We also renewed her prescription for Ambien and  diazepam.    ___________________________  Reviewed and Electronically Signed By: Naoma Diener DPM  Sig Date: 07/11/2018  Sig Time: 15:40:13  Dictated By: Naoma Diener DPM  Dict Date: 07/10/2018 Dict Time: 03 23 PM    Dictation Date and Time:07/10/2018 15:23:44  Transcription Date and Time:07/10/2018 23:16:42  eScription Dictation id: [id number] Confirmation # :[id number]

## 2018-07-12 MED ORDER — BUPIVACAINE LIPOSOME 1.3 % IJ SUSP
1.00 mL | Freq: Once | INTRAMUSCULAR | 0 refills | Status: AC
Start: 2018-07-12 — End: 2018-07-12

## 2018-07-12 MED ORDER — DEXAMETHASONE SODIUM PHOSPHATE 4 MG/ML IJ SOLN
2.00 mg | Freq: Once | INTRAMUSCULAR | 0 refills | Status: AC
Start: 2018-07-12 — End: 2018-07-12

## 2018-07-12 MED ORDER — DEXAMETHASONE SODIUM PHOSPHATE 4 MG/ML IJ SOLN: 2 mg | mL | Freq: Once | INTRAMUSCULAR | 0 refills | 0 days | Status: AC

## 2018-07-12 MED ORDER — BUPIVACAINE LIPOSOME 1.3 % IJ SUSP: 1 mL | mL | Freq: Once | 0 refills | 0 days | Status: AC

## 2018-08-08 ENCOUNTER — Other Ambulatory Visit (HOSPITAL_BASED_OUTPATIENT_CLINIC_OR_DEPARTMENT_OTHER): Payer: Self-pay | Admitting: Podiatrist

## 2018-08-14 ENCOUNTER — Ambulatory Visit: Payer: Self-pay | Attending: Podiatrist | Admitting: Podiatrist

## 2018-08-14 DIAGNOSIS — G9050 Complex regional pain syndrome I, unspecified: Secondary | ICD-10-CM | POA: Insufficient documentation

## 2018-08-14 MED ORDER — DIAZEPAM 5 MG PO TABS
ORAL_TABLET | ORAL | 0 refills | Status: DC
Start: 2018-08-14 — End: 2018-09-08

## 2018-08-14 MED ORDER — ZOLPIDEM TARTRATE ER 6.25 MG PO TBCR: 6 mg | tablet | Freq: Every evening | ORAL | 0 refills | 0 days | Status: AC | PRN

## 2018-08-14 MED ORDER — ZOLPIDEM TARTRATE ER 6.25 MG PO TBCR
6.2500 mg | EXTENDED_RELEASE_TABLET | Freq: Every evening | ORAL | 0 refills | Status: DC | PRN
Start: 2018-08-14 — End: 2018-09-08

## 2018-08-14 MED ORDER — DIAZEPAM 5 MG PO TABS: tablet | ORAL | 0 refills | 0 days | Status: AC

## 2018-08-14 NOTE — Progress Notes (Signed)
CC: Chronic right foot pain    Subjective:  Madison Mckay is a 48 year old female who returns to clinic for ongoing issues of reflux sympathetic dystrophy in her right foot and leg.  Treatment has consisted of monthly digital nerve blocks to the common peroneal, posterior tibial and anterior tibial nerves with Exparel and Dexamethasone injections.  Her most recent injection was 07/10/18.  She also has continued and persistent pain in her medial right ankle region adjacent to the medial malleolus.  She relates a decrease in pain, rating it 6/10 on the pain scale.  She has been taking valium and ambien for pain relief.  She still has not been cleared for an MRI as requested.    Review of Systems:  Constitutional: Negative for fever and chills.  Respiratory: Negative for shortness of breath.    Gastrointestinal: Negative for nausea and vomiting.   Cardiovascular: Negative for chest pain, claudication, leg swelling.   Neurologic: Negative headache.  Skin: Negative for new rashes or lesions.  Musculoskeletal: Positive for pain RLE.       Objective:  Patient ambulates into clinic wearing [place] sneakers without assistance. She is AAOx3 and in NAD.    Lower Extremity Exam:  Vasc: DP and PT pulses palpable bilaterally. Capillary refill time brisk to all digits. Skin temperature cool on the RLE. No edema noted.  Neuro: Light touch sensation intact bilaterally.  Derm: Skin texture smooth, turgor supple, pale in color. No open lesions present.   Musc: There is also generalize pain to the RLE. No gross pedal deformities identified. Muscle strength 5/5 to all four lower extremity muscle groups bilaterally.    Assessment:  -Chronic right foot pain  -CRPS, right foot    Plan:  48 year old female returning to clinic for evaluation of chronic right foot pain secondary to CRPS. For pain control, she was given a local nerve block to the common peroneal, posterior tibial and anterior tibial nerves on the right. Prior to procedure, the  correct patient, date of birth and location were verified.  We did nerve blocks for her reflex sympathetic dystrophy with Exparel with 4.66 mL of Exparel and 0.33 mL of dexamethasone 4 mg/mL.  She tolerated the 3 blocks to the posterior tibia, the ankle, and the common peroneal regions on her right foot.  She tolerated the injection well without any complications noted.  I did advise her of the risks and complications associated with joint injection.  She indicates she understands and wanted to proceed.  We also renewed her prescription for Ambien and diazepam.  She will continue to monitor her symptoms and return to clinic on prn basis for further evaluation.    Exparel Injection  Part No: [id number]  Lot No: [id number]  Exp: SEP 2021

## 2018-08-20 MED ORDER — BUPIVACAINE LIPOSOME 1.3 % IJ SUSP: 1 mL | mL | Freq: Once | 0 refills | 0 days | Status: AC

## 2018-08-20 MED ORDER — BUPIVACAINE LIPOSOME 1.3 % IJ SUSP
1.00 mL | Freq: Once | INTRAMUSCULAR | 0 refills | Status: AC
Start: 2018-08-20 — End: 2018-08-20

## 2018-08-20 MED ORDER — DEXAMETHASONE SODIUM PHOSPHATE 4 MG/ML IJ SOLN: 2 mg | mL | Freq: Once | INTRAMUSCULAR | 0 refills | 0 days | Status: AC

## 2018-08-20 MED ORDER — DEXAMETHASONE SODIUM PHOSPHATE 4 MG/ML IJ SOLN
2.00 mg | Freq: Once | INTRAMUSCULAR | 0 refills | Status: AC
Start: 2018-08-20 — End: 2018-08-20

## 2018-08-20 NOTE — Progress Notes (Signed)
Patient was seen with resident Dr. Seidel.  I concur with their assessment and treatment plan.  I was present during their examination, and all procedures were performed under my direct supervision, or by me personally.    We spent 15 minutes with this patient, and most of this time was used to discuss treatment options.

## 2018-09-04 ENCOUNTER — Ambulatory Visit (HOSPITAL_BASED_OUTPATIENT_CLINIC_OR_DEPARTMENT_OTHER): Payer: Worker's Comp, Other unspecified | Admitting: Podiatrist

## 2018-09-08 ENCOUNTER — Other Ambulatory Visit: Payer: Self-pay

## 2018-09-08 ENCOUNTER — Ambulatory Visit: Payer: Self-pay | Admitting: Podiatrist

## 2018-09-08 DIAGNOSIS — G9050 Complex regional pain syndrome I, unspecified: Secondary | ICD-10-CM | POA: Insufficient documentation

## 2018-09-08 DIAGNOSIS — M79671 Pain in right foot: Secondary | ICD-10-CM | POA: Insufficient documentation

## 2018-09-08 MED ORDER — ZOLPIDEM TARTRATE 5 MG PO TABS: 5 mg | tablet | Freq: Every evening | ORAL | 0 refills | 0 days | Status: AC | PRN

## 2018-09-08 MED ORDER — BUPIVACAINE LIPOSOME 1.3 % IJ SUSP
1.00 mL | Freq: Once | INTRAMUSCULAR | 0 refills | Status: AC
Start: 2018-09-08 — End: 2018-09-08

## 2018-09-08 MED ORDER — KETOROLAC TROMETHAMINE 10 MG PO TABS
10.00 mg | ORAL_TABLET | Freq: Four times a day (QID) | ORAL | 0 refills | Status: AC | PRN
Start: 2018-09-08 — End: 2018-09-13

## 2018-09-08 MED ORDER — DEXAMETHASONE SODIUM PHOSPHATE 4 MG/ML IJ SOLN
2.00 mg | Freq: Once | INTRAMUSCULAR | 0 refills | Status: AC
Start: 2018-09-08 — End: 2018-09-08

## 2018-09-08 MED ORDER — ZOLPIDEM TARTRATE 5 MG PO TABS
5.00 mg | ORAL_TABLET | Freq: Every evening | ORAL | 0 refills | Status: AC | PRN
Start: 2018-09-08 — End: 2018-10-08

## 2018-09-08 MED ORDER — KETOROLAC TROMETHAMINE 10 MG PO TABS: 10 mg | tablet | Freq: Four times a day (QID) | ORAL | 0 refills | 0 days | Status: AC | PRN

## 2018-09-08 MED ORDER — DIAZEPAM 5 MG PO TABS: tablet | ORAL | 0 refills | 0 days | Status: AC

## 2018-09-08 MED ORDER — DEXAMETHASONE SODIUM PHOSPHATE 4 MG/ML IJ SOLN: 2 mg | mL | Freq: Once | INTRAMUSCULAR | 0 refills | 0 days | Status: AC

## 2018-09-08 MED ORDER — BUPIVACAINE LIPOSOME 1.3 % IJ SUSP: 1 mL | mL | Freq: Once | 0 refills | 0 days | Status: AC

## 2018-09-08 MED ORDER — DIAZEPAM 5 MG PO TABS
ORAL_TABLET | ORAL | 0 refills | Status: AC
Start: 2018-09-08 — End: 2018-10-08

## 2018-09-08 NOTE — Progress Notes (Signed)
Date of Service: 09/08/2018    SUBJECTIVE:  This is a 48 year old female patient who presents to the clinic today with severe pain in her right foot.  She has a prior history of reflex sympathetic dystrophy and has required periodic injections for this condition approximately once a month for at least the last 8 years.  She presents to the clinic today on an urgent basis.  We did discuss prior to her arriving, the need for protective gear including a mask, which we supplied to her upon presentation.  She reports that she has not had any symptoms of pandemic and denies fever, chills, nausea, vomiting, night sweats, shortness of breath, and calf pain.  She reports that her foot has gotten severely painful over the last 2-3 days and she would prefer not to utilize narcotic analgesics.  After discussion with her about essential protective measures she was seen today in our clinic.  We administered nerve blocks to her posterior tibial nerve, her peroneal nerve and her common peroneal nerve.  Each injection consisted of 3.5 mL of Exparel and 1/3 of 1 mL of dexamethasone 4 mg/mL.  She tolerated the 3 nerve blocks without any complications noted.      PLAN: Is for her to continue to followup with [place] as needed.  We will try to continue her regional nerve block injections for her and work around the COVID restrictions as best we can.  We also renewed her prescriptions for Toradol and we changed her from a long-acting to a short-acting Ambien and also gave her a prescription for Valium. This combination of medications has helped to reduce her stress and help to treat her for her reflex sympathetic dystrophy.  Will followup with her as indicated.      We spent 15 minutes with her today.  Most this time was used to review and discuss her treatment options.  We did notice an immediate improvement in the coloration and temperature of her foot following injections.    ___________________________  Reviewed and Electronically Signed  By: Naoma Diener DPM  Sig Date: 09/08/2018  Sig Time: 21:10:21  Dictated By: Naoma Diener DPM  Dict Date: 09/08/2018 Dict Time: 02 22 PM    Dictation Date and Time:09/08/2018 14:22:12  Transcription Date and Time:09/08/2018 15:56:34  eScription Dictation id: [id number] Confirmation # :[id number]

## 2018-09-08 NOTE — Progress Notes (Signed)
This office note has been dictated. Account number Data Unavailable

## 2018-10-13 ENCOUNTER — Telehealth (HOSPITAL_BASED_OUTPATIENT_CLINIC_OR_DEPARTMENT_OTHER): Payer: Self-pay | Admitting: Family Medicine

## 2018-10-13 DIAGNOSIS — Z20828 Contact with and (suspected) exposure to other viral communicable diseases: Secondary | ICD-10-CM

## 2018-10-13 DIAGNOSIS — Z20822 Contact with and (suspected) exposure to covid-19: Secondary | ICD-10-CM

## 2018-10-13 NOTE — Progress Notes (Signed)
covid 19 test

## 2018-10-20 ENCOUNTER — Ambulatory Visit
Admission: RE | Admit: 2018-10-20 | Discharge: 2018-10-20 | Disposition: A | Discharge status: Home / Self Care | Payer: 59 | Attending: Family Medicine | Admitting: Family Medicine

## 2018-10-20 DIAGNOSIS — Z20828 Contact with and (suspected) exposure to other viral communicable diseases: Secondary | ICD-10-CM | POA: Diagnosis present

## 2018-10-20 DIAGNOSIS — Z20822 Contact with and (suspected) exposure to covid-19: Secondary | ICD-10-CM

## 2018-10-22 LAB — COVID-19 OUTPATIENT: COVID-19 OUTPATIENT: NEGATIVE

## 2018-11-09 ENCOUNTER — Other Ambulatory Visit (HOSPITAL_BASED_OUTPATIENT_CLINIC_OR_DEPARTMENT_OTHER): Payer: Self-pay | Admitting: Podiatrist

## 2019-01-11 ENCOUNTER — Telehealth (HOSPITAL_BASED_OUTPATIENT_CLINIC_OR_DEPARTMENT_OTHER): Payer: Self-pay | Admitting: Family Medicine

## 2019-01-11 DIAGNOSIS — Z20822 Contact with and (suspected) exposure to covid-19: Secondary | ICD-10-CM

## 2019-01-11 DIAGNOSIS — Z20828 Contact with and (suspected) exposure to other viral communicable diseases: Secondary | ICD-10-CM

## 2019-01-11 NOTE — Progress Notes (Signed)
COVID19 TEST

## 2019-01-26 ENCOUNTER — Ambulatory Visit
Admission: RE | Admit: 2019-01-26 | Discharge: 2019-01-26 | Disposition: A | Discharge status: Home / Self Care | Payer: 59 | Attending: Family Medicine | Admitting: Family Medicine

## 2019-01-26 DIAGNOSIS — Z20828 Contact with and (suspected) exposure to other viral communicable diseases: Secondary | ICD-10-CM | POA: Diagnosis present

## 2019-01-26 DIAGNOSIS — Z20822 Contact with and (suspected) exposure to covid-19: Secondary | ICD-10-CM

## 2019-01-26 LAB — COVID-19 OUTPATIENT: COVID-19 OUTPATIENT: NEGATIVE

## 2019-02-27 ENCOUNTER — Telehealth (HOSPITAL_BASED_OUTPATIENT_CLINIC_OR_DEPARTMENT_OTHER): Payer: Self-pay | Admitting: Family Medicine

## 2019-02-27 DIAGNOSIS — Z20822 Contact with and (suspected) exposure to covid-19: Secondary | ICD-10-CM

## 2019-02-27 DIAGNOSIS — Z20828 Contact with and (suspected) exposure to other viral communicable diseases: Secondary | ICD-10-CM

## 2019-02-27 NOTE — Progress Notes (Signed)
COVID-19

## 2019-03-06 ENCOUNTER — Ambulatory Visit
Admission: RE | Admit: 2019-03-06 | Discharge: 2019-03-06 | Disposition: A | Discharge status: Home / Self Care | Payer: 59 | Attending: Family Medicine | Admitting: Family Medicine

## 2019-03-06 DIAGNOSIS — Z20822 Contact with and (suspected) exposure to covid-19: Secondary | ICD-10-CM

## 2019-03-06 DIAGNOSIS — Z20828 Contact with and (suspected) exposure to other viral communicable diseases: Secondary | ICD-10-CM | POA: Diagnosis present

## 2019-03-07 LAB — COVID-19 OUTPATIENT: COVID-19 OUTPATIENT: NEGATIVE

## 2019-03-28 ENCOUNTER — Telehealth (HOSPITAL_BASED_OUTPATIENT_CLINIC_OR_DEPARTMENT_OTHER): Payer: Self-pay | Admitting: Family Medicine

## 2019-03-28 DIAGNOSIS — Z20828 Contact with and (suspected) exposure to other viral communicable diseases: Secondary | ICD-10-CM

## 2019-03-28 DIAGNOSIS — Z20822 Contact with and (suspected) exposure to covid-19: Secondary | ICD-10-CM

## 2019-03-28 NOTE — Telephone Encounter (Signed)
COVID-19 Testing

## 2019-04-03 ENCOUNTER — Ambulatory Visit
Admission: RE | Admit: 2019-04-03 | Discharge: 2019-04-03 | Disposition: A | Discharge status: Home / Self Care | Payer: 59 | Attending: Family Medicine | Admitting: Family Medicine

## 2019-04-03 DIAGNOSIS — Z20828 Contact with and (suspected) exposure to other viral communicable diseases: Secondary | ICD-10-CM | POA: Diagnosis present

## 2019-04-03 DIAGNOSIS — Z20822 Contact with and (suspected) exposure to covid-19: Secondary | ICD-10-CM

## 2019-04-03 LAB — COVID-19 OUTPATIENT: COVID-19 OUTPATIENT: NEGATIVE

## 2019-04-04 ENCOUNTER — Encounter (HOSPITAL_BASED_OUTPATIENT_CLINIC_OR_DEPARTMENT_OTHER): Payer: Self-pay

## 2019-04-04 ENCOUNTER — Telehealth (HOSPITAL_BASED_OUTPATIENT_CLINIC_OR_DEPARTMENT_OTHER): Payer: Self-pay | Admitting: Family Medicine

## 2019-04-04 DIAGNOSIS — Z20828 Contact with and (suspected) exposure to other viral communicable diseases: Secondary | ICD-10-CM

## 2019-04-04 DIAGNOSIS — Z20822 Contact with and (suspected) exposure to covid-19: Secondary | ICD-10-CM

## 2019-04-04 NOTE — Telephone Encounter (Signed)
COVID-19 Testing

## 2019-04-16 ENCOUNTER — Ambulatory Visit
Admission: RE | Admit: 2019-04-16 | Discharge: 2019-04-16 | Disposition: A | Discharge status: Home / Self Care | Payer: 59 | Attending: Family Medicine | Admitting: Family Medicine

## 2019-04-16 DIAGNOSIS — Z20822 Contact with and (suspected) exposure to covid-19: Secondary | ICD-10-CM

## 2019-04-16 DIAGNOSIS — Z20828 Contact with and (suspected) exposure to other viral communicable diseases: Secondary | ICD-10-CM | POA: Diagnosis present

## 2019-04-16 LAB — COVID-19 OUTPATIENT: COVID-19 OUTPATIENT: NEGATIVE

## 2019-04-17 ENCOUNTER — Encounter (HOSPITAL_BASED_OUTPATIENT_CLINIC_OR_DEPARTMENT_OTHER): Payer: Self-pay

## 2019-05-01 ENCOUNTER — Telehealth (HOSPITAL_BASED_OUTPATIENT_CLINIC_OR_DEPARTMENT_OTHER): Payer: Self-pay | Admitting: Family Medicine

## 2019-05-01 DIAGNOSIS — Z20828 Contact with and (suspected) exposure to other viral communicable diseases: Secondary | ICD-10-CM

## 2019-05-01 DIAGNOSIS — Z20822 Contact with and (suspected) exposure to covid-19: Secondary | ICD-10-CM

## 2019-05-01 NOTE — Telephone Encounter (Signed)
COVID-19 Testing

## 2019-05-04 ENCOUNTER — Ambulatory Visit
Admission: RE | Admit: 2019-05-04 | Discharge: 2019-05-04 | Disposition: A | Discharge status: Home / Self Care | Payer: 59 | Attending: Family Medicine | Admitting: Family Medicine

## 2019-05-04 DIAGNOSIS — Z20822 Contact with and (suspected) exposure to covid-19: Secondary | ICD-10-CM

## 2019-05-04 DIAGNOSIS — Z20828 Contact with and (suspected) exposure to other viral communicable diseases: Secondary | ICD-10-CM | POA: Diagnosis not present

## 2019-05-04 LAB — COVID-19 OUTPATIENT: COVID-19 OUTPATIENT: NEGATIVE

## 2019-05-07 ENCOUNTER — Encounter (HOSPITAL_BASED_OUTPATIENT_CLINIC_OR_DEPARTMENT_OTHER): Payer: Self-pay

## 2019-09-05 ENCOUNTER — Ambulatory Visit: Payer: 59 | Attending: Internal Medicine

## 2019-09-05 ENCOUNTER — Other Ambulatory Visit: Payer: Self-pay

## 2019-09-05 DIAGNOSIS — Z23 Encounter for immunization: Secondary | ICD-10-CM | POA: Diagnosis not present

## 2019-09-26 ENCOUNTER — Ambulatory Visit: Payer: 59 | Attending: Internal Medicine

## 2019-09-26 ENCOUNTER — Other Ambulatory Visit: Payer: Self-pay

## 2019-09-26 DIAGNOSIS — Z23 Encounter for immunization: Secondary | ICD-10-CM | POA: Diagnosis present
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
## 2020-01-24 IMAGING — MR MRI CERVICAL SPINE WITHOUT CONTRAST
6 series · 42 of 48 positions shown · non-contrast
Comparison: none

﻿Pertinent Hx:  Neck and back pain.  Bilateral hand numbness.  Headaches.
TECHNIQUE: Sagittal images with T1, T2, and STIR weighting are performed through the cervical spine. Axial images with T1 and T2 weighting are performed through the C2-3, C3-4, C4-5, C5-6, C6-7, and C7-T1 disc spaces.

[Series 2: T2 · sagittal · 3.0mm · 0.47mm/px · 7 of 15 slices shown (1 of 3)]
[im 1/15]
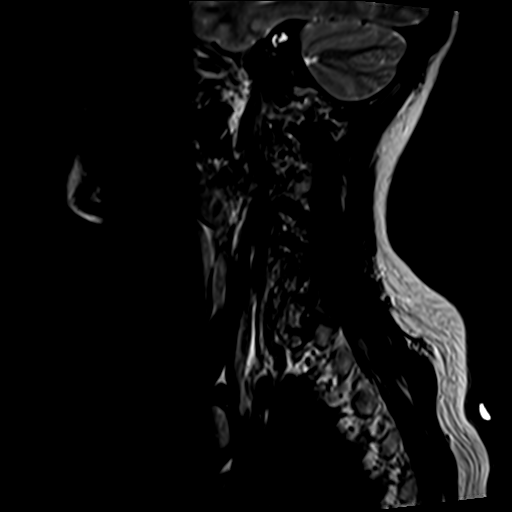
[im 3/15]
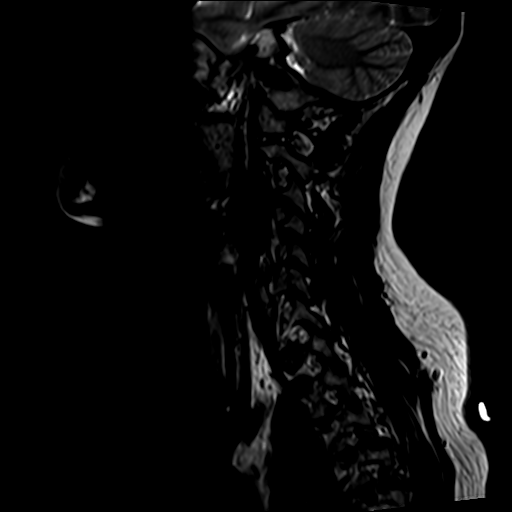
[im 5/15]
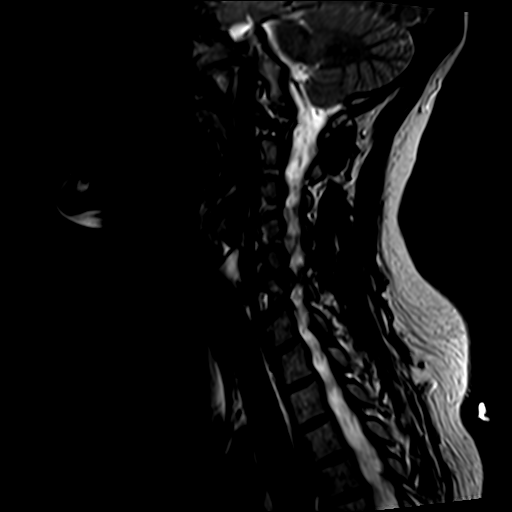
[im 8/15]
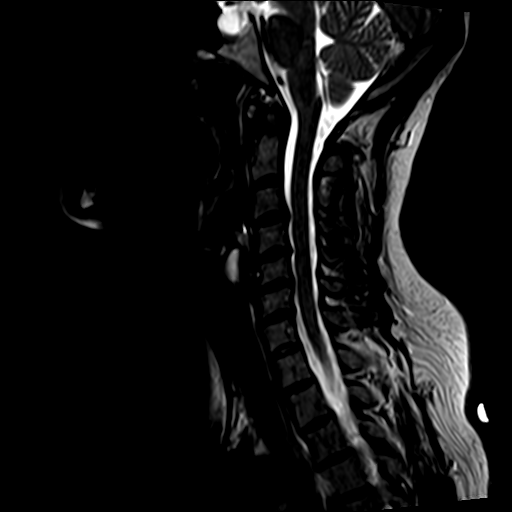
[im 10/15]
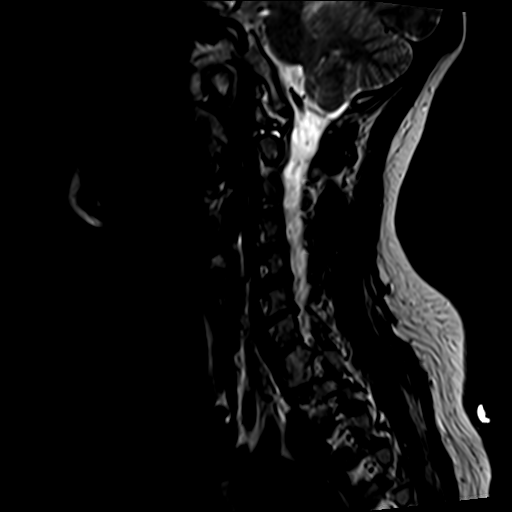
[im 12/15]
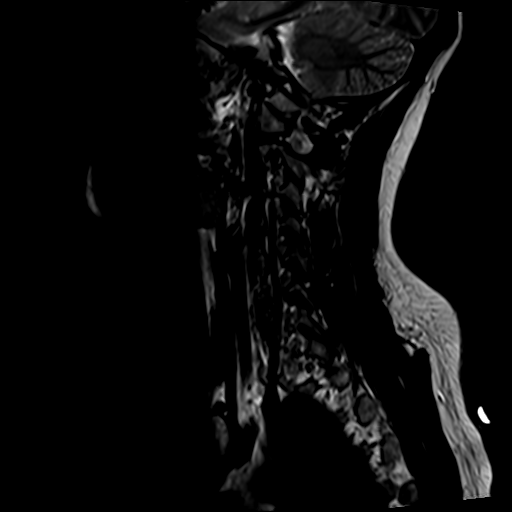
[im 15/15]
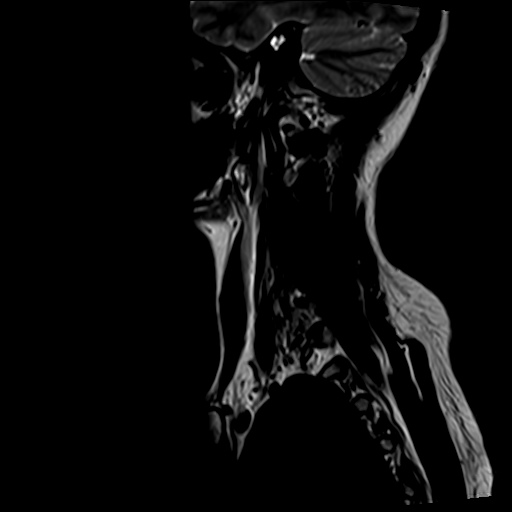

[Series 3: T1 · sagittal · 3.0mm · 0.94mm/px · 7 of 15 slices shown (1 of 2)]
[im 1/15]
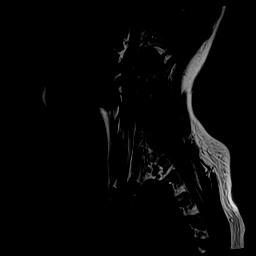
[im 3/15]
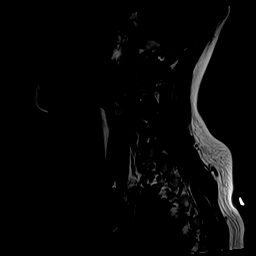
[im 5/15]
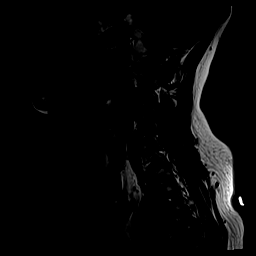
[im 8/15]
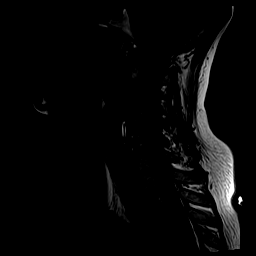
[im 10/15]
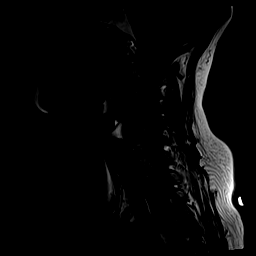
[im 12/15]
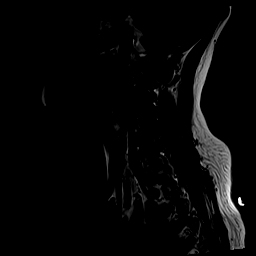
[im 15/15]
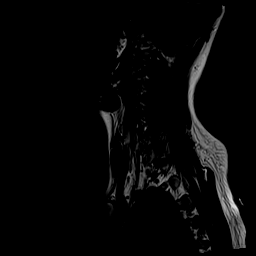

[Series 4: T2 · axial · 4.0mm · 0.59mm/px · z∈[-58,+30]mm · 9 of 19 slices shown (2 of 3)]
[im 1/19]
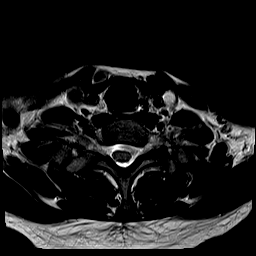
[im 3/19]
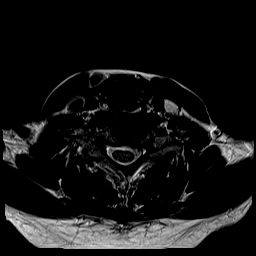
[im 5/19]
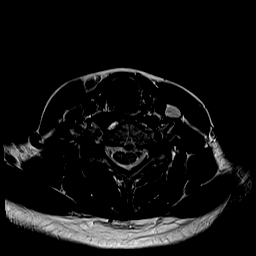
[im 7/19]
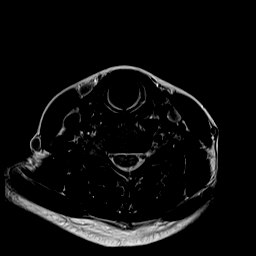
[im 10/19]
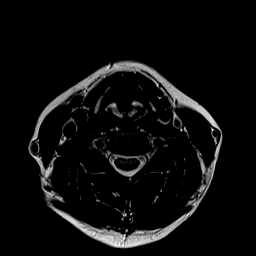
[im 12/19]
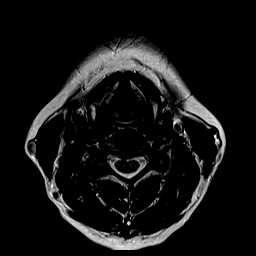
[im 14/19]
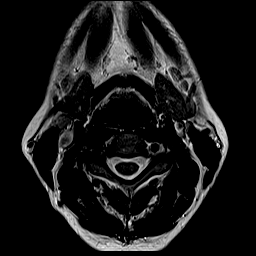
[im 16/19]
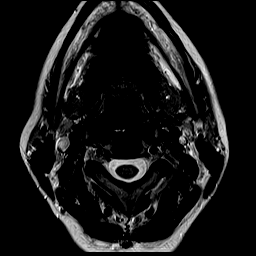
[im 19/19]
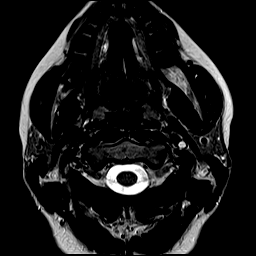

[Series 6: T2 · axial · 4.0mm · 0.59mm/px · z∈[-58,+30]mm · 9 of 19 slices shown (3 of 3)]
[im 1/19]
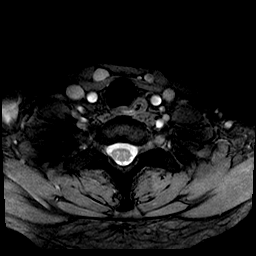
[im 3/19]
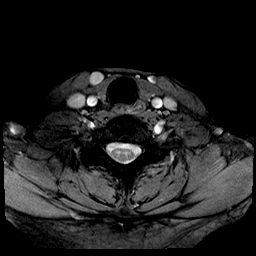
[im 5/19]
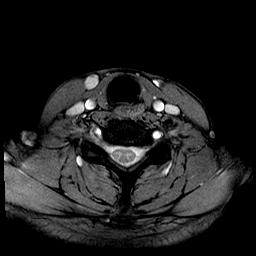
[im 7/19]
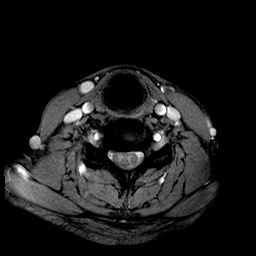
[im 10/19]
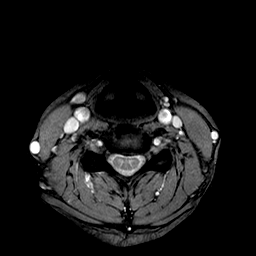
[im 12/19]
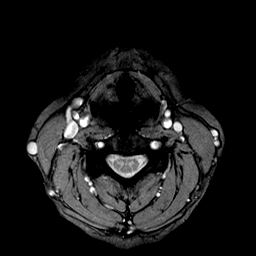
[im 14/19]
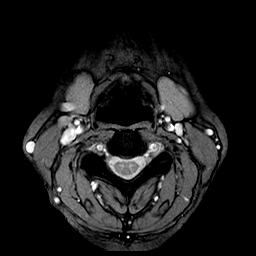
[im 16/19]
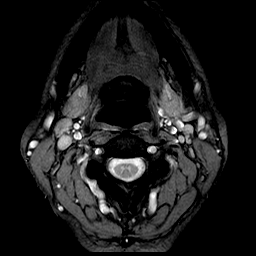
[im 19/19]
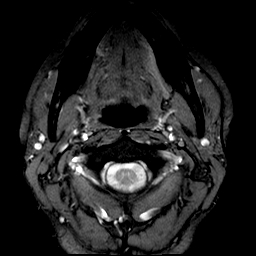

[Series 7: T1 · axial · 4.0mm · 0.59mm/px · z∈[-58,+30]mm · 8 of 19 slices shown (2 of 2)]
[im 1/19]
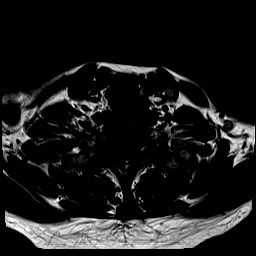
[im 3/19]
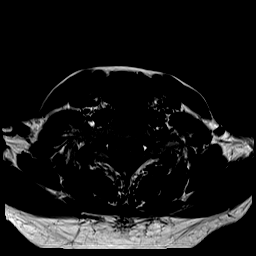
[im 5/19]
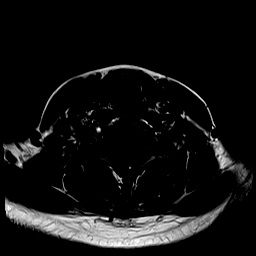
[im 7/19]
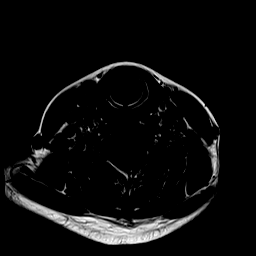
[im 12/19]
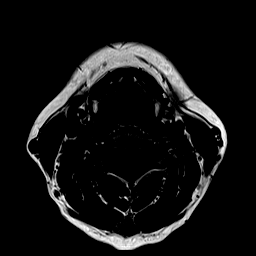
[im 14/19]
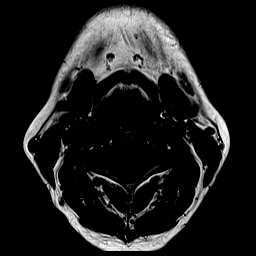
[im 16/19]
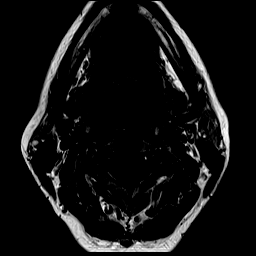
[im 19/19]
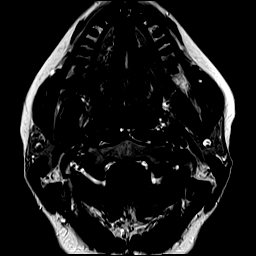

[Series 8: STIR · sagittal · 3.0mm · 0.47mm/px · 2 of 15 slices shown]
[im 1/15]
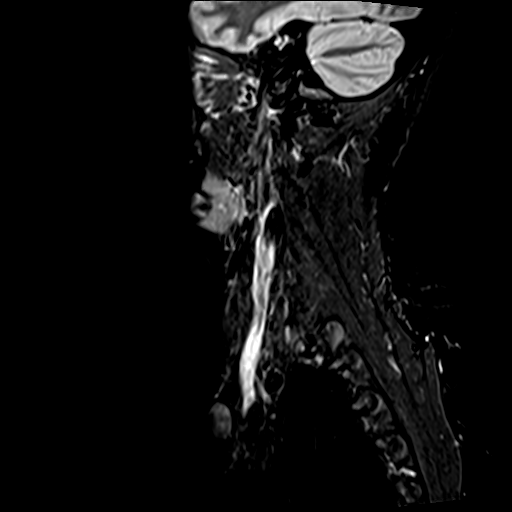
[im 3/15]
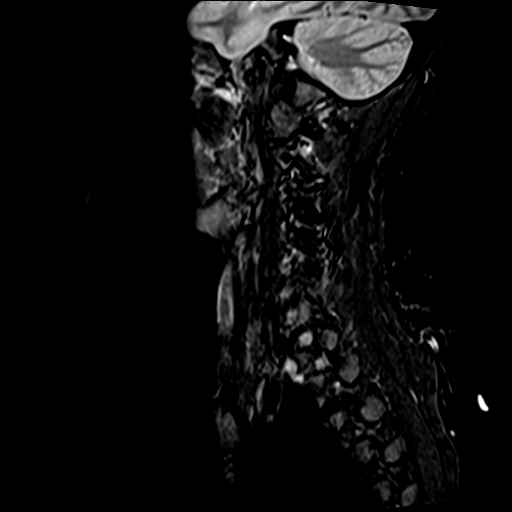

[42 of 48 positions shown; findings below may reference images not displayed]

FINDINGS: C2-C3 is normal.  

A tiny midline bulge at C3-C4 produces neither canal nor foraminal stenosis at that level.  Image 7 series 2, image 6 series 6. 

C4-C5 is normal.  

Slight intradiscal degenerative change is present within C5-C6 associated with a right posterolateral spur that extends into the right C5-C6 foramen potentially trapping the exiting right C6 nerve root.  There is no significant neural canal or left foraminal stenosis at C5-C6.  Image 13 of series 6 and 4, image 12 of series 6, image 6 of series 2. 

At C6-C7, there is mild left foraminal stenosis due to uncovertebral joint hypertrophy. There is neither canal nor right foraminal stenosis at C6-C7.  Image 15 series 6.  

C7-T1 is normal.
IMPRESSION: 1. Abnormal due to significant right foraminal stenosis at C5-C6 that is secondary to a spur arising from the lateral aspect of the right C6 body and the right uncovertebral join.  It potentially traps the exiting right C6 root in the foramen.  

2. Left foraminal stenosis at C6-C7 mild to moderate in degree.
